# Patient Record
Sex: Female | Born: 1935 | Race: White | Hispanic: No | Marital: Married | Smoking: Never smoker
Health system: Southern US, Community
[De-identification: ages and names within clinical notes are randomized; demographics above are authoritative.]

## PROBLEM LIST (undated history)

## (undated) DIAGNOSIS — K219 Gastro-esophageal reflux disease without esophagitis: Secondary | ICD-10-CM

## (undated) DIAGNOSIS — B019 Varicella without complication: Secondary | ICD-10-CM

## (undated) DIAGNOSIS — I10 Essential (primary) hypertension: Secondary | ICD-10-CM

## (undated) DIAGNOSIS — M1990 Unspecified osteoarthritis, unspecified site: Secondary | ICD-10-CM

## (undated) HISTORY — DX: Varicella without complication: B01.9

## (undated) HISTORY — PX: COLONOSCOPY: SHX174

## (undated) HISTORY — DX: Gastro-esophageal reflux disease without esophagitis: K21.9

## (undated) HISTORY — DX: Essential (primary) hypertension: I10

## (undated) HISTORY — PX: ESOPHAGOGASTRODUODENOSCOPY: SHX1529

## (undated) HISTORY — DX: Unspecified osteoarthritis, unspecified site: M19.90

---

## 1999-05-17 ENCOUNTER — Encounter: Admission: RE | Admit: 1999-05-17 | Discharge: 1999-05-17 | Payer: Self-pay | Admitting: Sports Medicine

## 1999-06-17 ENCOUNTER — Encounter: Admission: RE | Admit: 1999-06-17 | Discharge: 1999-06-17 | Payer: Self-pay | Admitting: Sports Medicine

## 1999-07-29 ENCOUNTER — Other Ambulatory Visit: Admission: RE | Admit: 1999-07-29 | Discharge: 1999-07-29 | Payer: Self-pay | Admitting: Family Medicine

## 2000-03-03 ENCOUNTER — Encounter: Admission: RE | Admit: 2000-03-03 | Discharge: 2000-03-03 | Payer: Self-pay | Admitting: Family Medicine

## 2000-07-17 ENCOUNTER — Encounter: Admission: RE | Admit: 2000-07-17 | Discharge: 2000-07-17 | Payer: Self-pay | Admitting: Family Medicine

## 2000-07-17 ENCOUNTER — Encounter: Payer: Self-pay | Admitting: Family Medicine

## 2000-07-31 ENCOUNTER — Other Ambulatory Visit: Admission: RE | Admit: 2000-07-31 | Discharge: 2000-07-31 | Payer: Self-pay | Admitting: Family Medicine

## 2001-08-16 ENCOUNTER — Encounter: Payer: Self-pay | Admitting: Family Medicine

## 2001-08-16 ENCOUNTER — Encounter: Admission: RE | Admit: 2001-08-16 | Discharge: 2001-08-16 | Payer: Self-pay | Admitting: Family Medicine

## 2002-01-30 ENCOUNTER — Other Ambulatory Visit: Admission: RE | Admit: 2002-01-30 | Discharge: 2002-01-30 | Payer: Self-pay | Admitting: Family Medicine

## 2002-09-25 ENCOUNTER — Encounter: Admission: RE | Admit: 2002-09-25 | Discharge: 2002-09-25 | Payer: Self-pay | Admitting: Family Medicine

## 2002-09-25 ENCOUNTER — Encounter: Payer: Self-pay | Admitting: Family Medicine

## 2003-07-10 ENCOUNTER — Other Ambulatory Visit: Admission: RE | Admit: 2003-07-10 | Discharge: 2003-07-10 | Payer: Self-pay | Admitting: Family Medicine

## 2003-12-11 ENCOUNTER — Encounter: Admission: RE | Admit: 2003-12-11 | Discharge: 2003-12-11 | Payer: Self-pay | Admitting: Family Medicine

## 2004-08-31 ENCOUNTER — Other Ambulatory Visit: Admission: RE | Admit: 2004-08-31 | Discharge: 2004-08-31 | Payer: Self-pay | Admitting: Family Medicine

## 2005-05-18 ENCOUNTER — Encounter: Admission: RE | Admit: 2005-05-18 | Discharge: 2005-05-18 | Payer: Self-pay | Admitting: Family Medicine

## 2006-07-04 ENCOUNTER — Encounter: Admission: RE | Admit: 2006-07-04 | Discharge: 2006-07-04 | Payer: Self-pay | Admitting: Family Medicine

## 2006-07-25 ENCOUNTER — Other Ambulatory Visit: Admission: RE | Admit: 2006-07-25 | Discharge: 2006-07-25 | Payer: Self-pay | Admitting: Family Medicine

## 2007-01-02 ENCOUNTER — Encounter: Admission: RE | Admit: 2007-01-02 | Discharge: 2007-01-02 | Payer: Self-pay | Admitting: Family Medicine

## 2007-10-03 ENCOUNTER — Encounter: Admission: RE | Admit: 2007-10-03 | Discharge: 2007-10-03 | Payer: Self-pay | Admitting: Family Medicine

## 2008-10-15 ENCOUNTER — Encounter: Admission: RE | Admit: 2008-10-15 | Discharge: 2008-10-15 | Payer: Self-pay | Admitting: Family Medicine

## 2009-12-01 ENCOUNTER — Encounter: Admission: RE | Admit: 2009-12-01 | Discharge: 2009-12-01 | Payer: Self-pay | Admitting: Family Medicine

## 2010-03-30 ENCOUNTER — Ambulatory Visit: Payer: Self-pay | Admitting: Family Medicine

## 2010-03-30 DIAGNOSIS — M546 Pain in thoracic spine: Secondary | ICD-10-CM | POA: Insufficient documentation

## 2010-07-14 ENCOUNTER — Ambulatory Visit: Payer: Self-pay | Admitting: Sports Medicine

## 2010-07-14 DIAGNOSIS — M79609 Pain in unspecified limb: Secondary | ICD-10-CM

## 2010-07-14 DIAGNOSIS — M7750 Other enthesopathy of unspecified foot: Secondary | ICD-10-CM | POA: Insufficient documentation

## 2010-07-14 DIAGNOSIS — M21619 Bunion of unspecified foot: Secondary | ICD-10-CM

## 2010-08-12 ENCOUNTER — Ambulatory Visit: Payer: Self-pay | Admitting: Sports Medicine

## 2010-08-12 ENCOUNTER — Encounter: Payer: Self-pay | Admitting: Sports Medicine

## 2010-09-09 ENCOUNTER — Ambulatory Visit: Payer: Self-pay | Admitting: Sports Medicine

## 2010-12-07 ENCOUNTER — Other Ambulatory Visit: Payer: Self-pay | Admitting: Family Medicine

## 2010-12-07 DIAGNOSIS — Z1239 Encounter for other screening for malignant neoplasm of breast: Secondary | ICD-10-CM

## 2010-12-07 NOTE — Assessment & Plan Note (Signed)
Summary: ORTHOTICS PER Jan Olano,MC   Vital Signs:  Patient profile:   75 year old female BP sitting:   157 / 84  Vitals Entered By: Lillia Pauls CMA (September 09, 2010 10:26 AM)  History of Present Illness: Katie Johnson returns for pain in both feet her old [employer]k orthotics are wearing down addition of MT support on RT helped a lot still these are not enough support for tennis  both bunions hurt p tennis even in older orthoitcs  wants to try custom orthotic  Allergies: No Known Drug Allergies  Physical Exam  General:  Well-developed,well-nourished,in no acute distress; alert,appropriate and cooperative throughout examination Msk:  marked bunions bilat abnormal clluses see as noted below:   Left foot: Marked breakdown of long and transverse arches. Large bunion, also hammering of 2nd toe. Mild amount of swelling over 2nd TMT joint, area also TTP.  Minimal TTP along 2nd today. - Rt foot: breakdown of long & transverse arches. Large bunion.  No significant TTP along tarsals or MTs.   Impression & Recommendations:  Problem # 1:  FOOT PAIN, BILATERAL (ICD-729.5)  will make custom orthotics she has gotten some relief w all interventions we have tried this may let her play tennis and walk w less probs  Patient was fitted for a standard, cushioned, semi-rigid orthotic.  The orthotic was heated and the patient stood on the orthotic blank positioned on the orthotic stand. The patient was positioned in subtalar neutral position and 10 degrees of ankle dorsiflexion in a weight bearing stance. After completion of molding a stable based was applied to the orthotic blank.   The blank was ground to a stable position for weight bearing. size 7 blue swirl base  blue EVA posting  none additional orthotic padding  MT pads  40 mins time  Orders: [employer], each unit (L3002)  Problem # 2:  BUNIONS, BILATERAL (ICD-727.1)  posting in supination to lessen pressure over bunions  bilat  Orders: [employer], each unit (L3002)  Problem # 3:  METATARSALGIA (ICD-726.70)  small MT pads added to each orthotic seem to help lessen forefoot pain  Orders: [employer], each unit (L3002)  reck prin  Complete Medication List: 1)  Neurontin 300 Mg Caps (Gabapentin) .Marland Kitchen.. 1 cap by mouth at bedtime 2)  Tramadol Hcl 50 Mg Tabs (Tramadol hcl) .Marland Kitchen.. 1 tab by mouth three times a day as needed pain   Orders Added: 1)  Est. Patient Level IV [24401] 2)  Orthotic Materials, each unit [L3002]

## 2010-12-07 NOTE — Assessment & Plan Note (Signed)
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 - playing several days in a row  Allergies: No Known Drug Allergies  Physical Exam  General:  Well-developed,well-nourished,in no acute distress; alert,appropriate and cooperative throughout examination Msk:  Marked breakdwon of long and transverse arches LT shows a large bunion w displacement left 2nd hammer toe RT shows a fairly large bunion left hammer  left forefoot shows swelling and TTP directly over 2nd MT shaft distal 1/3 some general swelling at MTPs also TMT DJD is noted Additional Exam:  MSK [place] left foot shows soft tissue swelling there is DJD in TMT and MT joints w effusions noted over shaft there is edema/ no cortical thickening on long view but cap sign on trans view no inc in doppler noted this is area of Max TTP  images saved   Impression & Recommendations:  Problem # 1:  FOOT PAIN, BILATERAL (ICD-729.5)  cont in orthotics as these look good  most pain is now on left and over bunions when shoes are not good  Orders: [place] LIMITED (16109) Foot Orthosis ( Arch Strap/Heel Cup) [phone])  Problem # 2:  METATARSALGIA (ICD-726.70)  left 2nd probably has an early stress rxn on 2nd MT without clear evidence of stress fx at this point  trial with adding arch strap added neuroma pad to orthotics  both these felt like they helped relieve pain  reck 4 wks  Orders: [place] LIMITED (09811) Foot Orthosis ( Arch  Strap/Heel Cup) (B1478)  Problem # 3:  BUNIONS, BILATERAL (ICD-727.1) severe but with limited pain not sure surgery will be great benefit  Complete Medication List: 1)  Neurontin 300 Mg Caps (Gabapentin) .Marland Kitchen.. 1 cap by mouth at bedtime 2)  Tramadol Hcl 50 Mg Tabs (Tramadol hcl) .Marland Kitchen.. 1 tab by mouth three times a day as needed pain  Patient Instructions: 1)  Make sure you get 800 International Units Vit D once daily 2)  make sure you get 1500 mgm calcium daily 3)  get 500 mgm of vit C daily 4)  use arch strap over Left foot for 6 weeks as tolerated 5)  use orthotics when possible 6)  ice foot at end of day 7)  wait 2 weeks before trying any tennis 8)  use bike for x training 9)  reck in 1 month

## 2010-12-07 NOTE — Assessment & Plan Note (Signed)
Summary: R thoracic back pain   Vital Signs:  Patient profile:   75 year old female Height:      65 inches Weight:      155 pounds BMI:     25.89 BP sitting:   159 / 76  Vitals Entered By: Lillia Pauls CMA (Mar 30, 2010 4:06 PM)  History of Present Illness: 75 yo F presents with right sided mid-upper back pain  Patient denies any known injury Pain started about a month ago when golfing but no particular moment of injury Pain is only on right side just medial to scapula. Some association with numbness and tingling over this area and laterally Feels spasm in this area also that is better with activity Worse first thing in morning and after resting after activity Notices when she serves in tennis Tried icing and ibuprofen but this hurts her stomach Husband has some voltaren gel - has tried this which helps some Massage also helping. Not tried any specific stretches/exercises.  Allergies (verified): No Known Drug Allergies  Physical Exam  General:  Well-developed,well-nourished,in no acute distress; alert,appropriate and cooperative throughout examination Msk:  Back: No gross deformity, swelling, bruising.  Some spasm right paraspinal thoracic region just medial to scapula. TTP in this area only. FROM upper extremities. Strength 5/5 BUEs. Reflexes 2+ and equal bilaterally No loss of sensation bilaterally.   Impression & Recommendations:  Problem # 1:  BACK PAIN, THORACIC REGION (ICD-724.1) Assessment New Focus on scapular strengthening and stretches - shown some today and handout provided.  Heat instead of ice.  Massage and can continue to use voltaren gel.  Neurontin at bedtime for nerve irritation/numbness/spasm.  Will allow her to sleep better as well.  Tramadol if she needs something for pain.  Consider PT at f/u in 4-6 weeks if not improving.  No restrictions on activities.  Her updated medication list for this problem includes:    Tramadol Hcl 50 Mg Tabs (Tramadol  hcl) .Marland Kitchen... 1 tab by mouth three times a day as needed pain  Complete Medication List: 1)  Neurontin 300 Mg Caps (Gabapentin) .Marland Kitchen.. 1 cap by mouth at bedtime 2)  Tramadol Hcl 50 Mg Tabs (Tramadol hcl) .Marland Kitchen.. 1 tab by mouth three times a day as needed pain  Patient Instructions: 1)  Use heat for 15 minutes at a time 3-4 times a day over the area. 2)  Continue using the voltaren gel up to 4 times a day. 3)  Massage will help break up the spasm. 4)  Try neurontin 300mg  at bedtime only to help block the nerve irritation you are getting with this. 5)  Stretching is important - do stretches where you feel it in this area and hold for 20-30 seconds - do 3 of these at least once a day. 6)  Bring shoulder blades together to strengthen these muscles - do 3 sets of 10 once a day. 7)  Physical therapy is an option if you are not making headway with home exercises and stretches. 8)  Follow up with [place] in 4-6 weeks to see how you are doing with this. Prescriptions: TRAMADOL HCL 50 MG TABS (TRAMADOL HCL) 1 tab by mouth three times a day as needed pain  #90 x 1   Entered and Authorized by:   Norton Blizzard MD   Signed by:   Norton Blizzard MD on 03/30/2010   Method used:   Print then Give to Patient   RxID:   [id number] NEURONTIN 300 MG CAPS (GABAPENTIN)  1 cap by mouth at bedtime  #30 x 2   Entered and Authorized by:   Norton Blizzard MD   Signed by:   Norton Blizzard MD on 03/30/2010   Method used:   Print then Give to Patient   RxID:   [id number]

## 2010-12-07 NOTE — Assessment & Plan Note (Signed)
Summary: f/u feet pain   Vital Signs:  Patient profile:   75 year old female Pulse rate:   66 / minute BP sitting:   155 / 81  (right arm)  Vitals Entered By: Rochele Pages RN (August 12, 2010 9:43 AM) CC: f/u bilat foot pain- improved 80%   CC:  f/u bilat foot pain- improved 80%.  History of Present Illness: 75yo female to office for f/u foot pain.  Diagnosed with possible early stress rxn/early stress fx of L 2nd MT.  Unable to wear arch strap b/c caused too much pain.  Unable to tolerate tramadol b/c makes her unsteady on her feet.  Has been taking ibuprofen as needed which seems to help.  Feels she is 73-80% better.  Still with some pain in L foot at night.  Pain now more along 2nd TMT joint, some associated swelling in this area.  Has played tennis twice in the last 2-weeks with only a little pain.  Icing after activity.  Neuroma pads still feel comfortable in her shoes.  Allergies: No Known Drug Allergies  Review of Systems      See HPI  Physical Exam  General:  Well-developed,well-nourished,in no acute distress; alert,appropriate and cooperative throughout examination Msk:  FEET:   - Left foot: Marked breakdown of long and transverse arches. Large bunion, also hammering of 2nd toe. Mild amount of swelling over 2nd TMT joint, area also TTP.  Minimal TTP along 2nd today. - Rt foot: breakdown of long & transverse arches. Large bunion.  No significant TTP along tarsals or MTs.  GAIT:  no limp.  pronation bilaterally. Pulses:  +2/4 DP & PT Neurologic:  sensation intact to light touch.   Additional Exam:  MSK U/S:  Lt foot - small amount of soft tissue swelling noted over 2nd TMT joint.  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  No edema or cortical thickening along 2nd MT.  Spur noted at 2nd MTP joint.  No signs of stress fx or stress rxn today.  No increased doppler flow.  Images saved.   Impression & Recommendations:  Problem # 1:  FOOT PAIN, BILATERAL  (ICD-729.5)  - Continue to use insoles with neuroma pads. - Reviewed proper footwear, as large bunions also contributing to foot pain - Will schedule for custom orthotics for better support while she plays tennis  Orders: Foot Orthosis ( Arch Strap/Heel Cup) [phone])  Problem # 2:  METATARSALGIA (ICD-726.70)  - No signs of stress fx or stress rxn noted on MSK u/s today. - 3 pair of patient's insoles fitted with neuroma pads on left for added support - Will schedule for custom orthotics with leather blank to be placed in her tennis shoes.   - Continue to ice after workouts.  Cont. ibuprofen prn  Orders: Foot Orthosis ( Arch Strap/Heel Cup) [phone])  Problem # 3:  BUNIONS, BILATERAL (ICD-727.1)  - Again reviewed proper footwear.  Orders: Foot Orthosis ( Arch Strap/Heel Cup) [phone])  Complete Medication List: 1)  Neurontin 300 Mg Caps (Gabapentin) .Marland Kitchen.. 1 cap by mouth at bedtime 2)  Tramadol Hcl 50 Mg Tabs (Tramadol hcl) .Marland Kitchen.. 1 tab by mouth three times a day as needed pain

## 2010-12-28 ENCOUNTER — Ambulatory Visit
Admission: RE | Admit: 2010-12-28 | Discharge: 2010-12-28 | Disposition: A | Discharge status: Home / Self Care | Payer: Medicare Other | Source: Ambulatory Visit | Attending: Family Medicine | Admitting: Family Medicine

## 2010-12-28 DIAGNOSIS — Z1239 Encounter for other screening for malignant neoplasm of breast: Secondary | ICD-10-CM

## 2011-07-26 ENCOUNTER — Ambulatory Visit: Payer: BC Managed Care – PPO | Admitting: Sports Medicine

## 2011-08-02 ENCOUNTER — Ambulatory Visit: Payer: BC Managed Care – PPO | Admitting: Sports Medicine

## 2011-09-15 ENCOUNTER — Ambulatory Visit (INDEPENDENT_AMBULATORY_CARE_PROVIDER_SITE_OTHER): Payer: Medicare Other | Admitting: Sports Medicine

## 2011-09-15 VITALS — BP 130/80

## 2011-09-15 DIAGNOSIS — M65849 Other synovitis and tenosynovitis, unspecified hand: Secondary | ICD-10-CM

## 2011-09-15 DIAGNOSIS — M654 Radial styloid tenosynovitis [de Quervain]: Secondary | ICD-10-CM

## 2011-09-15 DIAGNOSIS — M659 Synovitis and tenosynovitis, unspecified: Secondary | ICD-10-CM

## 2011-09-15 DIAGNOSIS — M7750 Other enthesopathy of unspecified foot: Secondary | ICD-10-CM

## 2011-09-15 DIAGNOSIS — S6990XA Unspecified injury of unspecified wrist, hand and finger(s), initial encounter: Secondary | ICD-10-CM

## 2011-09-15 DIAGNOSIS — S66809A Unspecified injury of other specified muscles, fascia and tendons at wrist and hand level, unspecified hand, initial encounter: Secondary | ICD-10-CM

## 2011-09-15 MED ORDER — KETOPROFEN POWD: 1.0000 "application " | Freq: Four times a day (QID) | Status: DC | PRN

## 2011-09-15 NOTE — Assessment & Plan Note (Addendum)
A:  De Quervain's tenosynovitis on the R side due to overuse.  P: R wrist loop  to be worn during activity (tennis and bell ringing).  ketoprofen gel TID-QID prn. F/u in 2-3 weeks.

## 2011-09-15 NOTE — Patient Instructions (Signed)
You have DeQuervain's Tenosynovitis  Please use wrist loop for bell ringing or when playing tennis.  It is also ok to wear the brace anytime your wrist is bothering you.  Rub Ketoprofen gel 3-4 times per day on your wrist.  Please follow up with [place] at your convenience to make your new orthotics.

## 2011-09-15 NOTE — Assessment & Plan Note (Signed)
A: improved with consistent use of orthotics.  P: f/u in 2-3 weeks for custom orthotics to wear with dress shoes.

## 2011-09-15 NOTE — Progress Notes (Signed)
  Subjective:    Patient ID: Katie Johnson, female    DOB: 1936/04/17, 75 y.o.   MRN: [id number]  HPI Subjective:    Katie Johnson is an 75 y.o. female who presents for evaluation of right wrist pain. She is R handed. Onset of pain was gradual, starting about 1 year ago. The pain is moderate, worsens with tennis and bell ringing, and is relieved by rest and ibuprofen. There is no associated numbness, tingling, weakness in the hand or wrist. There is no history of injury. There is history of overuse. The patient plays tennis 2x/week. She notices pain during her backhand. She performs her backhand with her thumb extended. Evaluation to date: none. Treatment to date: tramadol which patient no longer takes due to dizziness while taking the medication.   Patient also request another set of orthotics for her dress shoes. She is wearing her orthotics for her tennis shoes which she states have worked out very well. She denies foot pain.   The following portions of the patient's history were reviewed and updated as appropriate: allergies, current medications, past medical history, past social history and problem list.  Review of Systems Constitutional: negative for anorexia, chills, fevers and weight loss   Objective:    BP 130/80 Right wrist:  No bruising. Soft tissue tenderness and swelling at compartment one. Full ROM. Pain illicited by thumb and wrist flexion. Positive Finkelstein's test. Radial pulse normal.    Left wrist:  normal exam, no swelling, tenderness, instability; ligaments intact, full ROM both hands, wrists, and finger joints   Assessment:    De Quervain's tenosynovitis.  on the right side  due to overuse.   Plan:    R wrist loop to be worn during activity (tennis and bell ringing). . NSAIDs per medication orders.    Review of Systems     Objective:   Physical Exam        Assessment & Plan:

## 2011-10-05 ENCOUNTER — Encounter: Payer: Self-pay | Admitting: Sports Medicine

## 2011-10-05 ENCOUNTER — Ambulatory Visit (INDEPENDENT_AMBULATORY_CARE_PROVIDER_SITE_OTHER): Payer: Medicare Other | Admitting: Sports Medicine

## 2011-10-05 VITALS — BP 150/86 | HR 71

## 2011-10-05 DIAGNOSIS — M79609 Pain in unspecified limb: Secondary | ICD-10-CM

## 2011-10-05 DIAGNOSIS — M21619 Bunion of unspecified foot: Secondary | ICD-10-CM

## 2011-10-05 DIAGNOSIS — M654 Radial styloid tenosynovitis [de Quervain]: Secondary | ICD-10-CM

## 2011-10-05 DIAGNOSIS — M7750 Other enthesopathy of unspecified foot: Secondary | ICD-10-CM

## 2011-10-05 NOTE — Assessment & Plan Note (Signed)
New orthotics were formed for her additional sports shoes and walking gait was corrected to neutral and comfortable after these were completed.  Patient was fitted for a standard, cushioned, semi-rigid orthotic.  The orthotic was heated and the patient stood on the orthotic blank positioned on the orthotic stand. The patient was positioned in subtalar neutral position and 10 degrees of ankle dorsiflexion in a weight bearing stance. After molding, a stable [employer] EVA base was applied to the orthotic blank.   The blank was ground to a stable position for weight bearing. Size: 8 blue swirl base: Blue EVA posting: MT pads additional orthotic padding: none  Time 45 mins

## 2011-10-05 NOTE — Assessment & Plan Note (Signed)
If this continues to control her pain well she can return when necessary or when she needs replacement of her orthotics

## 2011-10-05 NOTE — Assessment & Plan Note (Signed)
We fitted metatarsal pads and multiple pairs of shoes and she was able to get good comfort with walking in these.

## 2011-10-05 NOTE — Patient Instructions (Signed)
Continue using MT pads  Use orthotics in all sports shoes and shoes that will allow fit We made ketoprofen gel refillable for 1 year Prn return

## 2011-10-05 NOTE — Assessment & Plan Note (Signed)
Much improved today but I would recommend that she continue using the topical ketoprofen and the wrist loop for at least the next 4 weeks

## 2011-10-05 NOTE — Progress Notes (Signed)
  Subjective:    Patient ID: Katie Johnson, female    DOB: [birth year], 75 y.o.   MRN: [id number]  HPI Patient returns for 2 issues but primarily for new orthotics. Her bilateral bunions are painful in all of her shoes she wears orthotics. In addition she finds that metatarsal pads are necessary for her to be able to wear any type of dress shoe wear she will get persistent forefoot pain.  We saw her recently with dequervain's tenosynovitis. She has been using a wrist loop and topical ketoprofen gel. This has relieved all of her pain and she is back to playing tennis regularly.   Review of Systems     Objective:   Physical Exam  No acute distress  Right wrist is now nonpainful to palpation and there is no swelling over the first compartment. She can do full wrist motion and a Finkelstein's test without pain.  Foot reveals significant bilateral bunions. She has collapse of the transverse arch and abnormal calcium, metatarsal areas. Walking gait is significantly pronated without orthotics in place. The longitudinal arch shows moderate collapse.      Assessment & Plan:

## 2012-09-18 ENCOUNTER — Ambulatory Visit (INDEPENDENT_AMBULATORY_CARE_PROVIDER_SITE_OTHER): Payer: Medicare Other | Admitting: Sports Medicine

## 2012-09-18 ENCOUNTER — Encounter: Payer: Self-pay | Admitting: Sports Medicine

## 2012-09-18 VITALS — BP 126/77 | HR 59

## 2012-09-18 DIAGNOSIS — M21619 Bunion of unspecified foot: Secondary | ICD-10-CM

## 2012-09-18 DIAGNOSIS — M7750 Other enthesopathy of unspecified foot: Secondary | ICD-10-CM

## 2012-09-18 NOTE — Progress Notes (Signed)
Patient ID: Katie Johnson, female   DOB: [birth year], 76 y.o.   MRN: [id number]  Patient with more RT forefoot pain This follows tennis tournament and standing to play hand bells Wears orthotics in all shoes Tries MT pads in dress shoes  Also some RT lateral post thigh pain p tennis Feeling better with some stretches  PE Marked bunions bilat On Rt tender at medial arch tendr over bunion MT area ttp bilat  Both long and transverse arch are collapsed

## 2012-09-18 NOTE — Assessment & Plan Note (Signed)
Added some heel wedging to RT to lessen stress along med arch and bunion  Felt less pain w this

## 2012-09-18 NOTE — Assessment & Plan Note (Signed)
On RT foot I expanded to use MT cookie Cont med MT pad on left  dress shoes also padded  Much less pain with this

## 2012-11-30 ENCOUNTER — Other Ambulatory Visit: Payer: Self-pay | Admitting: Family Medicine

## 2012-11-30 DIAGNOSIS — Z1231 Encounter for screening mammogram for malignant neoplasm of breast: Secondary | ICD-10-CM

## 2013-01-02 ENCOUNTER — Ambulatory Visit
Admission: RE | Admit: 2013-01-02 | Discharge: 2013-01-02 | Disposition: A | Discharge status: Home / Self Care | Payer: Medicare Other | Source: Ambulatory Visit | Attending: Family Medicine | Admitting: Family Medicine

## 2013-04-08 ENCOUNTER — Emergency Department (HOSPITAL_BASED_OUTPATIENT_CLINIC_OR_DEPARTMENT_OTHER)
Admission: EM | Admit: 2013-04-08 | Discharge: 2013-04-08 | Disposition: A | Discharge status: Home / Self Care | Payer: Medicare Other | Attending: Emergency Medicine | Admitting: Emergency Medicine

## 2013-04-08 ENCOUNTER — Encounter (HOSPITAL_BASED_OUTPATIENT_CLINIC_OR_DEPARTMENT_OTHER): Payer: Self-pay | Admitting: *Deleted

## 2013-04-08 ENCOUNTER — Emergency Department (HOSPITAL_BASED_OUTPATIENT_CLINIC_OR_DEPARTMENT_OTHER): Payer: Medicare Other

## 2013-04-08 DIAGNOSIS — R002 Palpitations: Secondary | ICD-10-CM

## 2013-04-08 DIAGNOSIS — R079 Chest pain, unspecified: Secondary | ICD-10-CM

## 2013-04-08 DIAGNOSIS — I10 Essential (primary) hypertension: Secondary | ICD-10-CM | POA: Insufficient documentation

## 2013-04-08 DIAGNOSIS — Z79899 Other long term (current) drug therapy: Secondary | ICD-10-CM | POA: Insufficient documentation

## 2013-04-08 DIAGNOSIS — R0789 Other chest pain: Secondary | ICD-10-CM | POA: Insufficient documentation

## 2013-04-08 HISTORY — DX: Essential (primary) hypertension: I10

## 2013-04-08 LAB — CBC WITH DIFFERENTIAL/PLATELET
Eosinophils Relative: 3 % (ref 0–5)
Lymphocytes Relative: 38 % (ref 12–46)
Lymphs Abs: 3.1 10*3/uL (ref 0.7–4.0)
MCH: 33.3 pg (ref 26.0–34.0)
MCHC: 35.4 g/dL (ref 30.0–36.0)
MCV: 94 fL (ref 78.0–100.0)
Monocytes Relative: 9 % (ref 3–12)
Neutro Abs: 4.1 10*3/uL (ref 1.7–7.7)
Platelets: 173 10*3/uL (ref 150–400)
RBC: 4.36 MIL/uL (ref 3.87–5.11)
RDW: 12.3 % (ref 11.5–15.5)
WBC: 8.2 10*3/uL (ref 4.0–10.5)

## 2013-04-08 LAB — BASIC METABOLIC PANEL
CO2: 20 mEq/L (ref 19–32)
Chloride: 100 mEq/L (ref 96–112)
Creatinine, Ser: 0.8 mg/dL (ref 0.50–1.10)
Glucose, Bld: 125 mg/dL — ABNORMAL HIGH (ref 70–99)
Potassium: 3.7 mEq/L (ref 3.5–5.1)

## 2013-04-08 LAB — TROPONIN I: Troponin I: <0.3 ng/mL (ref <0.30)

## 2013-04-08 NOTE — ED Provider Notes (Signed)
History    This chart was scribed for Dione Booze, MD by Donne Anon, ED Scribe. This patient was seen in room MH07/MH07 and the patient's care was started at 1840.   CSN: [id number]  Arrival date & time 04/08/13  4782   First MD Initiated Contact with Patient 04/08/13 1840      Chief Complaint  Patient presents with  . Chest Pain    The history is provided by the patient. No language interpreter was used.   HPI Comments: Katie Johnson is a 77 y.o. female with hx HTN, who presents to the Emergency Department complaining of sudden onset  CP (rated 5/10) that radiates to her left ribs and shoulder and is described as pressure and has since resolved. She states the episodes began at 5:30 PM and lasted approximately 1 hour. She denies any modifying factors. She took an Aspirin at the time of the episode. She states her pulse rate was 157 (measured on a at home blood pressure device), but her BP was normal. Her chest pain/pressure resolved when her heart stopped racing. She reports associated flatulence. She states she felt like she had to take deep breaths in order to "get air in." She denies nausea, vomiting, diaphoresis or any other pain. She denies smoking but reports she occasionally drinks wine. She states she is otherwise healthy.  Her PCP is Dr. Everlene Other.   No past medical history on file.  No past surgical history on file.  No family history on file.  History  Substance Use Topics  . Smoking status: Never Smoker   . Smokeless tobacco: Not on file  . Alcohol Use: Not on file     Review of Systems  Constitutional: Negative for chills and diaphoresis.  Cardiovascular: Positive for chest pain.  Gastrointestinal: Negative for nausea and vomiting.  All other systems reviewed and are negative.    Allergies  Ketoprofen  Home Medications   Current Outpatient Rx  Name  Route  Sig  Dispense  Refill  . amLODipine-benazepril (LOTREL) 5-10 MG per capsule   Oral   Take 1 tablet  by mouth daily.         . fish oil-omega-3 fatty acids 1000 MG capsule   Oral   Take 1 g by mouth daily.           . hydrochlorothiazide (HYDRODIURIL) 25 MG tablet   Oral   Take 12.5 mg by mouth daily.         . Ketoprofen POWD   Does not apply   1 application by Does not apply route 4 (four) times daily as needed. 20% gel to use TID-QID. Apply to affected area of R wrist.   60 g   prn   . vitamin E 400 UNIT capsule   Oral   Take 400 Units by mouth daily.             BP 162/85  Pulse 87  Temp(Src) 98 F (36.7 C) (Oral)  Resp 18  Ht 5\' 5"  (1.651 m)  Wt 158 lb (71.668 kg)  BMI 26.29 kg/m2  SpO2 97%  Physical Exam  Nursing note and vitals reviewed. Constitutional: She is oriented to person, place, and time. She appears well-developed and well-nourished. No distress.  HENT:  Head: Normocephalic and atraumatic.  Eyes: EOM are normal.  Neck: Neck supple. No tracheal deviation present.  Cardiovascular: Normal rate, regular rhythm and normal heart sounds.   Pulmonary/Chest: Effort normal and breath sounds normal. No respiratory  distress.  Abdominal: Soft.  Musculoskeletal: Normal range of motion.  Neurological: She is alert and oriented to person, place, and time.  Skin: Skin is warm and dry.  Psychiatric: She has a normal mood and affect. Her behavior is normal.    ED Course  Procedures (including critical care time) DIAGNOSTIC STUDIES: Oxygen Saturation is 97% on RA, adequate by my interpretation.    COORDINATION OF CARE: 7:01 PM Discussed treatment plan which includes labs and CXR with pt at bedside and pt agreed to plan.   Results for orders placed during the hospital encounter of 04/08/13  CBC WITH DIFFERENTIAL      Result Value Range   WBC 8.2  4.0 - 10.5 K/uL   RBC 4.36  3.87 - 5.11 MIL/uL   Hemoglobin 14.5  12.0 - 15.0 g/dL   HCT 16.1  09.6 - 04.5 %   MCV 94.0  78.0 - 100.0 fL   MCH 33.3  26.0 - 34.0 pg   MCHC 35.4  30.0 - 36.0 g/dL   RDW 40.9   81.1 - 91.4 %   Platelets 173  150 - 400 K/uL   Neutrophils Relative % 50  43 - 77 %   Neutro Abs 4.1  1.7 - 7.7 K/uL   Lymphocytes Relative 38  12 - 46 %   Lymphs Abs 3.1  0.7 - 4.0 K/uL   Monocytes Relative 9  3 - 12 %   Monocytes Absolute 0.7  0.1 - 1.0 K/uL   Eosinophils Relative 3  0 - 5 %   Eosinophils Absolute 0.3  0.0 - 0.7 K/uL   Basophils Relative 0  0 - 1 %   Basophils Absolute 0.0  0.0 - 0.1 K/uL  BASIC METABOLIC PANEL      Result Value Range   Sodium 137  135 - 145 mEq/L   Potassium 3.7  3.5 - 5.1 mEq/L   Chloride 100  96 - 112 mEq/L   CO2 20  19 - 32 mEq/L   Glucose, Bld 125 (*) 70 - 99 mg/dL   BUN 27 (*) 6 - 23 mg/dL   Creatinine, Ser 7.82  0.50 - 1.10 mg/dL   Calcium 9.7  8.4 - 95.6 mg/dL   GFR calc non Af Amer 71 (*) >90 mL/min   GFR calc Af Amer 82 (*) >90 mL/min  TROPONIN I      Result Value Range   Troponin I <0.30  <0.30 ng/mL  TROPONIN I      Result Value Range   Troponin I <0.30  <0.30 ng/mL   Dg Chest 2 View  04/08/2013   *RADIOLOGY REPORT*  Clinical Data: Chest pain, history of hypertension  CHEST - 2 VIEW  Comparison: None.  Findings: Mild pulmonary hyperexpansion with central bronchitic changes of bilateral upper lobe emphysema.  Cardiac and mediastinal contours within normal limits.  Multilevel degenerative disc changes with exaggerated kyphosis in the mid thoracic spine.  No pulmonary vascular congestion.  Right middle lobe scarring and possible bronchiectasis.  IMPRESSION:  1.  Right middle lobe scarring and probable mild bronchiectasis. While nonspecific, similar findings can be seen in indolent atypical infection such as MAI. 2.  COPD and emphysema   Original Report Authenticated By: Malachy Moan, M.D.      Date: 04/08/2013  Rate: 87  Rhythm: normal sinus rhythm  QRS Axis: normal  Intervals: normal  ST/T Wave abnormalities: normal  Conduction Disutrbances:none  Narrative Interpretation: Left atrial hypertrophy. No prior ECG available for  comparison.  Old EKG Reviewed: none available    1. Chest pain   2. Palpitations       MDM  Chest pain which seems to have been related to an episode of tachycardia-most likely paroxysmal supraventricular tachycardia. Unfortunately, definite diagnosis cannot be made without ECG tracing during episode of tachycardia. She'll be kept in the ED to get a 0 three-hour troponin. She is at very low risk of ACS. I anticipate that she will be referred back to her PCP to consider Holter monitor or event monitor as an outpatient.  Repeat troponin is normal. She will be discharged.  I personally performed the services described in this documentation, which was scribed in my presence. The recorded information has been reviewed and is accurate.         Dione Booze, MD 04/08/13 [phone]

## 2013-04-08 NOTE — ED Notes (Signed)
Pt reports she never experienced pain but did fell chest pressure that radiated to left rib and shoulder areas. Currently denies any discomfort.

## 2013-04-08 NOTE — ED Notes (Signed)
Pt to room 7 in w/c, reports chest "heaviness and pressure" x 5:30 this evening. Denies any nausea, sob or diaphoresis. ekg has already been performed. Rt obtaining iv access while pt being triaged.  Pt state that "heaviness and pressure" have resolved. None present at this time.

## 2013-04-08 NOTE — ED Notes (Signed)
Pt states she took 325mg  of aspirin just before coming to the ed.

## 2014-07-17 ENCOUNTER — Ambulatory Visit: Payer: Medicare Other | Admitting: Sports Medicine

## 2014-08-07 ENCOUNTER — Ambulatory Visit: Payer: Medicare Other | Admitting: Sports Medicine

## 2014-12-09 ENCOUNTER — Other Ambulatory Visit: Payer: Self-pay

## 2014-12-09 DIAGNOSIS — Z1231 Encounter for screening mammogram for malignant neoplasm of breast: Secondary | ICD-10-CM

## 2014-12-12 ENCOUNTER — Encounter (INDEPENDENT_AMBULATORY_CARE_PROVIDER_SITE_OTHER): Payer: Self-pay

## 2014-12-12 ENCOUNTER — Ambulatory Visit
Admission: RE | Admit: 2014-12-12 | Discharge: 2014-12-12 | Disposition: A | Discharge status: Home / Self Care | Payer: 59 | Source: Ambulatory Visit

## 2014-12-12 DIAGNOSIS — Z1231 Encounter for screening mammogram for malignant neoplasm of breast: Secondary | ICD-10-CM

## 2015-07-15 ENCOUNTER — Encounter: Payer: Self-pay | Admitting: Sports Medicine

## 2015-07-15 ENCOUNTER — Ambulatory Visit (INDEPENDENT_AMBULATORY_CARE_PROVIDER_SITE_OTHER): Payer: Medicare Other | Admitting: Sports Medicine

## 2015-07-15 VITALS — BP 171/85 | Ht 65.0 in | Wt 160.0 lb

## 2015-07-15 DIAGNOSIS — M170 Bilateral primary osteoarthritis of knee: Secondary | ICD-10-CM | POA: Diagnosis not present

## 2015-07-15 MED ORDER — DICLOFENAC SODIUM 1 % TD GEL: 2.0000 g | Freq: Four times a day (QID) | TRANSDERMAL | Status: DC

## 2015-07-15 NOTE — Assessment & Plan Note (Signed)
A: pt. With mild - moderate arthritis of both knees. ROM is preserved. No instability at this time. Pain is controlled with  ibuprofen daily in addition to voltaren gel. Pt. Plays tennis, does yoga, and some stationary cycling. Functionality preserved at this time.  - No injections today.  - Continue ibuprofen / tylenol / voltaren as needed for pain.  - sports knee brace with patellar release while playing tennis - Continue to play tennis but increase to twice weekly to improve quad strength and knee stability.  - Home exercises for knee pain / strengthening given.  - Follow up as needed for pain.

## 2015-07-15 NOTE — Progress Notes (Signed)
[facility] Katie Jolly, MD Phone: [phone]  Subjective:   # Bilateral Knee Pain - pt. Here for evaluation of bilateral knee pain.  - she has previously been diagnosed with arthritis of both of her knees, and received CS injection in 02/2015.  - the injections helped for about 2 months, but her pain has returned.  - She comes in today because she would like to know what to do to preserve her knee functionality in the long run in order to avoid requiring surgical intervention.  - She says that she plays tennis once per week, and her knees do not necessarily bother her or swell after tennis.  - She says that her pain is primarily with lifting her knee, climbing stairs, or getting out of the chair.  - She does not have locking, catching, or giving way of her knees.  - She notices crepitus bilaterally.  - She has pain on the lateral side of both knees, and also patellar pain.  - She has never had an injury to either knee, but her right knee is worse in terms of pain than the left.  - She takes 200mg  of ibuprofen daily which helps to control her pain, and she says that she also uses voltaren gel which also helps her pain.  - She does some swimming, and a small amount of stationary biking, she also takes yoga classes.  - her primary goals are preservation of function of her knees while avoiding surgery if at all possible.   All relevant systems were reviewed and were negative unless otherwise noted in the HPI  Past Medical History Reviewed problem list.  Medications- reviewed and updated Current Outpatient Prescriptions  Medication Sig Dispense Refill  . amLODipine-benazepril (LOTREL) 10-20 MG per capsule Take 1 capsule by mouth daily.    Marland Kitchen amLODipine-benazepril (LOTREL) 5-10 MG per capsule Take 1 tablet by mouth daily.    . diclofenac sodium (VOLTAREN) 1 % GEL Apply 2 g topically 4 (four) times daily. 1 Tube 3  . fish oil-omega-3 fatty acids 1000 MG capsule  Take 1 g by mouth daily.      . hydrochlorothiazide (HYDRODIURIL) 25 MG tablet Take 12.5 mg by mouth daily.    . Ketoprofen POWD 1 application by Does not apply route 4 (four) times daily as needed. 20% gel to use TID-QID. Apply to affected area of R wrist. 60 g prn  . vitamin E 400 UNIT capsule Take 400 Units by mouth daily.       No current facility-administered medications for this visit.   Chief complaint-noted No additions to family history Social history- patient is a non smoker  Objective: BP 171/85 mmHg  Ht 5\' 5"  (1.651 m)  Wt 160 lb (72.576 kg)  BMI 26.63 kg/m2 INSPECTION: No gross deformity, appear to have a small effusion proximally, no erythema.  PALPATION: Tender laterally on both knees, patellar tenderness bilaterally, crepitus noted bilaterally, no gross deformity.  RANGE OF MOTION: 0 - 160 degrees.   STRENGTH: 5/5 flexion, extension, as well as hip flexion / extension.  SPECIAL TESTS: Lachman negative, A/P drawer negative.  NEUROVASCULAR STATUS: In tact.   Ultrasound: Performed on bilateral knees. No evidence of effusion. Small meniscus tear medially / bilaterally. Bone spurring on lateral aspects of bilateral knees. Small amount of fluid / swelling noted near the IT insertion on the right (this is where the majority of her pain is), also noted on the right was bone spurring near  the IT band insertion as well. Patellar grooves are shallow with loss of cartilage noted on bilateral knees.   Assessment/Plan:  Degenerative arthritis of knee, bilateral A: pt. With mild - moderate arthritis of both knees. ROM is preserved. No instability at this time. Pain is controlled with  ibuprofen daily in addition to voltaren gel. Pt. Plays tennis, does yoga, and some stationary cycling. Functionality preserved at this time.  - No injections today.  - Continue ibuprofen / tylenol / voltaren as needed for pain.  - sports knee brace with patellar release while playing tennis -  Continue to play tennis but increase to twice weekly to improve quad strength and knee stability.  - Home exercises for knee pain / strengthening given.  - Follow up as needed for pain.     Reviewed and evaluated with resident/ agree with documentation.  Sterling Big, MD

## 2016-04-27 ENCOUNTER — Other Ambulatory Visit: Payer: Self-pay | Admitting: Family Medicine

## 2016-04-27 DIAGNOSIS — Z1231 Encounter for screening mammogram for malignant neoplasm of breast: Secondary | ICD-10-CM

## 2016-05-03 ENCOUNTER — Ambulatory Visit (HOSPITAL_BASED_OUTPATIENT_CLINIC_OR_DEPARTMENT_OTHER): Payer: 59

## 2016-05-05 ENCOUNTER — Ambulatory Visit (HOSPITAL_BASED_OUTPATIENT_CLINIC_OR_DEPARTMENT_OTHER)
Admission: RE | Admit: 2016-05-05 | Discharge: 2016-05-05 | Disposition: A | Discharge status: Home / Self Care | Payer: Medicare Other | Source: Ambulatory Visit | Attending: Family Medicine | Admitting: Family Medicine

## 2016-05-05 ENCOUNTER — Ambulatory Visit: Payer: 59

## 2016-05-05 DIAGNOSIS — R928 Other abnormal and inconclusive findings on diagnostic imaging of breast: Secondary | ICD-10-CM | POA: Insufficient documentation

## 2016-05-05 DIAGNOSIS — Z1231 Encounter for screening mammogram for malignant neoplasm of breast: Secondary | ICD-10-CM | POA: Diagnosis not present

## 2016-05-09 ENCOUNTER — Other Ambulatory Visit: Payer: Self-pay | Admitting: Family Medicine

## 2016-05-09 DIAGNOSIS — R928 Other abnormal and inconclusive findings on diagnostic imaging of breast: Secondary | ICD-10-CM

## 2016-05-18 ENCOUNTER — Ambulatory Visit
Admission: RE | Admit: 2016-05-18 | Discharge: 2016-05-18 | Disposition: A | Discharge status: Home / Self Care | Payer: Medicare Other | Source: Ambulatory Visit | Attending: Family Medicine | Admitting: Family Medicine

## 2016-05-18 DIAGNOSIS — R928 Other abnormal and inconclusive findings on diagnostic imaging of breast: Secondary | ICD-10-CM

## 2017-09-19 ENCOUNTER — Ambulatory Visit (INDEPENDENT_AMBULATORY_CARE_PROVIDER_SITE_OTHER): Payer: Medicare Other | Admitting: Sports Medicine

## 2017-09-19 VITALS — BP 126/74 | Ht 63.0 in | Wt 163.0 lb

## 2017-09-19 DIAGNOSIS — M170 Bilateral primary osteoarthritis of knee: Secondary | ICD-10-CM

## 2017-09-20 ENCOUNTER — Encounter: Payer: Self-pay | Admitting: Sports Medicine

## 2017-09-20 NOTE — Progress Notes (Signed)
   Subjective:    Patient ID: Katie Johnson, female    DOB: 10/28/1936, 81 y.o.   MRN: [id number]  HPI chief complaint: Bilateral knee pain  Patient comes in today complaining of chronic bilateral knee pain. She has a history of bilateral knee DJD. She's previously [facility] [facility]. He has performed 2 cortisone injections. First injection provided about a year's worth of symptom relief. Last injection was performed this past spring and provided only a few weeks of pain relief. She describes stiffness in both knees which is worse when first getting up. She does note some weakness in her knees as well. She tries to exercise to help keep her legs strong but finds this challenging. She's not noticed any swelling. She denies any locking in the knee. No numbness or tingling. No recent trauma. Previous x-rays have shown left greater than right lateral degenerative changes and moderately severe bilateral patellofemoral degenerative changes.  Past medical history is reviewed Medications reviewed Allergies reviewed     Review of Systems As above     Objective:   Physical Exam  Well-developed, well-nourished. No acute distress. Sitting comfortable in exam room. Vital signs reviewed  Examination of each knee shows significant patellofemoral crepitus bilaterally. 1+ boggy synovitis bilaterally. Minimal tenderness to palpation along the lateral joint lines bilaterally. Range of motion 0-120. Good ligamentous stability. Neurovascularly intact distally.  X-rays are as above. I was able to personally review these x-rays which were done at an outside facility. X-rays are dated 03/15/2017.      Assessment & Plan:  Bilateral knee pain secondary to DJD  I had a long talk with the patient regarding her chronic knee pain. Definitive treatment is a total knee arthroplasty but she is not interested in this. Her husband had a total knee arthroplasty done a few years ago and had a poor  postoperative outcome. I think she may benefit from Visco supplementation. She would like to meet with Dr. Terrilee FilesZach Smith to discuss this further. I will refer her to his office and I will give her some isometric quad exercises to start doing daily. Return to my office PRN.

## 2017-10-11 NOTE — Progress Notes (Signed)
Tawana ScaleZach Smith D.O. [facility] [address] [place], [place] [address] Phone: [phone]) [phone] Subjective:    I'm seeing this patient by the request  of:  Draper DO  CC: Bilateral knee pain  BJY:NWGNFAOZHYHPI:Subjective  Katie Johnson is a 81 y.o. female coming in with complaint of bilateral knee pain.  Has been seen by multiple different providers over the course of the last several months or years even.  Patient has had injections secondary to osteoarthritic changes in the knees.  Patient states left is worse than right. She's looking to talk about injection options.  Patient states that it is affecting daily activities.  Sometimes associated with swelling.  Patient states it can wake her up at night.  Increasing instability and affecting daily activities     Patient did have previous x-rays that were independently visualized by me showing severe bilateral patellofemoral degenerative changes and moderate medial space narrowing.  Past Medical History:  Diagnosis Date  . Hypertension    No past surgical history on file. Social History   Socioeconomic History  . Marital status: Married    Spouse name: None  . Number of children: None  . Years of education: None  . Highest education level: None  Social Needs  . Financial resource strain: None  . Food insecurity - worry: None  . Food insecurity - inability: None  . Transportation needs - medical: None  . Transportation needs - non-medical: None  Occupational History  . None  Tobacco Use  . Smoking status: Never Smoker  . Smokeless tobacco: Never Used  Substance and Sexual Activity  . Alcohol use: None  . Drug use: None  . Sexual activity: None  Other Topics Concern  . None  Social History Narrative  . None   Allergies  Allergen Reactions  . Shellfish-Derived Products   . Ketoprofen Rash   No family history on file.   Past medical history, social, surgical and family history all reviewed in electronic medical record.  No  pertanent information unless stated regarding to the chief complaint.   Review of Systems:Review of systems updated and as accurate as of 10/12/17  No headache, visual changes, nausea, vomiting, diarrhea, constipation, dizziness, abdominal pain, skin rash, fevers, chills, night sweats, weight loss, swollen lymph nodes, body aches,  chest pain, shortness of breath, mood changes.  Positive muscle aches, joint swelling  Objective  Blood pressure 136/72, pulse 66, height 5\' 4"  (1.626 m), weight 163 lb (73.9 kg), SpO2 98 %. Systems examined below as of 10/12/17   General: No apparent distress alert and oriented x3 mood and affect normal, dressed appropriately.  HEENT: Pupils equal, extraocular movements intact  Respiratory: Patient's speak in full sentences and does not appear short of breath  Cardiovascular: No lower extremity edema, non tender, no erythema  Skin: Warm dry intact with no signs of infection or rash on extremities or on axial skeleton.  Abdomen: Soft nontender  Neuro: Cranial nerves II through XII are intact, neurovascularly intact in all extremities with 2+ DTRs and 2+ pulses.  Lymph: No lymphadenopathy of posterior or anterior cervical chain or axillae bilaterally.  Gait antalgic gait MSK:  Non tender with full range of motion and good stability and symmetric strength and tone of shoulders, elbows, wrist, hip, and ankles bilaterally.  Knee: Bilateral valgus deformity noted. Large thigh to calf ratio.  Tender to palpation over medial and PF joint line.  ROM full in flexion and extension and lower leg rotation. instability with  valgus force.  painful patellar compression. Patellar glide with moderate crepitus. Patellar and quadriceps tendons unremarkable. Hamstring and quadriceps strength is normal.  After informed written and verbal consent, patient was seated on exam table. Right knee was prepped with alcohol swab and utilizing anterolateral approach, patient's right knee  space was injected with 4:1  marcaine 0.5%: Kenalog 40mg /dL. Patient tolerated the procedure well without immediate complications.  After informed written and verbal consent, patient was seated on exam table. Left knee was prepped with alcohol swab and utilizing anterolateral approach, patient's left knee space was injected with 4:1  marcaine 0.5%: Kenalog 40mg /dL. Patient tolerated the procedure well without immediate complications.     Impression and Recommendations:     This case required medical decision making of moderate complexity.      Note: This dictation was prepared with Dragon dictation along with smaller phrase technology. Any transcriptional errors that result from this process are unintentional.

## 2017-10-12 ENCOUNTER — Encounter: Payer: Self-pay | Admitting: Family Medicine

## 2017-10-12 ENCOUNTER — Ambulatory Visit: Payer: Medicare Other | Admitting: Family Medicine

## 2017-10-12 DIAGNOSIS — M170 Bilateral primary osteoarthritis of knee: Secondary | ICD-10-CM | POA: Diagnosis not present

## 2017-10-12 NOTE — Patient Instructions (Addendum)
Good to see you.  Ice 20 minutes 2 times daily. Usually after activity and before bed. Exercises 3 times a week.  pennsaid pinkie amount topically 2 times daily as needed.  Injected both knees today  Stay active.  Over the counter get  Vitamin D 2000 IU daily  Turmeric 500mg  daily  Tart cherry extract any dose at night See me again in 4-6 weeks and if not better we can do the monovisc or orthovisc  Happy holidays!

## 2017-10-12 NOTE — Assessment & Plan Note (Signed)
Severe arthritic changes in multiple joints.  Patient does have severe arthritis with valgus deformity and some instability on exam today.  Patient was given steroid injections.  In the past has done formal physical therapy, given home exercises at this time, we discussed icing regimen, which activities of doing which wants to avoid.  Patient is to increase activity slowly over the course of the next several days.  We discussed the possibility of Visco supplementation.  Follow-up again in 4 weeks for further evaluation and treatment.

## 2017-11-08 NOTE — Progress Notes (Signed)
Tawana ScaleZach Smith D.O. [facility] [address] [place], [place] [address] Phone: [phone] Subjective:     CC: Bilateral knee follow-up  BJY:NWGNFAOZHYHPI:Subjective  Katie Johnson is a 82 y.o. female coming in with complaint of bilateral knee pain.  Patient has severe osteoarthritic changes of the knees bilaterally.  Patient given injection October 12, 2017 and was to continue with conservative therapy including icing regimen, home exercise, topical anti-inflammatories and proper shoes.  Patient states that her knees are better but her left knee is still bothering her. She said that she is unable to use the left leg to climb stairs.  Patient states that overall she does think she is improved a little bit but not completely.       Past Medical History:  Diagnosis Date  . Hypertension    No past surgical history on file. Social History   Socioeconomic History  . Marital status: Married    Spouse name: Not on file  . Number of children: Not on file  . Years of education: Not on file  . Highest education level: Not on file  Social Needs  . Financial resource strain: Not on file  . Food insecurity - worry: Not on file  . Food insecurity - inability: Not on file  . Transportation needs - medical: Not on file  . Transportation needs - non-medical: Not on file  Occupational History  . Not on file  Tobacco Use  . Smoking status: Never Smoker  . Smokeless tobacco: Never Used  Substance and Sexual Activity  . Alcohol use: Not on file  . Drug use: Not on file  . Sexual activity: Not on file  Other Topics Concern  . Not on file  Social History Narrative  . Not on file   Allergies  Allergen Reactions  . Shellfish-Derived Products   . Ketoprofen Rash   No family history on file.   Past medical history, social, surgical and family history all reviewed in electronic medical record.  No pertanent information unless stated regarding to the chief complaint.   Review of  Systems:Review of systems updated and as accurate as of 11/08/17  No headache, visual changes, nausea, vomiting, diarrhea, constipation, dizziness, abdominal pain, skin rash, fevers, chills, night sweats, weight loss, swollen lymph nodes, body aches, joint swelling,  chest pain, shortness of breath, mood changes.  Positive muscle aches  Objective  There were no vitals taken for this visit. Systems examined below as of 11/08/17   General: No apparent distress alert and oriented x3 mood and affect normal, dressed appropriately.  HEENT: Pupils equal, extraocular movements intact  Respiratory: Patient's speak in full sentences and does not appear short of breath  Cardiovascular: No lower extremity edema, non tender, no erythema  Skin: Warm dry intact with no signs of infection or rash on extremities or on axial skeleton.  Abdomen: Soft nontender  Neuro: Cranial nerves II through XII are intact, neurovascularly intact in all extremities with 2+ DTRs and 2+ pulses.  Lymph: No lymphadenopathy of posterior or anterior cervical chain or axillae bilaterally.  Gait normal with good balance and coordination.  MSK:  Non tender with full range of motion and good stability and symmetric strength and tone of shoulders, elbows, wrist, hip, and ankles bilaterally.  Knee: Bilateral valgus deformity noted. Large thigh to calf ratio.  Worse on the left Tender to palpation over medial and PF joint line.  ROM full in flexion and extension and lower leg rotation. instability with  valgus force.  Worse on the left worse painful patellar compression. Patellar glide with moderate crepitus. Patellar and quadriceps tendons unremarkable. Hamstring and quadriceps strength is normal.     Impression and Recommendations:     This case required medical decision making of moderate complexity.      Note: This dictation was prepared with Dragon dictation along with smaller phrase technology. Any transcriptional  errors that result from this process are unintentional.

## 2017-11-09 ENCOUNTER — Ambulatory Visit: Payer: Medicare Other | Admitting: Family Medicine

## 2017-11-09 ENCOUNTER — Encounter: Payer: Self-pay | Admitting: Family Medicine

## 2017-11-09 DIAGNOSIS — M170 Bilateral primary osteoarthritis of knee: Secondary | ICD-10-CM

## 2017-11-09 NOTE — Patient Instructions (Addendum)
Good to see you  You are doing great  We always have PT, injections and bracing if you need it At this time lets give the vitamins and the exercises a little more time Ice is your friend Exercises 3 times a week.  See me again in 2 months

## 2017-11-09 NOTE — Assessment & Plan Note (Signed)
Spent  25 minutes with patient face-to-face and had greater than 50% of counseling including as described in assessment and plan.  Discussed with patient in great length about different treatment options including formal physical therapy, custom bracing secondary to large left thigh to calf ratio, as well as the possibility of Visco supplementation.  Patient wants to continue with conservative therapy at this point including home exercises, icing regimen.  Patient will come back and see me again in 2 months

## 2017-11-28 DIAGNOSIS — I10 Essential (primary) hypertension: Secondary | ICD-10-CM | POA: Insufficient documentation

## 2017-11-28 NOTE — Progress Notes (Signed)
[facility] [address], [address] [place], [place] [address] [phone] [phone]  Date:  11/29/2017   Name:  Katie Buttonnn B Poli   DOB:  02/16/1936   MRN:  [id number]  PCP:  Pearline Cablesopland, Jessica C, MD    Chief Complaint: Establish Care (Pt here to est care. )   History of Present Illness:  Katie Johnson is a 82 y.o. very pleasant female patient who presents with the following:  Here today as a new patient to establish care History of HTN Per notes, she had been a pt of [facility] but is now volunteering [facility] at the [facility], so decided to change her care to this facility  [facility] and reviewed other notes:  Immunization History  Administered Date(s) Administered  . Flu Vaccine 60mo and up (FLULAVAL, FLUARIX syr) Inactivated Quadrivalent 09/01/2017  . Tdap (ADACEL, BOOSTRIX) 11/20/2013  . influenza (Historical) 08/16/2013, 08/06/2014, 08/14/2015, 08/02/2016  . pneumococcal conjugate 13-valent (PREVNAR) 02/02/2016  . 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  She is also seeing DR. Smith for her MSK issues  She is using tart cherry and tumeric which are helping her with her OA pain  She did have a CMP in October per Dr. 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 She also did diabetes education, and worked with cardiac patients She did her master's thesis on [employer]  cooking She and her husband have been married for 52 years, [place]   They do not have children   She did have a bone density scan a couple of years ago- it showed osteopenia.  She does not want to repeat this as of yet but will plan to have done in the next year or so  She has no CP or SOB with exercise- she enjoys walking, chair volleyball and yoga, and riding an exercise bike  Patient Active Problem List   Diagnosis Date Noted  . Essential hypertension 11/28/2017  . Degenerative arthritis of knee, bilateral 07/15/2015  . De Quervain's disease (tenosynovitis) 09/15/2011  . METATARSALGIA 07/14/2010  . BUNIONS, BILATERAL 07/14/2010  . FOOT PAIN, BILATERAL 07/14/2010  . BACK PAIN, THORACIC REGION 03/30/2010    Past Medical History:  Diagnosis Date  . Hypertension     History reviewed. No pertinent surgical history.  Social History   Tobacco Use  . Smoking status: Never Smoker  . Smokeless tobacco: Never Used  Substance Use Topics  . Alcohol use: Not on file  . Drug use: Not on file    History reviewed. No pertinent family history.  Allergies  Allergen Reactions  . Shellfish-Derived Products   . Ketoprofen Rash    Medication list has been reviewed and updated.  Current Outpatient Medications on File Prior to Visit  Medication Sig Dispense Refill  . amLODipine-benazepril (LOTREL) 10-20 MG per capsule Take  1 capsule by mouth daily.    . diclofenac sodium (VOLTAREN) 1 % GEL Apply 2 g topically 4 (four) times daily. 1 Tube 3  . fish oil-omega-3 fatty acids 1000 MG capsule Take 1 g by mouth daily.      Marland Kitchen amLODipine-benazepril (LOTREL) 5-10 MG per capsule Take 1 tablet by mouth daily.     No current facility-administered medications on file prior to visit.     Review of Systems:  As per HPI- otherwise negative. No fever or chills No CP or SOB No nausea or vomiting No rash noted   Physical Examination: Vitals:   11/29/17 0936  BP: 131/79   Pulse: 72  Temp: 98.2 F (36.8 C)  SpO2: 99%   Vitals:   11/29/17 0936  Weight: 167 lb 3.2 oz (75.8 kg)  Height: 5\' 4"  (1.626 m)   Body mass index is 28.7 kg/m. Ideal Body Weight: Weight in (lb) to have BMI = 25: 145.3  GEN: WDWN, NAD, Non-toxic, A & O x 3, overweight, looks well HEENT: Atraumatic, Normocephalic. Neck supple. No masses, No LAD.  Bilateral TM wnl, oropharynx normal.  PEERL,EOMI.   Ears and Nose: No external deformity. CV: RRR, No M/G/R. No JVD. No thrill. No extra heart sounds. PULM: CTA B, no wheezes, crackles, rhonchi. No retractions. No resp. distress. No accessory muscle use. ABD: S, NT, ND, +BS. No rebound. No HSM. EXTR: No c/c/e NEURO Normal gait.  PSYCH: Normally interactive. Conversant. Not depressed or anxious appearing.  Calm demeanor.  She has reproducible tenderness in her right back at the bra line/ scapula. She also has a small lipoma in the area which I suspect is the "puffing out" she has noted    Assessment and Plan: Thoracic myofascial strain, initial encounter  Essential hypertension - Plan: amLODipine-benazepril (LOTREL) 10-20 MG capsule  Here today to establish care Right trunk strain which is getting better-  She will let me know if not continuing to improve Refilled BP meds today Recheck here in 6 months   Signed Abbe Amsterdam, MD

## 2017-11-29 ENCOUNTER — Ambulatory Visit (INDEPENDENT_AMBULATORY_CARE_PROVIDER_SITE_OTHER): Payer: Medicare Other | Admitting: Family Medicine

## 2017-11-29 ENCOUNTER — Encounter: Payer: Self-pay | Admitting: Family Medicine

## 2017-11-29 VITALS — BP 131/79 | HR 72 | Temp 98.2°F | Ht 64.0 in | Wt 167.2 lb

## 2017-11-29 DIAGNOSIS — I10 Essential (primary) hypertension: Secondary | ICD-10-CM

## 2017-11-29 DIAGNOSIS — S29019A Strain of muscle and tendon of unspecified wall of thorax, initial encounter: Secondary | ICD-10-CM | POA: Diagnosis not present

## 2017-11-29 MED ORDER — AMLODIPINE BESY-BENAZEPRIL HCL 10-20 MG PO CAPS: 1.0000 | ORAL_CAPSULE | Freq: Every day | ORAL | 3 refills | Status: DC

## 2017-11-29 NOTE — Patient Instructions (Addendum)
It was very nice to meet you today-  I refilled your blood pressure medication for a year today Your BP looks good  Please come and see me in about 6 months for a check up and labs I think the pain in your back is muscular- however please do let me know if it does not continue to improve, in that case I can order some x-rays for you

## 2017-12-08 ENCOUNTER — Encounter: Payer: Self-pay | Admitting: Emergency Medicine

## 2017-12-15 ENCOUNTER — Telehealth: Payer: Self-pay

## 2017-12-15 NOTE — Telephone Encounter (Signed)
Returned patient's call in regards to appt next week with Dr. Katrinka BlazingSmith. Left message for her to call back.

## 2017-12-20 NOTE — Progress Notes (Signed)
Tawana ScaleZach Angle Dirusso D.O. [facility] [address] [place], [place] [address] Phone: [phone] Subjective:      CC: Right shoulder pain  BJY:NWGNFAOZHYHPI:Subjective  Katie Johnson is a 82 y.o. female coming in with complaint of right shoulder pain. Golfing, gardening and chair volleyball makes her shoulder hurt worse. She continues to have pain at rest. She said that her scapula area seems to hurt and becomes swollen. She uses some cream on that area when this occurs. She does have radiating pain into the right pec muscle.  Patient was to be more active but is having difficulty.    Past Medical History:  Diagnosis Date  . Arthritis   . Chicken pox   . GERD (gastroesophageal reflux disease)   . High blood pressure   . Hypertension    No past surgical history on file. Social History   Socioeconomic History  . Marital status: Married    Spouse name: None  . Number of children: None  . Years of education: None  . Highest education level: None  Social Needs  . Financial resource strain: None  . Food insecurity - worry: None  . Food insecurity - inability: None  . Transportation needs - medical: None  . Transportation needs - non-medical: None  Occupational History  . None  Tobacco Use  . Smoking status: Never Smoker  . Smokeless tobacco: Never Used  Substance and Sexual Activity  . Alcohol use: None  . Drug use: None  . Sexual activity: None  Other Topics Concern  . None  Social History Narrative  . None   Allergies  Allergen Reactions  . Shellfish-Derived Products   . Ketoprofen Rash   Family History  Problem Relation Age of Onset  . COPD Mother   . Heart disease Mother   . Diabetes Father   . Heart disease Father      Past medical history, social, surgical and family history all reviewed in electronic medical record.  No pertanent information unless stated regarding to the chief complaint.   Review of Systems:Review of systems updated and as accurate as of  12/21/17  No headache, visual changes, nausea, vomiting, diarrhea, constipation, dizziness, abdominal pain, skin rash, fevers, chills, night sweats, weight loss, swollen lymph nodes, body aches, joint swelling,  chest pain, shortness of breath, mood changes.  Positive muscle aches  Objective  Blood pressure 138/74, pulse 70, height 5\' 4"  (1.626 m), weight 168 lb (76.2 kg), SpO2 94 %. Systems examined below as of 12/21/17   General: No apparent distress alert and oriented x3 mood and affect normal, dressed appropriately.  HEENT: Pupils equal, extraocular movements intact  Respiratory: Patient's speak in full sentences and does not appear short of breath  Cardiovascular: No lower extremity edema, non tender, no erythema  Skin: Warm dry intact with no signs of infection or rash on extremities or on axial skeleton.  Abdomen: Soft nontender  Neuro: Cranial nerves II through XII are intact, neurovascularly intact in all extremities with 2+ DTRs and 2+ pulses.  Lymph: No lymphadenopathy of posterior or anterior cervical chain or axillae bilaterally.  Gait normal with good balance and coordination.  MSK:  Non tender with full range of motion and good stability and symmetric strength and tone of  elbows, wrist, hip, and ankles bilaterally.  Severe arthritic changes of the knee and other joints Shoulder: Right Inspection reveals no abnormalities, atrophy or asymmetry. Palpation reveals pain over the acromioclavicular joint ROM is full in all planes.  Rotator cuff strength normal throughout. Mild impingement on the right Speeds and Yergason's tests normal. No labral pathology noted with negative Obrien's, negative clunk and good stability.  Positive crossover Normal scapular function observed. No painful arc and no drop arm sign. No apprehension sign Contralateral shoulder unremarkable  MSK US performed of: Right shoulder  this study was ordered, performed, and interpreted by Terrilee Files  D.O.  Shoulder:   Supraspinatus:  Appears normal on long and transverse views mild degenerative changes noted AC joint: Severe arthritic changes with positive capsular distention Glenohumeral Joint:  Appears normal without effusion. Glenoid Labrum:  Intact without visualized tears. Biceps Tendon:  Appears normal on long and transverse views, no fraying of tendon, tendon located in intertubercular groove, no subluxation with shoulder internal or external rotation. No increased power doppler signal. Impression: Acromioclavicular arthritis  97110; 15 additional minutes spent for Therapeutic exercises as stated in above notes.  This included exercises focusing on stretching, strengthening, with significant focus on eccentric aspects.   Long term goals include an improvement in range of motion, strength, endurance as well as avoiding reinjury. Patient's frequency would include in 1-2 times a day, 3-5 times a week for a duration of 6-12 weeks. Shoulder Exercises that included:  Basic scapular stabilization to include adduction and depression of scapula Scaption, focusing on proper movement and good control Internal and External rotation utilizing a theraband, with elbow tucked at side entire time Rows with theraband which was givne    Proper technique shown and discussed handout in great detail with ATC.  All questions were discussed and answered.     Impression and Recommendations:     This case required medical decision making of moderate complexity.      Note: This dictation was prepared with Dragon dictation along with smaller phrase technology. Any transcriptional errors that result from this process are unintentional.

## 2017-12-21 ENCOUNTER — Encounter: Payer: Self-pay | Admitting: Family Medicine

## 2017-12-21 ENCOUNTER — Ambulatory Visit: Payer: Self-pay

## 2017-12-21 ENCOUNTER — Ambulatory Visit: Payer: Medicare Other | Admitting: Family Medicine

## 2017-12-21 VITALS — BP 138/74 | HR 70 | Ht 64.0 in | Wt 168.0 lb

## 2017-12-21 DIAGNOSIS — M25511 Pain in right shoulder: Secondary | ICD-10-CM | POA: Diagnosis not present

## 2017-12-21 DIAGNOSIS — M19011 Primary osteoarthritis, right shoulder: Secondary | ICD-10-CM

## 2017-12-21 DIAGNOSIS — M19019 Primary osteoarthritis, unspecified shoulder: Secondary | ICD-10-CM | POA: Insufficient documentation

## 2017-12-21 NOTE — Patient Instructions (Signed)
Good to see you.  Ice 20 minutes 2 times daily. Usually after activity and before bed. Exercises 3 times a week.  pennsaid pinkie amount topically 2 times daily as needed.  Keep hands within peripheral vision with all motions.  See me again in 4-6 weeks and we will talk knees and shoulder.

## 2017-12-21 NOTE — Assessment & Plan Note (Signed)
Acromioclavicular arthritis.  Discussed with patient in great length about icing regimen and home exercises.  We discussed which activities to doing which wants to avoid.  Patient was given topical anti-inflammatories.  Worsening symptoms we will consider potential injection and physical therapy.  Patient is in agreement with the plan.

## 2018-01-09 ENCOUNTER — Ambulatory Visit: Payer: Medicare Other | Admitting: Family Medicine

## 2018-01-15 NOTE — Progress Notes (Signed)
[name] [address] [place], [place] [address] Phone: [phone] Subjective:        CC: Bilateral knee pain, right shoulder pain  BJY:NWGNFAOZHY  Katie Johnson is a 82 y.o. female coming in with complaint of bilateral knee pain.  Known to have severe arthritis of the knees bilaterally.  Discussed with patient in great length.  Patient has been doing fairly well with conservative therapy.  Having some mild increase in discomfort recently.  Rates the severity of pain is 7 out of 10.  Patient states that it is not stopping her from any activities.  Has been doing home exercises fairly regularly.  When he to know what else she can possibly do to try to make another 10-15% improvement.  Patient is also having right shoulder pain.  Found to have acromial clavicular arthritis.  Patient is having difficulty doing the home exercises on her own.  Try to stay active but states that she finds the direction somewhat confusing.  Patient still states that when she sleeps on the shoulder or reaches across her body has some difficulty.     Past Medical History:  Diagnosis Date  . Arthritis   . Chicken pox   . GERD (gastroesophageal reflux disease)   . High blood pressure   . Hypertension    No past surgical history on file. Social History   Socioeconomic History  . Marital status: Married    Spouse name: None  . Number of children: None  . Years of education: None  . Highest education level: None  Social Needs  . Financial resource strain: None  . Food insecurity - worry: None  . Food insecurity - inability: None  . Transportation needs - medical: None  . Transportation needs - non-medical: None  Occupational History  . None  Tobacco Use  . Smoking status: Never Smoker  . Smokeless tobacco: Never Used  Substance and Sexual Activity  . Alcohol use: None  . Drug use: None  . Sexual activity: None  Other Topics Concern  . None  Social History Narrative   . None   Allergies  Allergen Reactions  . Shellfish-Derived Products   . Ketoprofen Rash   Family History  Problem Relation Age of Onset  . COPD Mother   . Heart disease Mother   . Diabetes Father   . Heart disease Father      Past medical history, social, surgical and family history all reviewed in electronic medical record.  No pertanent information unless stated regarding to the chief complaint.   Review of Systems:Review of systems updated and as accurate as of 01/16/18  No headache, visual changes, nausea, vomiting, diarrhea, constipation, dizziness, abdominal pain, skin rash, fevers, chills, night sweats, weight loss, swollen lymph nodes, body aches, joint swelling,  chest pain, shortness of breath, mood changes.  Positive muscle aches  Objective  Blood pressure (!) 144/78, pulse 70, height 5\' 4"  (1.626 m), weight 168 lb (76.2 kg), SpO2 98 %. Systems examined below as of 01/16/18   General: No apparent distress alert and oriented x3 mood and affect normal, dressed appropriately.  HEENT: Pupils equal, extraocular movements intact  Respiratory: Patient's speak in full sentences and does not appear short of breath  Cardiovascular: No lower extremity edema, non tender, no erythema  Skin: Warm dry intact with no signs of infection or rash on extremities or on axial skeleton.  Abdomen: Soft nontender  Neuro: Cranial nerves II through XII are  intact, neurovascularly intact in all extremities with 2+ DTRs and 2+ pulses.  Lymph: No lymphadenopathy of posterior or anterior cervical chain or axillae bilaterally.  Gait antalgic MSK:  Non tender with full range of motion and good stability and symmetric strength and tone of , elbows, wrist, hip, and ankles bilaterally.  Knee: Bilateral valgus deformity noted. Large thigh to calf ratio.  Tender to palpation over medial and PF joint line.  ROM full in flexion and extension and lower leg rotation. instability with valgus force.    painful patellar compression. Patellar glide with moderate crepitus. Patellar and quadriceps tendons unremarkable. Hamstring and quadriceps strength is normal.  Right shoulder exam shows the patient does have crepitus with range of motion.  Lacks last 5 degrees in all planes.  Patient though does have 4+ out of 5 strength compared to the contralateral side.  Positive crossover with tenderness over the acromioclavicular joint.  Negative O'Brien's mild impingement noted   Impression and Recommendations:     This case required medical decision making of moderate complexity.      Note: This dictation was prepared with Dragon dictation along with smaller phrase technology. Any transcriptional errors that result from this process are unintentional.

## 2018-01-16 ENCOUNTER — Other Ambulatory Visit: Payer: Self-pay

## 2018-01-16 ENCOUNTER — Ambulatory Visit: Payer: Medicare Other | Admitting: Family Medicine

## 2018-01-16 ENCOUNTER — Encounter: Payer: Self-pay | Admitting: Family Medicine

## 2018-01-16 DIAGNOSIS — M19011 Primary osteoarthritis, right shoulder: Secondary | ICD-10-CM

## 2018-01-16 DIAGNOSIS — M170 Bilateral primary osteoarthritis of knee: Secondary | ICD-10-CM

## 2018-01-16 DIAGNOSIS — G8929 Other chronic pain: Secondary | ICD-10-CM

## 2018-01-16 DIAGNOSIS — M25519 Pain in unspecified shoulder: Secondary | ICD-10-CM

## 2018-01-16 DIAGNOSIS — M25562 Pain in left knee: Principal | ICD-10-CM

## 2018-01-16 DIAGNOSIS — M25561 Pain in right knee: Principal | ICD-10-CM

## 2018-01-16 MED ORDER — DICLOFENAC SODIUM 1 % TD GEL: 2.0000 g | Freq: Four times a day (QID) | TRANSDERMAL | 3 refills | Status: DC

## 2018-01-16 MED ORDER — DICLOFENAC SODIUM 2 % TD SOLN: 2.0000 g | Freq: Two times a day (BID) | TRANSDERMAL | 3 refills | Status: DC

## 2018-01-16 NOTE — Assessment & Plan Note (Signed)
Spent  25 minutes with patient face-to-face and had greater than 50% of counseling including as described above in assessment and plan. Discussed with patient at great length about icing regimen, home exercise, which activities of doing which wants to avoid.  Patient is to increase activity slowly over the course the next several days.  We will send a formal physical therapy.  Worsening symptoms consider injection.

## 2018-01-16 NOTE — Assessment & Plan Note (Signed)
Stable.  Topical anti-inflammatories given.  Discussed icing regimen and home exercises.  Discussed which activities to do which wants to avoid.  Follow-up again 4-6 weeks

## 2018-01-16 NOTE — Patient Instructions (Signed)
Good to see you  You are doing great  We will get you into physical therapy and they will call you  Stay active Add 2000 IU daily of vitamin D  Continue with the voltren gel  See me again in 6 weeks!

## 2018-01-30 ENCOUNTER — Ambulatory Visit: Payer: Medicare Other | Attending: Family Medicine | Admitting: Physical Therapy

## 2018-01-30 ENCOUNTER — Encounter: Payer: Self-pay | Admitting: Physical Therapy

## 2018-01-30 DIAGNOSIS — M25561 Pain in right knee: Secondary | ICD-10-CM | POA: Diagnosis not present

## 2018-01-30 DIAGNOSIS — R29898 Other symptoms and signs involving the musculoskeletal system: Secondary | ICD-10-CM | POA: Diagnosis present

## 2018-01-30 DIAGNOSIS — M25562 Pain in left knee: Secondary | ICD-10-CM | POA: Insufficient documentation

## 2018-01-30 DIAGNOSIS — G8929 Other chronic pain: Secondary | ICD-10-CM | POA: Diagnosis present

## 2018-01-30 DIAGNOSIS — M25511 Pain in right shoulder: Secondary | ICD-10-CM | POA: Insufficient documentation

## 2018-01-30 NOTE — Therapy (Signed)
[facility] [place] [address]  [address] [place], [place], [address] Phone: [phone]   Fax:  [phone]  Physical Therapy Evaluation  Patient Details  Name: Katie Johnson MRN: [id number] Date of Birth: 08/03/1936 Referring Provider: Dr. Antoine PrimasZachary Smith   Encounter Date: 01/30/2018  PT End of Session - 01/30/18 1514    Visit Number  1    Number of Visits  12    Date for PT Re-Evaluation  03/13/18    Authorization Type  UHC Medicare    PT Start Time  1516    PT Stop Time  1603    PT Time Calculation (min)  47 min    Activity Tolerance  Patient tolerated treatment well    Behavior During Therapy  [facility] for tasks assessed/performed       Past Medical History:  Diagnosis Date  . Arthritis   . Chicken pox   . GERD (gastroesophageal reflux disease)   . High blood pressure   . Hypertension     History reviewed. No pertinent surgical history.  There were no vitals filed for this visit.   Subjective Assessment - 01/30/18 1519    Subjective  has referall for B knee pain as well as shoulder pain - would rahter focus on knees curently. Does yoga 1x/week. Had to use ice on knees last night for knee pain. Climbing stairs are "next to impossible" due to pain, as well as squatting down. Transfers are difficult. Denies N&T into legs and feet. Has a history of injections of B knees x 3 - with limited relief. Has a recumbent that is intermittently used.     Pertinent History  HTN    Diagnostic tests  nothing recently    Patient Stated Goals  walk up and down stairs at living community without pain    Currently in Pain?  No/denies         [facility] - 01/30/18 1524      Assessment   Medical Diagnosis  Chronic pain of both knees; Chronic shoulder pain    Referring Provider  Dr. Antoine PrimasZachary Smith    Next MD Visit  03/06/18    Prior Therapy  no      Precautions   Precautions  None      Restrictions   Weight Bearing Restrictions   No      Balance Screen   Has the patient fallen in the past 6 months  No    Has the patient had a decrease in activity level because of a fear of falling?   No    Is the patient reluctant to leave their home because of a fear of falling?   No      Home Environment   Living Environment  Private residence    Living Arrangements  Spouse/significant other    Type of Home  House    Home Access  Level entry    Home Layout  One level    Home Equipment  None      Prior Function   Level of Independence  Independent    Vocation  Retired      IT consultantCognition   Overall Cognitive Status  Within Functional Limits for tasks assessed      Sensation   Light Touch  Appears Intact      Coordination   Gross Motor Movements are Fluid and Coordinated  Yes      Posture/Postural Control   Posture/Postural Control  Postural limitations    Postural Limitations  Rounded Shoulders;Forward head      ROM / Strength   AROM / PROM / Strength  AROM;Strength      AROM   Overall AROM   Within functional limits for tasks performed    Overall AROM Comments  B LE      Strength   Overall Strength Comments  pain at B knee with MMT    Strength Assessment Site  Hip;Knee    Right/Left Hip  Right;Left    Right Hip Flexion  4-/5    Left Hip Flexion  4-/5    Right/Left Knee  Right;Left    Right Knee Flexion  4/5    Right Knee Extension  4/5    Left Knee Flexion  4/5    Left Knee Extension  4/5      Palpation   Patella mobility  B: good mobility laterally - difficult to assess sup/inf mob due to body habitus    Palpation comment  TTP at R adductor group and quad group              No data recorded  Objective measurements completed on examination: See above findings.      [facility] Adult PT Treatment/Exercise - 01/30/18 1524      Exercises   Exercises  Knee/Hip      Knee/Hip Exercises: Seated   Long Arc Quad  Strengthening;Both;10 reps 5 sec hold      Knee/Hip Exercises: Supine   Bridges   Both;10 reps    Straight Leg Raises  Strengthening;Both;10 reps    Straight Leg Raise with External Rotation  Strengthening;Both;10 reps             PT Education - 01/30/18 1514    Education provided  Yes    Education Details  exam findings, POC, HEP    Person(s) Educated  Patient    Methods  Explanation    Comprehension  Verbalized understanding          PT Long Term Goals - 01/30/18 1515      PT LONG TERM GOAL #1   Title  patient to be independent with advanced HEP    Status  New    Target Date  03/13/18      PT LONG TERM GOAL #2   Title  patient to improve B LE strength to >/= 4+/5 for improved function    Status  New    Target Date  03/13/18      PT LONG TERM GOAL #3   Title  patient to demonstrate reciprocal stair navigation up/down 1 flight wihtout pain limiting    Status  New    Target Date  03/13/18      PT LONG TERM GOAL #4   Title  patient to report improved mobility with reduced pain    Status  New    Target Date  03/13/18      PT LONG TERM GOAL #5   Title  shoulder goals TBD             Plan - 01/30/18 1515    Clinical Impression Statement  Katie Johnson is a 55 y/ female presenting to OPPT today regarding complaints of B knee pain and R shoulder pain - of which patient requests to focus on knee pain currently. Patient today with decreased strength of B LE, TTP in thigh and calf musculature as well as reduced functional mobility due to pain. Patient today given  initial HEP with good understanding and carryover. Patient to benefit from skilled PT intervention to address pain and functional mobility limitations.     Clinical Presentation  Stable    Clinical Decision Making  Low    Rehab Potential  Good    PT Treatment/Interventions  ADLs/Self Care Home Management;Cryotherapy;Electrical Stimulation;Iontophoresis 4mg /ml Dexamethasone;Moist Heat;Therapeutic exercise;Therapeutic activities;Functional mobility training;Stair training;Balance  training;Neuromuscular re-education;Ultrasound;Patient/family education;Manual techniques;Vasopneumatic Device;Taping;Dry needling;Passive range of motion    Consulted and Agree with Plan of Care  Patient       Patient will benefit from skilled therapeutic intervention in order to improve the following deficits and impairments:  Pain, Decreased strength, Decreased mobility, Postural dysfunction, Decreased activity tolerance  Visit Diagnosis: Chronic pain of right knee  Chronic pain of left knee  Chronic right shoulder pain  Other symptoms and signs involving the musculoskeletal system     Problem List Patient Active Problem List   Diagnosis Date Noted  . AC (acromioclavicular) arthritis 12/21/2017  . Essential hypertension 11/28/2017  . Degenerative arthritis of knee, bilateral 07/15/2015  . De Quervain's disease (tenosynovitis) 09/15/2011  . METATARSALGIA 07/14/2010  . BUNIONS, BILATERAL 07/14/2010  . FOOT PAIN, BILATERAL 07/14/2010  . BACK PAIN, THORACIC REGION 03/30/2010     Kipp Laurence, PT, DPT 01/30/18 5:07 PM   [facility] [address]  [address] [place], [place], [address] Phone: [phone]   Fax:  [phone]  Name: Katie Johnson MRN: [id number] Date of Birth: 1935-11-28

## 2018-01-30 NOTE — Patient Instructions (Signed)
Strengthening: Straight Leg Raise (Phase 1)   Tighten muscles on front of right thigh, then lift leg __~8__ inches from surface, keeping knee locked.  Repeat _10-15___ times per set. Do __2__ sets per session.   Straight Leg Raise: With External Leg Rotation    Lie on back with right leg straight, opposite leg bent. Rotate straight leg out and lift __~8__ inches. Repeat __10-15__ times per set. Do __2__ sets per session.   Bridge   Lie back, legs bent. Inhale, pressing hips up. Keeping ribs in, lengthen lower back. Exhale, rolling down along spine from top. Repeat __10-15__ times. Do __2__ sessions per day.  Long [employer] Quad   Straighten operated leg and try to hold it _5___ seconds. Use __0__ lbs on ankle. Repeat _10-15__ times. Do __2__ sessions a day.

## 2018-02-06 ENCOUNTER — Ambulatory Visit: Payer: Medicare Other | Attending: Family Medicine | Admitting: Physical Therapy

## 2018-02-06 ENCOUNTER — Encounter: Payer: Self-pay | Admitting: Physical Therapy

## 2018-02-06 DIAGNOSIS — M25511 Pain in right shoulder: Secondary | ICD-10-CM | POA: Diagnosis present

## 2018-02-06 DIAGNOSIS — R29898 Other symptoms and signs involving the musculoskeletal system: Secondary | ICD-10-CM | POA: Insufficient documentation

## 2018-02-06 DIAGNOSIS — G8929 Other chronic pain: Secondary | ICD-10-CM | POA: Insufficient documentation

## 2018-02-06 DIAGNOSIS — M25561 Pain in right knee: Secondary | ICD-10-CM | POA: Insufficient documentation

## 2018-02-06 DIAGNOSIS — M25562 Pain in left knee: Secondary | ICD-10-CM | POA: Diagnosis present

## 2018-02-06 NOTE — Therapy (Signed)
[facility] [address]  [address] [place], [place], [address] Phone: [phone]   Fax:  [phone]  Physical Therapy Treatment  Patient Details  Name: Katie Johnson MRN: [id number] Date of Birth: 1936-04-07 Referring Provider: Dr. Antoine Primas   Encounter Date: 02/06/2018  PT End of Session - 02/06/18 0857    Visit Number  2    Number of Visits  12    Date for PT Re-Evaluation  03/13/18    Authorization Type  UHC Medicare    PT Start Time  0853    PT Stop Time  0932    PT Time Calculation (min)  39 min    Activity Tolerance  Patient tolerated treatment well    Behavior During Therapy  [facility] for tasks assessed/performed       Past Medical History:  Diagnosis Date  . Arthritis   . Chicken pox   . GERD (gastroesophageal reflux disease)   . High blood pressure   . Hypertension     History reviewed. No pertinent surgical history.  There were no vitals filed for this visit.  Subjective Assessment - 02/06/18 0855    Subjective  knees are "a little on the tender side" - had a rough yoga session yesterday    Pertinent History  HTN    Diagnostic tests  nothing recently    Patient Stated Goals  walk up and down stairs at living community without pain    Currently in Pain?  Yes    Pain Score  2     Pain Location  Knee    Pain Orientation  Right;Left    Pain Descriptors / Indicators  Tender;Sore                       [facility] Adult PT Treatment/Exercise - 02/06/18 0858      Knee/Hip Exercises: Aerobic   Recumbent Bike  L2 x 6 min      Knee/Hip Exercises: Machines for Strengthening   Cybex Leg Press  B LE - 25# x 15      Knee/Hip Exercises: Standing   Functional Squat  10 reps TRX      Knee/Hip Exercises: Seated   Long Arc Quad  Strengthening;Both;15 reps;Weights + ball squeeze - alternating    Long Arc Quad Weight  2 lbs.    Other Seated Knee/Hip Exercises  B LE - fitter - 1 black/1 blue x 15 reps  each side    Hamstring Curl  Strengthening;Both;15 reps    Hamstring Limitations  red tband      Knee/Hip Exercises: Supine   Straight Leg Raises  Strengthening;Both;15 reps 2#    Straight Leg Raise with External Rotation  Strengthening;Both;15 reps 2#                  PT Long Term Goals - 02/06/18 0857      PT LONG TERM GOAL #1   Title  patient to be independent with advanced HEP    Status  On-going      PT LONG TERM GOAL #2   Title  patient to improve B LE strength to >/= 4+/5 for improved function    Status  On-going      PT LONG TERM GOAL #3   Title  patient to demonstrate reciprocal stair navigation up/down 1 flight wihtout pain limiting    Status  On-going      PT LONG TERM GOAL #  4   Title  patient to report improved mobility with reduced pain    Status  On-going      PT LONG TERM GOAL #5   Title  shoulder goals TBD    Status  On-going            Plan - 02/06/18 0858    Clinical Impression Statement  [name] doing well today - some knee soreness due to yoga session yesterday. Tolerable to all progressions of strength training today with no issue. Does have a notable difference in strength at B hips, with L LE seemingly weaker during todays session. Will continue to progress towards goals.     PT Treatment/Interventions  ADLs/Self Care Home Management;Cryotherapy;Electrical Stimulation;Iontophoresis 4mg /ml Dexamethasone;Moist Heat;Therapeutic exercise;Therapeutic activities;Functional mobility training;Stair training;Balance training;Neuromuscular re-education;Ultrasound;Patient/family education;Manual techniques;Vasopneumatic Device;Taping;Dry needling;Passive range of motion    Consulted and Agree with Plan of Care  Patient       Patient will benefit from skilled therapeutic intervention in order to improve the following deficits and impairments:  Pain, Decreased strength, Decreased mobility, Postural dysfunction, Decreased activity tolerance  Visit  Diagnosis: Chronic pain of right knee  Chronic pain of left knee  Chronic right shoulder pain  Other symptoms and signs involving the musculoskeletal system     Problem List Patient Active Problem List   Diagnosis Date Noted  . AC (acromioclavicular) arthritis 12/21/2017  . Essential hypertension 11/28/2017  . Degenerative arthritis of knee, bilateral 07/15/2015  . De Quervain's disease (tenosynovitis) 09/15/2011  . METATARSALGIA 07/14/2010  . BUNIONS, BILATERAL 07/14/2010  . FOOT PAIN, BILATERAL 07/14/2010  . BACK PAIN, THORACIC REGION 03/30/2010     Kipp LaurenceStephanie R Jasdeep Dejarnett, PT, DPT 02/06/18 11:18 AM   [facility] [address]  [address] [place], [place], [address] Phone: [phone]   Fax:  [phone]  Name: Katie Johnson MRN: [id number] Date of Birth: 07/04/1936

## 2018-02-08 ENCOUNTER — Encounter: Payer: Self-pay | Admitting: Physical Therapy

## 2018-02-08 ENCOUNTER — Ambulatory Visit: Payer: Medicare Other | Admitting: Physical Therapy

## 2018-02-08 DIAGNOSIS — M25562 Pain in left knee: Secondary | ICD-10-CM

## 2018-02-08 DIAGNOSIS — M25561 Pain in right knee: Secondary | ICD-10-CM | POA: Diagnosis not present

## 2018-02-08 DIAGNOSIS — R29898 Other symptoms and signs involving the musculoskeletal system: Secondary | ICD-10-CM

## 2018-02-08 DIAGNOSIS — G8929 Other chronic pain: Secondary | ICD-10-CM

## 2018-02-08 DIAGNOSIS — M25511 Pain in right shoulder: Secondary | ICD-10-CM

## 2018-02-08 NOTE — Therapy (Signed)
[facility] [address]  [address] [place], [place], [address] Phone: [phone]   Fax:  [phone]  Physical Therapy Treatment  Patient Details  Name: Katie Johnson MRN: [id number] Date of Birth: Apr 01, 1936 Referring Provider: Dr. Antoine Primas   Encounter Date: 02/08/2018  PT End of Session - 02/08/18 1537    Visit Number  3    Number of Visits  12    Date for PT Re-Evaluation  03/13/18    Authorization Type  UHC Medicare    PT Start Time  1534    PT Stop Time  1614    PT Time Calculation (min)  40 min    Activity Tolerance  Patient tolerated treatment well    Behavior During Therapy  [facility] for tasks assessed/performed       Past Medical History:  Diagnosis Date  . Arthritis   . Chicken pox   . GERD (gastroesophageal reflux disease)   . High blood pressure   . Hypertension     History reviewed. No pertinent surgical history.  There were no vitals filed for this visit.  Subjective Assessment - 02/08/18 1535    Subjective  had some increased soreness after last session, but overall feeling better    Pertinent History  HTN    Diagnostic tests  nothing recently    Patient Stated Goals  walk up and down stairs at living community without pain    Currently in Pain?  Yes    Pain Score  1     Pain Location  Knee    Pain Orientation  Right;Left    Pain Descriptors / Indicators  Aching;Sore                       [facility] Adult PT Treatment/Exercise - 02/08/18 1538      Knee/Hip Exercises: Aerobic   Recumbent Bike  L1 x 6 min      Knee/Hip Exercises: Machines for Strengthening   Cybex Knee Extension  attempted with lightest weight - unable    Cybex Knee Flexion  B LE - 20# - 2 x 15      Knee/Hip Exercises: Standing   Hip Flexion  Stengthening;Both;15 reps;Knee bent 2# - high knee marches    Hip Abduction  Stengthening;Both;15 reps;Knee straight 2#    Hip Extension  Stengthening;Both;15 reps;Knee  straight 2#      Knee/Hip Exercises: Seated   Long Arc Quad  Strengthening;Both;15 reps;Weights    Long Arc Quad Weight  2 lbs.      Knee/Hip Exercises: Supine   Bridges  Strengthening;15 reps    Straight Leg Raises  Strengthening;Both;15 reps 2#                  PT Long Term Goals - 02/06/18 0857      PT LONG TERM GOAL #1   Title  patient to be independent with advanced HEP    Status  On-going      PT LONG TERM GOAL #2   Title  patient to improve B LE strength to >/= 4+/5 for improved function    Status  On-going      PT LONG TERM GOAL #3   Title  patient to demonstrate reciprocal stair navigation up/down 1 flight wihtout pain limiting    Status  On-going      PT LONG TERM GOAL #4   Title  patient to report improved mobility with reduced  pain    Status  On-going      PT LONG TERM GOAL #5   Title  shoulder goals TBD    Status  On-going            Plan - 02/08/18 1537    Clinical Impression Statement  Patient doing well today - reports some soreness after last visit - but reports [place] very little support from handrail. Patient tolerable to all strengthening progressions, however, much difficulty/pain with BATCA leg extensions, thus terminated. Will continue to progress towards goals.     PT Treatment/Interventions  ADLs/Self Care Home Management;Cryotherapy;Electrical Stimulation;Iontophoresis 4mg /ml Dexamethasone;Moist Heat;Therapeutic exercise;Therapeutic activities;Functional mobility training;Stair training;Balance training;Neuromuscular re-education;Ultrasound;Patient/family education;Manual techniques;Vasopneumatic Device;Taping;Dry needling;Passive range of motion    Consulted and Agree with Plan of Care  Patient       Patient will benefit from skilled therapeutic intervention in order to improve the following deficits and impairments:  Pain, Decreased strength, Decreased mobility, Postural dysfunction, Decreased  activity tolerance  Visit Diagnosis: Chronic pain of right knee  Chronic pain of left knee  Chronic right shoulder pain  Other symptoms and signs involving the musculoskeletal system     Problem List Patient Active Problem List   Diagnosis Date Noted  . AC (acromioclavicular) arthritis 12/21/2017  . Essential hypertension 11/28/2017  . Degenerative arthritis of knee, bilateral 07/15/2015  . De Quervain's disease (tenosynovitis) 09/15/2011  . METATARSALGIA 07/14/2010  . BUNIONS, BILATERAL 07/14/2010  . FOOT PAIN, BILATERAL 07/14/2010  . BACK PAIN, THORACIC REGION 03/30/2010     Kipp LaurenceStephanie R Othar Curto, PT, DPT 02/08/18 5:51 PM   [facility] [address]  [address] [place], [place], [address] Phone: [phone]   Fax:  [phone]  Name: Katie Johnson MRN: [id number] Date of Birth: 02/23/1936

## 2018-02-12 ENCOUNTER — Encounter: Payer: Self-pay | Admitting: Physical Therapy

## 2018-02-12 ENCOUNTER — Ambulatory Visit: Payer: Medicare Other | Admitting: Physical Therapy

## 2018-02-12 DIAGNOSIS — M25562 Pain in left knee: Secondary | ICD-10-CM

## 2018-02-12 DIAGNOSIS — M25561 Pain in right knee: Secondary | ICD-10-CM | POA: Diagnosis not present

## 2018-02-12 DIAGNOSIS — M25511 Pain in right shoulder: Secondary | ICD-10-CM

## 2018-02-12 DIAGNOSIS — R29898 Other symptoms and signs involving the musculoskeletal system: Secondary | ICD-10-CM

## 2018-02-12 DIAGNOSIS — G8929 Other chronic pain: Secondary | ICD-10-CM

## 2018-02-12 NOTE — Therapy (Signed)
[facility] [address]  [address] [place], [place], [address] Phone: [phone]   Fax:  [phone]  Physical Therapy Treatment  Patient Details  Name: Katie Johnson MRN: [id number] Date of Birth: 1936-01-03 Referring Provider: Dr. Antoine Johnson   Encounter Date: 02/12/2018  PT End of Session - 02/12/18 0853    Visit Number  4    Number of Visits  12    Date for PT Re-Evaluation  03/13/18    Authorization Type  UHC Medicare    PT Start Time  0848    PT Stop Time  0929    PT Time Calculation (min)  41 min    Activity Tolerance  Patient tolerated treatment well    Behavior During Therapy  Katie Johnson for tasks assessed/performed       Past Medical History:  Diagnosis Date  . Arthritis   . Chicken pox   . GERD (gastroesophageal reflux disease)   . High blood pressure   . Hypertension     History reviewed. No pertinent surgical history.  There were no vitals filed for this visit.  Subjective Assessment - 02/12/18 0850    Subjective  doing well today - spent a good but of time gardening over the weekend    Pertinent History  HTN    Diagnostic tests  nothing recently    Patient Stated Goals  walk up and down stairs at living community without pain    Currently in Pain?  Yes    Pain Score  1     Pain Location  Knee    Pain Orientation  Left;Right    Pain Descriptors / Indicators  Sore    Pain Type  Chronic pain                       [facility] Adult PT Treatment/Exercise - 02/12/18 0855      Knee/Hip Exercises: Aerobic   Nustep  L5 x 6 min      Knee/Hip Exercises: Standing   Hip Flexion  Stengthening;Both;10 reps;Knee straight red tband - 2 pole A    Hip ADduction  Strengthening;Both;10 reps red tband - 2 pole A    Hip Abduction  Stengthening;Both;10 reps;Knee straight red tband - 2 pole A    Hip Extension  Stengthening;Both;10 reps;Knee straight red tband - 2 pole A    Functional Squat  10 reps mini - at  chair    Other Standing Knee Exercises  B side stepping - yellow tband x 20 feet      Knee/Hip Exercises: Seated   Long Arc Quad  Strengthening;Both;10 reps yellow tband - L LE only tolerable to 5 reps    Other Seated Knee/Hip Exercises  B LE - fitter - 1 black/1 blue (R LE) 2 blue (L LE) x 15 reps each side                  PT Long Term Goals - 02/06/18 0857      PT LONG TERM GOAL #1   Title  patient to be independent with advanced HEP    Status  On-going      PT LONG TERM GOAL #2   Title  patient to improve B LE strength to >/= 4+/5 for improved function    Status  On-going      PT LONG TERM GOAL #3   Title  patient to demonstrate reciprocal stair navigation up/down 1 flight  wihtout pain limiting    Status  On-going      PT LONG TERM GOAL #4   Title  patient to report improved mobility with reduced pain    Status  On-going      PT LONG TERM GOAL #5   Title  shoulder goals TBD    Status  On-going            Plan - 02/12/18 0853    Clinical Impression Statement  Katie Hatchnn doing well - reports gardening for 6 hours after last session - some knee sorenes today, but with subjective reports of improved fatigue following activity. Most limitations continue with L LAQ, however tolerable to activites to progress this today.     PT Treatment/Interventions  ADLs/Self Care Home Management;Cryotherapy;Electrical Stimulation;Iontophoresis 4mg /ml Dexamethasone;Moist Heat;Therapeutic exercise;Therapeutic activities;Functional mobility training;Stair training;Balance training;Neuromuscular re-education;Ultrasound;Patient/family education;Manual techniques;Vasopneumatic Device;Taping;Dry needling;Passive range of motion    Consulted and Agree with Plan of Care  Patient       Patient will benefit from skilled therapeutic intervention in order to improve the following deficits and impairments:  Pain, Decreased strength, Decreased mobility, Postural dysfunction, Decreased activity  tolerance  Visit Diagnosis: Chronic pain of right knee  Chronic pain of left knee  Chronic right shoulder pain  Other symptoms and signs involving the musculoskeletal system     Problem List Patient Active Problem List   Diagnosis Date Noted  . AC (acromioclavicular) arthritis 12/21/2017  . Essential hypertension 11/28/2017  . Degenerative arthritis of knee, bilateral 07/15/2015  . De Quervain's disease (tenosynovitis) 09/15/2011  . METATARSALGIA 07/14/2010  . BUNIONS, BILATERAL 07/14/2010  . FOOT PAIN, BILATERAL 07/14/2010  . BACK PAIN, THORACIC REGION 03/30/2010     Katie Johnson, PT, DPT 02/12/18 11:29 AM   [facility] [address]  [address] [place], [place], [address] Phone: [phone]   Fax:  [phone]  Name: Katie Johnson MRN: [id number] Date of Birth: 03/22/1936

## 2018-02-14 ENCOUNTER — Ambulatory Visit: Payer: Medicare Other | Admitting: Physical Therapy

## 2018-02-14 ENCOUNTER — Encounter: Payer: Self-pay | Admitting: Physical Therapy

## 2018-02-14 DIAGNOSIS — R29898 Other symptoms and signs involving the musculoskeletal system: Secondary | ICD-10-CM

## 2018-02-14 DIAGNOSIS — G8929 Other chronic pain: Secondary | ICD-10-CM

## 2018-02-14 DIAGNOSIS — M25561 Pain in right knee: Principal | ICD-10-CM

## 2018-02-14 DIAGNOSIS — M25511 Pain in right shoulder: Secondary | ICD-10-CM

## 2018-02-14 DIAGNOSIS — M25562 Pain in left knee: Secondary | ICD-10-CM

## 2018-02-14 NOTE — Therapy (Signed)
[facility] [address]  [address] [place], [place], [address] Phone: [phone]   Fax:  [phone]  Physical Therapy Treatment  Patient Details  Name: Katie Johnson MRN: [id number] Date of Birth: 09/22/1936 Referring Provider: Dr. Antoine PrimasZachary Smith   Encounter Date: 02/14/2018  PT End of Session - 02/14/18 1621    Visit Number  5    Number of Visits  12    Date for PT Re-Evaluation  03/13/18    Authorization Type  UHC Medicare    PT Start Time  1617    PT Stop Time  1701    PT Time Calculation (min)  44 min    Activity Tolerance  Patient tolerated treatment well    Behavior During Therapy  [facility] for tasks assessed/performed       Past Medical History:  Diagnosis Date  . Arthritis   . Chicken pox   . GERD (gastroesophageal reflux disease)   . High blood pressure   . Hypertension     History reviewed. No pertinent surgical history.  There were no vitals filed for this visit.  Subjective Assessment - 02/14/18 1620    Subjective  doing well - feels like she is starting to get better - felt well after doing HEP    Pertinent History  HTN    Diagnostic tests  nothing recently    Patient Stated Goals  walk up and down stairs at living community without pain    Currently in Pain?  Yes    Pain Score  3  during NuStep    Pain Location  Knee    Pain Orientation  Left    Pain Descriptors / Indicators  Aching;Sore;Discomfort    Pain Type  Chronic pain                       [facility] Adult PT Treatment/Exercise - 02/14/18 1622      Knee/Hip Exercises: Aerobic   Nustep  L5 x 6 min      Knee/Hip Exercises: Standing   Other Standing Knee Exercises  fwd monster walks - 2 x 20 feet - yellow tband      Knee/Hip Exercises: Seated   Long Arc Quad  Left;15 reps greater ease    Other Seated Knee/Hip Exercises  B LE - fitter - 1 black/1 blue - 15 reps R LE, 6 reps L LE    Sit to Sand  -- attmepted from multiple levels  - unable       Modalities   Modalities  Iontophoresis      Iontophoresis   Type of Iontophoresis  Dexamethasone    Location  L anterior knee    Dose  1.0 mL    Time  4-6 hours; 80 mA                  PT Long Term Goals - 02/06/18 0857      PT LONG TERM GOAL #1   Title  patient to be independent with advanced HEP    Status  On-going      PT LONG TERM GOAL #2   Title  patient to improve B LE strength to >/= 4+/5 for improved function    Status  On-going      PT LONG TERM GOAL #3   Title  patient to demonstrate reciprocal stair navigation up/down 1 flight wihtout pain limiting    Status  On-going  PT LONG TERM GOAL #4   Title  patient to report improved mobility with reduced pain    Status  On-going      PT LONG TERM GOAL #5   Title  shoulder goals TBD    Status  On-going            Plan - 02/14/18 1622    Clinical Impression Statement  Patient reporting improvements in pain and functional mobility since starting PT - however, still with primary complaints of general rubbing/arthritic pain in B knees limiting her. Patient questioning continuing PT vs injections at this point due to limited tolerance and progression ability. Heavy education on arthitis and disease process. Did apply ionto patch for hopeful pain relief.     PT Treatment/Interventions  ADLs/Self Care Home Management;Cryotherapy;Electrical Stimulation;Iontophoresis 4mg /ml Dexamethasone;Moist Heat;Therapeutic exercise;Therapeutic activities;Functional mobility training;Stair training;Balance training;Neuromuscular re-education;Ultrasound;Patient/family education;Manual techniques;Vasopneumatic Device;Taping;Dry needling;Passive range of motion    Consulted and Agree with Plan of Care  Patient       Patient will benefit from skilled therapeutic intervention in order to improve the following deficits and impairments:  Pain, Decreased strength, Decreased mobility, Postural dysfunction, Decreased  activity tolerance  Visit Diagnosis: Chronic pain of right knee  Chronic pain of left knee  Chronic right shoulder pain  Other symptoms and signs involving the musculoskeletal system     Problem List Patient Active Problem List   Diagnosis Date Noted  . AC (acromioclavicular) arthritis 12/21/2017  . Essential hypertension 11/28/2017  . Degenerative arthritis of knee, bilateral 07/15/2015  . De Quervain's disease (tenosynovitis) 09/15/2011  . METATARSALGIA 07/14/2010  . BUNIONS, BILATERAL 07/14/2010  . FOOT PAIN, BILATERAL 07/14/2010  . BACK PAIN, THORACIC REGION 03/30/2010     Kipp Laurence, PT, DPT 02/14/18 5:21 PM   [facility] [address]  [address] [place], [place], [address] Phone: [phone]   Fax:  [phone]  Name: Katie Johnson MRN: [id number] Date of Birth: May 17, 1936

## 2018-02-14 NOTE — Patient Instructions (Signed)

## 2018-02-19 ENCOUNTER — Ambulatory Visit: Payer: Medicare Other | Admitting: Physical Therapy

## 2018-02-19 ENCOUNTER — Encounter: Payer: Self-pay | Admitting: Physical Therapy

## 2018-02-19 DIAGNOSIS — M25511 Pain in right shoulder: Secondary | ICD-10-CM

## 2018-02-19 DIAGNOSIS — M25561 Pain in right knee: Secondary | ICD-10-CM | POA: Diagnosis not present

## 2018-02-19 DIAGNOSIS — M25562 Pain in left knee: Secondary | ICD-10-CM

## 2018-02-19 DIAGNOSIS — R29898 Other symptoms and signs involving the musculoskeletal system: Secondary | ICD-10-CM

## 2018-02-19 DIAGNOSIS — G8929 Other chronic pain: Secondary | ICD-10-CM

## 2018-02-19 NOTE — Therapy (Signed)
[facility] [address]  [address] [place], [place], [address] Phone: [phone]   Fax:  [phone]  Physical Therapy Treatment  Patient Details  Name: Katie Johnson MRN: [id number] Date of Birth: [birth year] Referring Provider: Dr. Antoine Primas   Encounter Date: 02/19/2018  PT End of Session - 02/19/18 1615    Visit Number  6    Number of Visits  12    Date for PT Re-Evaluation  03/13/18    Authorization Type  UHC Medicare    PT Start Time  1605    PT Stop Time  1645    PT Time Calculation (min)  40 min    Activity Tolerance  Patient tolerated treatment well    Behavior During Therapy  [facility] for tasks assessed/performed       Past Medical History:  Diagnosis Date  . Arthritis   . Chicken pox   . GERD (gastroesophageal reflux disease)   . High blood pressure   . Hypertension     History reviewed. No pertinent surgical history.  There were no vitals filed for this visit.  Subjective Assessment - 02/19/18 1608    Subjective  feels like ionto patch helped some, may elect for another patch later this week as [facility]    Pertinent History  HTN    Diagnostic tests  nothing recently    Patient Stated Goals  walk up and down stairs at living community without pain    Currently in Pain?  Yes    Pain Score  4     Pain Location  Knee    Pain Orientation  Left    Pain Descriptors / Indicators  Aching;Sore    Pain Type  Chronic pain                       [facility] Adult PT Treatment/Exercise - 02/19/18 1616      Knee/Hip Exercises: Aerobic   Nustep  L5 x 6 min      Knee/Hip Exercises: Standing   Lateral Step Up  Right;Left;10 reps;Hand Hold: 2;Step Height: 6"    Forward Step Up  Right;Left;15 reps;Hand Hold: 2;Step Height: 4" + opposite knee drive    Other Standing Knee Exercises  B side stepping - red tband x 25 feet    Other Standing Knee Exercises  fwd/bwd monster walks - red tband x  25 feet each direction      Knee/Hip Exercises: Seated   Hamstring Curl  Strengthening;Both;15 reps    Hamstring Limitations  green tband      Modalities   Modalities  Iontophoresis      Iontophoresis   Type of Iontophoresis  Dexamethasone    Location  L anterior knee    Dose  1.0 mL    Time  4-6 hours; 80 mA                  PT Long Term Goals - 02/06/18 0857      PT LONG TERM GOAL #1   Title  patient to be independent with advanced HEP    Status  On-going      PT LONG TERM GOAL #2   Title  patient to improve B LE strength to >/= 4+/5 for improved function    Status  On-going      PT LONG TERM GOAL #3   Title  patient to demonstrate reciprocal stair navigation  up/down 1 flight wihtout pain limiting    Status  On-going      PT LONG TERM GOAL #4   Title  patient to report improved mobility with reduced pain    Status  On-going      PT LONG TERM GOAL #5   Title  shoulder goals TBD    Status  On-going            Plan - 02/19/18 1616    Clinical Impression Statement  Patient noting good relief from ionto patch last visit - did yoga this morning and had to get on her knees with increased pain. patient tolerable to all strengthening work + reapplication of ionto for pain relief. Making good progress towards goals.     PT Treatment/Interventions  ADLs/Self Care Home Management;Cryotherapy;Electrical Stimulation;Iontophoresis 4mg /ml Dexamethasone;Moist Heat;Therapeutic exercise;Therapeutic activities;Functional mobility training;Stair training;Balance training;Neuromuscular re-education;Ultrasound;Patient/family education;Manual techniques;Vasopneumatic Device;Taping;Dry needling;Passive range of motion    Consulted and Agree with Plan of Care  Patient       Patient will benefit from skilled therapeutic intervention in order to improve the following deficits and impairments:  Pain, Decreased strength, Decreased mobility, Postural dysfunction, Decreased activity  tolerance  Visit Diagnosis: Chronic pain of right knee  Chronic pain of left knee  Chronic right shoulder pain  Other symptoms and signs involving the musculoskeletal system     Problem List Patient Active Problem List   Diagnosis Date Noted  . AC (acromioclavicular) arthritis 12/21/2017  . Essential hypertension 11/28/2017  . Degenerative arthritis of knee, bilateral 07/15/2015  . De Quervain's disease (tenosynovitis) 09/15/2011  . METATARSALGIA 07/14/2010  . BUNIONS, BILATERAL 07/14/2010  . FOOT PAIN, BILATERAL 07/14/2010  . BACK PAIN, THORACIC REGION 03/30/2010    Kipp LaurenceStephanie R Vonceil Upshur, PT, DPT 02/19/18 4:47 PM   [facility] [place] [address]  [address] [place], [place], [address] Phone: [phone]   Fax:  [phone]  Name: Katie Johnson MRN: [id number] Date of Birth: 06/22/1936

## 2018-02-21 ENCOUNTER — Encounter: Payer: Self-pay | Admitting: Physical Therapy

## 2018-02-21 ENCOUNTER — Ambulatory Visit: Payer: Medicare Other | Admitting: Physical Therapy

## 2018-02-21 DIAGNOSIS — R29898 Other symptoms and signs involving the musculoskeletal system: Secondary | ICD-10-CM

## 2018-02-21 DIAGNOSIS — G8929 Other chronic pain: Secondary | ICD-10-CM

## 2018-02-21 DIAGNOSIS — M25561 Pain in right knee: Principal | ICD-10-CM

## 2018-02-21 DIAGNOSIS — M25562 Pain in left knee: Secondary | ICD-10-CM

## 2018-02-21 DIAGNOSIS — M25511 Pain in right shoulder: Secondary | ICD-10-CM

## 2018-02-21 NOTE — Therapy (Signed)
[facility] [place] [address]  [address] [place], [place], [address] Phone: [phone]   Fax:  [phone]  Physical Therapy Treatment  Patient Details  Name: Katie Johnson MRN: [id number] Date of Birth: 02/01/1936 Referring Provider: Dr. Antoine PrimasZachary Smith   Encounter Date: 02/21/2018  PT End of Session - 02/21/18 1537    Visit Number  7    Number of Visits  12    Date for PT Re-Evaluation  03/13/18    Authorization Type  UHC Medicare    PT Start Time  1532    PT Stop Time  1613    PT Time Calculation (min)  41 min    Activity Tolerance  Patient tolerated treatment well    Behavior During Therapy  [facility] for tasks assessed/performed       Past Medical History:  Diagnosis Date  . Arthritis   . Chicken pox   . GERD (gastroesophageal reflux disease)   . High blood pressure   . Hypertension     History reviewed. No pertinent surgical history.  There were no vitals filed for this visit.  Subjective Assessment - 02/21/18 1536    Subjective  felt really well after resting and from ionto patch    Pertinent History  HTN    Diagnostic tests  nothing recently    Patient Stated Goals  walk up and down stairs at living community without pain    Currently in Pain?  Yes    Pain Score  3     Pain Location  Knee    Pain Orientation  Left    Pain Descriptors / Indicators  Aching;Sore;Discomfort    Pain Type  Chronic pain                       [facility] Adult PT Treatment/Exercise - 02/21/18 1538      Knee/Hip Exercises: Aerobic   Nustep  L5 x 7 min      Knee/Hip Exercises: Machines for Strengthening   Cybex Leg Press  B LE - 35# x 15      Knee/Hip Exercises: Standing   Forward Lunges  Right;Left;10 reps fwd/bwd step - 1 UE support    Step Down  Right;Left;10 reps;Hand Hold: 2;Step Height: 4" L LE - mini depth    Other Standing Knee Exercises  resisted fwd/bwd walking with black tband at hips - 2 x 40 feet each direction       Modalities   Modalities  Iontophoresis      Iontophoresis   Type of Iontophoresis  Dexamethasone    Location  L anterior knee    Dose  1.0 mL    Time  4-6 hours; 80 mA                  PT Long Term Goals - 02/06/18 0857      PT LONG TERM GOAL #1   Title  patient to be independent with advanced HEP    Status  On-going      PT LONG TERM GOAL #2   Title  patient to improve B LE strength to >/= 4+/5 for improved function    Status  On-going      PT LONG TERM GOAL #3   Title  patient to demonstrate reciprocal stair navigation up/down 1 flight wihtout pain limiting    Status  On-going      PT LONG TERM GOAL #4   Title  patient to report improved mobility with reduced pain    Status  On-going      PT LONG TERM GOAL #5   Title  shoulder goals TBD    Status  On-going            Plan - 02/21/18 1537    Clinical Impression Statement  Patient conitnues to note benefit from ionto patch - reapplied today. Feels like she is gaining strength and true benefit from PT sessions. PT sesson today focusing on continued quad strengthening, as well as general LE strengthening. Good progress with resisted walking, however, noted L LE weaker during this task. Will continue to progress towards goals.     PT Treatment/Interventions  ADLs/Self Care Home Management;Cryotherapy;Electrical Stimulation;Iontophoresis 4mg /ml Dexamethasone;Moist Heat;Therapeutic exercise;Therapeutic activities;Functional mobility training;Stair training;Balance training;Neuromuscular re-education;Ultrasound;Patient/family education;Manual techniques;Vasopneumatic Device;Taping;Dry needling;Passive range of motion    Consulted and Agree with Plan of Care  Patient       Patient will benefit from skilled therapeutic intervention in order to improve the following deficits and impairments:  Pain, Decreased strength, Decreased mobility, Postural dysfunction, Decreased activity tolerance  Visit  Diagnosis: Chronic pain of right knee  Chronic pain of left knee  Chronic right shoulder pain  Other symptoms and signs involving the musculoskeletal system     Problem List Patient Active Problem List   Diagnosis Date Noted  . AC (acromioclavicular) arthritis 12/21/2017  . Essential hypertension 11/28/2017  . Degenerative arthritis of knee, bilateral 07/15/2015  . De Quervain's disease (tenosynovitis) 09/15/2011  . METATARSALGIA 07/14/2010  . BUNIONS, BILATERAL 07/14/2010  . FOOT PAIN, BILATERAL 07/14/2010  . BACK PAIN, THORACIC REGION 03/30/2010     Kipp Laurence, PT, DPT 02/21/18 4:22 PM   [facility] [address]  [address] [place], [place], [address] Phone: [phone]   Fax:  [phone]  Name: Katie Johnson MRN: [id number] Date of Birth: [birth year]

## 2018-03-05 ENCOUNTER — Ambulatory Visit: Payer: Medicare Other | Admitting: Physical Therapy

## 2018-03-05 ENCOUNTER — Encounter: Payer: Self-pay | Admitting: Physical Therapy

## 2018-03-05 DIAGNOSIS — M25561 Pain in right knee: Secondary | ICD-10-CM | POA: Diagnosis not present

## 2018-03-05 DIAGNOSIS — M25562 Pain in left knee: Secondary | ICD-10-CM

## 2018-03-05 DIAGNOSIS — M25511 Pain in right shoulder: Secondary | ICD-10-CM

## 2018-03-05 DIAGNOSIS — G8929 Other chronic pain: Secondary | ICD-10-CM

## 2018-03-05 DIAGNOSIS — R29898 Other symptoms and signs involving the musculoskeletal system: Secondary | ICD-10-CM

## 2018-03-05 NOTE — Progress Notes (Signed)
[name] [address] [place], [place] [address] Phone: [phone] Subjective:     CC: Bilateral knee pain follow-up  BJY:[id number]  Katie Johnson is a 82 y.o. female coming in with complaint of bilateral knee pain. She said that her pain has improved since doing physical therapy.  Patient is approximately 75% better.  Not as much pain.  Noticing that she has more endurance.  Patient states is much easier getting in and out of the chair as well.  Not having as much discomfort.     Past Medical History:  Diagnosis Date  . Arthritis   . Chicken pox   . GERD (gastroesophageal reflux disease)   . High blood pressure   . Hypertension    No past surgical history on file. Social History   Socioeconomic History  . Marital status: Married    Spouse name: Not on file  . Number of children: Not on file  . Years of education: Not on file  . Highest education level: Not on file  Occupational History  . Not on file  Social Needs  . Financial resource strain: Not on file  . Food insecurity:    Worry: Not on file    Inability: Not on file  . Transportation needs:    Medical: Not on file    Non-medical: Not on file  Tobacco Use  . Smoking status: Never Smoker  . Smokeless tobacco: Never Used  Substance and Sexual Activity  . Alcohol use: Not on file  . Drug use: Not on file  . Sexual activity: Not on file  Lifestyle  . Physical activity:    Days per week: Not on file    Minutes per session: Not on file  . Stress: Not on file  Relationships  . Social connections:    Talks on phone: Not on file    Gets together: Not on file    Attends religious service: Not on file    Active member of club or organization: Not on file    Attends meetings of clubs or organizations: Not on file    Relationship status: Not on file  Other Topics Concern  . Not on file  Social History Narrative  . Not on file   Allergies  Allergen Reactions  .  Shellfish-Derived Products   . Ketoprofen Rash   Family History  Problem Relation Age of Onset  . COPD Mother   . Heart disease Mother   . Diabetes Father   . Heart disease Father      Past medical history, social, surgical and family history all reviewed in electronic medical record.  No pertanent information unless stated regarding to the chief complaint.   Review of Systems:Review of systems updated and as accurate as of 03/06/18  No headache, visual changes, nausea, vomiting, diarrhea, constipation, dizziness, abdominal pain, skin rash, fevers, chills, night sweats, weight loss, swollen lymph nodes, body aches, joint swelling, muscle aches, chest pain, shortness of breath, mood changes.   Objective  Blood pressure 140/78, pulse 73, height  (1.626 m), weight 168 lb (76.2 kg), SpO2 98 %. Systems examined below as of 03/06/18   General: No apparent distress alert and oriented x3 mood and affect normal, dressed appropriately.  HEENT: Pupils equal, extraocular movements intact  Respiratory: Patient's speak in full sentences and does not appear short of breath  Cardiovascular: No lower extremity edema, non tender, no erythema  Skin: Warm dry intact with no signs  of infection or rash on extremities or on axial skeleton.  Abdomen: Soft nontender  Neuro: Cranial nerves II through XII are intact, neurovascularly intact in all extremities with 2+ DTRs and 2+ pulses.  Lymph: No lymphadenopathy of posterior or anterior cervical chain or axillae bilaterally.  Gait very mild antalgic gait MSK:  Non tender with full range of motion and good stability and symmetric strength and tone of shoulders, elbows, wrist, hip, and ankles bilaterally.  Knee: Bilateral valgus deformity noted. Large thigh to calf ratio.  Nontender on exam ROM full in flexion and extension and lower leg rotation. instability with valgus force.  Mild painful patellar compression. Patellar glide with mild  crepitus. Patellar and quadriceps tendons unremarkable. Hamstring and quadriceps strength is normal.    Impression and Recommendations:     This case required medical decision making of moderate complexity.      Note: This dictation was prepared with Dragon dictation along with smaller phrase technology. Any transcriptional errors that result from this process are unintentional.

## 2018-03-05 NOTE — Therapy (Signed)
[facility] [address]  [address] [place], [place], [address] Phone: [phone]   Fax:  [phone]  Physical Therapy Treatment  Patient Details  Name: Katie Johnson MRN: [id number] Date of Birth: Apr 25, 1936 Referring Provider: Dr. Antoine Primas   Encounter Date: 03/05/2018  PT End of Session - 03/05/18 1539    Visit Number  8    Number of Visits  12    Date for PT Re-Evaluation  03/13/18    Authorization Type  UHC Medicare    PT Start Time  1536    PT Stop Time  1615    PT Time Calculation (min)  39 min    Activity Tolerance  Patient tolerated treatment well    Behavior During Therapy  [facility] for tasks assessed/performed       Past Medical History:  Diagnosis Date  . Arthritis   . Chicken pox   . GERD (gastroesophageal reflux disease)   . High blood pressure   . Hypertension     History reviewed. No pertinent surgical history.  There were no vitals filed for this visit.  Subjective Assessment - 03/05/18 1536    Subjective  Went to [facility] - able to play golf x 3 - pain has been much better than in the past    Pertinent History  HTN    Diagnostic tests  nothing recently    Patient Stated Goals  walk up and down stairs at living community without pain    Currently in Pain?  Yes    Pain Score  2     Pain Location  Knee    Pain Orientation  Left    Pain Descriptors / Indicators  Aching;Sore    Pain Type  Chronic pain                       [facility] Adult PT Treatment/Exercise - 03/05/18 1540      Knee/Hip Exercises: Aerobic   Nustep  L5 x 6 min      Knee/Hip Exercises: Machines for Strengthening   Cybex Knee Flexion  B LE - 25# x 20    Cybex Leg Press  B LE - 35# x 15      Knee/Hip Exercises: Standing   Lateral Step Up  Right;Left;10 reps;Hand Hold: 1;Step Height: 6"    Forward Step Up  Right;Left;10 reps;Hand Hold: 1;Step Height: 6"    Functional Squat  10 reps TRX      Knee/Hip  Exercises: Seated   Other Seated Knee/Hip Exercises  B LE fitter - 1 black/1blue x 15 reps each LE    Sit to Sand  10 reps from low mat table - light support from PT                  PT Long Term Goals - 02/06/18 0857      PT LONG TERM GOAL #1   Title  patient to be independent with advanced HEP    Status  On-going      PT LONG TERM GOAL #2   Title  patient to improve B LE strength to >/= 4+/5 for improved function    Status  On-going      PT LONG TERM GOAL #3   Title  patient to demonstrate reciprocal stair navigation up/down 1 flight wihtout pain limiting    Status  On-going      PT LONG TERM GOAL #4  Title  patient to report improved mobility with reduced pain    Status  On-going      PT LONG TERM GOAL #5   Title  shoulder goals TBD    Status  On-going            Plan - 03/05/18 1539    Clinical Impression Statement  Katie Johnson returning from vacation - able to play golf x 3 days wihtout issue. Patient with subjective reports of feeling better. Clinically, making good progress with ability to tolerate increased resistance and weight, step height, as well as improving sit to stand with much less support provided from PT. Patient to follow-up with referring provider tomorrow.     PT Treatment/Interventions  ADLs/Self Care Home Management;Cryotherapy;Electrical Stimulation;Iontophoresis /ml Dexamethasone;Moist Heat;Therapeutic exercise;Therapeutic activities;Functional mobility training;Stair training;Balance training;Neuromuscular re-education;Ultrasound;Patient/family education;Manual techniques;Vasopneumatic Device;Taping;Dry needling;Passive range of motion    Consulted and Agree with Plan of Care  Patient       Patient will benefit from skilled therapeutic intervention in order to improve the following deficits and impairments:  Pain, Decreased strength, Decreased mobility, Postural dysfunction, Decreased activity tolerance  Visit Diagnosis: Chronic pain of  right knee  Chronic pain of left knee  Chronic right shoulder pain  Other symptoms and signs involving the musculoskeletal system     Problem List Patient Active Problem List   Diagnosis Date Noted  . AC (acromioclavicular) arthritis 12/21/2017  . Essential hypertension 11/28/2017  . Degenerative arthritis of knee, bilateral 07/15/2015  . De Quervain's disease (tenosynovitis) 09/15/2011  . METATARSALGIA 07/14/2010  . BUNIONS, BILATERAL 07/14/2010  . FOOT PAIN, BILATERAL 07/14/2010  . BACK PAIN, THORACIC REGION 03/30/2010     Kipp Laurence, PT, DPT 03/05/18 5:20 PM   [facility] [address]  [address] [place], [place], [address] Phone: [phone]   Fax:  [phone]  Name: Katie Johnson MRN: [id number] Date of Birth: 11/26/1935

## 2018-03-06 ENCOUNTER — Ambulatory Visit: Payer: Medicare Other | Admitting: Family Medicine

## 2018-03-06 VITALS — BP 140/78 | HR 73 | Ht 64.0 in | Wt 168.0 lb

## 2018-03-06 DIAGNOSIS — M1710 Unilateral primary osteoarthritis, unspecified knee: Secondary | ICD-10-CM | POA: Diagnosis not present

## 2018-03-06 DIAGNOSIS — M170 Bilateral primary osteoarthritis of knee: Secondary | ICD-10-CM

## 2018-03-06 NOTE — Patient Instructions (Signed)
Good to see you  We will elongate the PT another couple weeks at least  Keep it up  I am proud of you  Call us at [phone] if you need [place] sooner Otherwise see me again in 3 months

## 2018-03-06 NOTE — Assessment & Plan Note (Signed)
Doing significantly better at this time.  Discussed icing regimen and home exercises.  Can do injections if needed in the future.  Patient has done very well with physical therapy would like to continue.  Follow-up with me again in 3 months

## 2018-03-07 ENCOUNTER — Ambulatory Visit: Payer: Medicare Other | Attending: Family Medicine | Admitting: Physical Therapy

## 2018-03-07 ENCOUNTER — Encounter: Payer: Self-pay | Admitting: Physical Therapy

## 2018-03-07 DIAGNOSIS — M25562 Pain in left knee: Secondary | ICD-10-CM | POA: Insufficient documentation

## 2018-03-07 DIAGNOSIS — R29898 Other symptoms and signs involving the musculoskeletal system: Secondary | ICD-10-CM | POA: Diagnosis present

## 2018-03-07 DIAGNOSIS — M25511 Pain in right shoulder: Secondary | ICD-10-CM | POA: Diagnosis present

## 2018-03-07 DIAGNOSIS — M25561 Pain in right knee: Secondary | ICD-10-CM | POA: Diagnosis not present

## 2018-03-07 DIAGNOSIS — G8929 Other chronic pain: Secondary | ICD-10-CM | POA: Insufficient documentation

## 2018-03-07 NOTE — Therapy (Signed)
[facility] Outpatient Rehabilitation [facility] [address]  [address] [place], [place], [address] Phone: [phone]   Fax:  [phone]  Physical Therapy Treatment  Patient Details  Name: Katie Johnson MRN: [id number] Date of Birth: [birth year] Referring Provider: Dr. Antoine Primas   Encounter Date: 03/07/2018  PT End of Session - 03/07/18 1539    Visit Number  9    Number of Visits  12    Date for PT Re-Evaluation  03/13/18    Authorization Type  UHC Medicare    PT Start Time  1536    PT Stop Time  1614    PT Time Calculation (min)  38 min    Activity Tolerance  Patient tolerated treatment well    Behavior During Therapy  [facility] for tasks assessed/performed       Past Medical History:  Diagnosis Date  . Arthritis   . Chicken pox   . GERD (gastroesophageal reflux disease)   . High blood pressure   . Hypertension     History reviewed. No pertinent surgical history.  There were no vitals filed for this visit.  Subjective Assessment - 03/07/18 1538    Subjective  saw MD - pleased with current progress - will plan ot finish up next week    Pertinent History  HTN    Diagnostic tests  nothing recently    Patient Stated Goals  walk up and down stairs at living community without pain    Currently in Pain?  Yes    Pain Score  2     Pain Location  Knee    Pain Orientation  Left    Pain Descriptors / Indicators  Aching;Sore    Pain Type  Chronic pain                       [facility] Adult PT Treatment/Exercise - 03/07/18 1545      Knee/Hip Exercises: Aerobic   Nustep  L5 x 6 min      Knee/Hip Exercises: Machines for Strengthening   Cybex Knee Flexion  B LE - 25# x 20      Knee/Hip Exercises: Standing   Heel Raises  Both;15 reps B UE support - negative    Terminal Knee Extension  Strengthening;Left;15 reps;Theraband    Theraband Level (Terminal Knee Extension)  Level 4 (Blue)      Knee/Hip Exercises: Seated   Long Arc Quad   Strengthening;Left;15 reps;Weights    Long Arc Quad Weight  2 lbs.    Other Seated Knee/Hip Exercises  L heel to R knee - 2# x 15    Sit to Sand  5 reps from mat table - PT assist      Modalities   Modalities  Iontophoresis      Iontophoresis   Type of Iontophoresis  Dexamethasone    Location  L anterior knee    Dose  1.0 mL    Time  4-6 hours; 80 mA                  PT Long Term Goals - 02/06/18 0857      PT LONG TERM GOAL #1   Title  patient to be independent with advanced HEP    Status  On-going      PT LONG TERM GOAL #2   Title  patient to improve B LE strength to >/= 4+/5 for improved function    Status  On-going  PT LONG TERM GOAL #3   Title  patient to demonstrate reciprocal stair navigation up/down 1 flight wihtout pain limiting    Status  On-going      PT LONG TERM GOAL #4   Title  patient to report improved mobility with reduced pain    Status  On-going      PT LONG TERM GOAL #5   Title  shoulder goals TBD    Status  On-going            Plan - 03/07/18 1627    Clinical Impression Statement  Patient reporting 85% improvement in symptoms since beginning PT. Patient much improved sit to stand with less external support from PT as well as ability to progress LAQ with today, as she was previously unable to extend LE against gravity. Does require some cueing throughout session for appropriate form and control. Will plan to wrap up POC within the coming visit.     PT Treatment/Interventions  ADLs/Self Care Home Management;Cryotherapy;Electrical Stimulation;Iontophoresis /ml Dexamethasone;Moist Heat;Therapeutic exercise;Therapeutic activities;Functional mobility training;Stair training;Balance training;Neuromuscular re-education;Ultrasound;Patient/family education;Manual techniques;Vasopneumatic Device;Taping;Dry needling;Passive range of motion    Consulted and Agree with Plan of Care  Patient       Patient will benefit from skilled therapeutic  intervention in order to improve the following deficits and impairments:  Pain, Decreased strength, Decreased mobility, Postural dysfunction, Decreased activity tolerance  Visit Diagnosis: Chronic pain of right knee  Chronic pain of left knee  Chronic right shoulder pain  Other symptoms and signs involving the musculoskeletal system     Problem List Patient Active Problem List   Diagnosis Date Noted  . AC (acromioclavicular) arthritis 12/21/2017  . Essential hypertension 11/28/2017  . Degenerative arthritis of knee, bilateral 07/15/2015  . De Quervain's disease (tenosynovitis) 09/15/2011  . METATARSALGIA 07/14/2010  . BUNIONS, BILATERAL 07/14/2010  . FOOT PAIN, BILATERAL 07/14/2010  . BACK PAIN, THORACIC REGION 03/30/2010     Kipp Laurence, PT, DPT 03/07/18 4:29 PM   [facility] [address]  [address] [place], [place], [address] Phone: [phone]   Fax:  [phone]  Name: MAIREN WALLENSTEIN MRN: [id number] Date of Birth: [birth year]

## 2018-03-12 ENCOUNTER — Encounter: Payer: Self-pay | Admitting: Physical Therapy

## 2018-03-12 ENCOUNTER — Ambulatory Visit: Payer: Medicare Other | Admitting: Physical Therapy

## 2018-03-12 DIAGNOSIS — M25562 Pain in left knee: Secondary | ICD-10-CM

## 2018-03-12 DIAGNOSIS — R29898 Other symptoms and signs involving the musculoskeletal system: Secondary | ICD-10-CM

## 2018-03-12 DIAGNOSIS — M25511 Pain in right shoulder: Secondary | ICD-10-CM

## 2018-03-12 DIAGNOSIS — M25561 Pain in right knee: Principal | ICD-10-CM

## 2018-03-12 DIAGNOSIS — G8929 Other chronic pain: Secondary | ICD-10-CM

## 2018-03-12 NOTE — Therapy (Signed)
[facility] Outpatient Rehabilitation [facility] [address]  [address] [place], [place], [address] Phone: [phone]   Fax:  [phone]  Physical Therapy Treatment  Patient Details  Name: Katie Johnson MRN: [id number] Date of Birth: 1936/08/24 Referring Provider: Dr. Antoine Primas   Encounter Date: 03/12/2018  PT End of Session - 03/12/18 1149    Visit Number  10    Number of Visits  12    Date for PT Re-Evaluation  03/13/18    Authorization Type  UHC Medicare    PT Start Time  1102    PT Stop Time  1144    PT Time Calculation (min)  42 min    Activity Tolerance  Patient tolerated treatment well    Behavior During Therapy  [facility] for tasks assessed/performed       Past Medical History:  Diagnosis Date  . Arthritis   . Chicken pox   . GERD (gastroesophageal reflux disease)   . High blood pressure   . Hypertension     History reviewed. No pertinent surgical history.  There were no vitals filed for this visit.  Subjective Assessment - 03/12/18 1103    Subjective  Patient reports that she is doing good today. Did some yard work over the weekend.    Pertinent History  HTN    Diagnostic tests  nothing recently    Patient Stated Goals  walk up and down stairs at living community without pain    Pain Score  1     Pain Orientation  Left    Pain Descriptors / Indicators  Dull    Pain Type  Chronic pain                       [facility] Adult PT Treatment/Exercise - 03/12/18 1109      Knee/Hip Exercises: Aerobic   Nustep  L5 x 5 min      Knee/Hip Exercises: Machines for Strengthening   Cybex Knee Flexion  B LE - 25# x 20      Knee/Hip Exercises: Standing   Terminal Knee Extension  Strengthening;Left;15 reps;Theraband    Theraband Level (Terminal Knee Extension)  Level 4 (Blue)      Knee/Hip Exercises: Seated   Long Arc Quad  Strengthening;Left;15 reps;Weights    Long Arc Quad Weight  2 lbs.    Other Seated Knee/Hip Exercises  IR/ER with  red TB and green bacll; 2x 15 each    Sit to Sand  2 sets;5 reps;with UE support sitting on airex 5x, without airex 5x      Knee/Hip Exercises: Supine   Bridges  2 sets;10 reps;Limitations             PT Education - 03/12/18 1148    Education Details  Progressed HEP this date; given red theraband    Person(s) Educated  Patient    Methods  Handout    Comprehension  Verbalized understanding          PT Long Term Goals - 02/06/18 0857      PT LONG TERM GOAL #1   Title  patient to be independent with advanced HEP    Status  On-going      PT LONG TERM GOAL #2   Title  patient to improve B LE strength to >/= 4+/5 for improved function    Status  On-going      PT LONG TERM GOAL #3   Title  patient  to demonstrate reciprocal stair navigation up/down 1 flight wihtout pain limiting    Status  On-going      PT LONG TERM GOAL #4   Title  patient to report improved mobility with reduced pain    Status  On-going      PT LONG TERM GOAL #5   Title  shoulder goals TBD    Status  On-going            Plan - 03/12/18 1150    Clinical Impression Statement  Patient reports she is doing well and believes her walking has improved. Performed STS with tactile cues/UE support in order to promote forward trunk lean and placing feet back. Tolerated progressive LE strengthening with verbal/tactile cues for technique. Patient educated on updated HEP and given red TB this date. Will plan to d/c next session.    PT Treatment/Interventions  ADLs/Self Care Home Management;Cryotherapy;Electrical Stimulation;Iontophoresis /ml Dexamethasone;Moist Heat;Therapeutic exercise;Therapeutic activities;Functional mobility training;Stair training;Balance training;Neuromuscular re-education;Ultrasound;Patient/family education;Manual techniques;Vasopneumatic Device;Taping;Dry needling;Passive range of motion    Consulted and Agree with Plan of Care  Patient       Patient will benefit from skilled  therapeutic intervention in order to improve the following deficits and impairments:  Pain, Decreased strength, Decreased mobility, Postural dysfunction, Decreased activity tolerance  Visit Diagnosis: Chronic pain of right knee  Chronic pain of left knee  Chronic right shoulder pain  Other symptoms and signs involving the musculoskeletal system     Problem List Patient Active Problem List   Diagnosis Date Noted  . AC (acromioclavicular) arthritis 12/21/2017  . Essential hypertension 11/28/2017  . Degenerative arthritis of knee, bilateral 07/15/2015  . De Quervain's disease (tenosynovitis) 09/15/2011  . METATARSALGIA 07/14/2010  . BUNIONS, BILATERAL 07/14/2010  . FOOT PAIN, BILATERAL 07/14/2010  . BACK PAIN, THORACIC REGION 03/30/2010    Anette Guarneri, PT, DPT 03/12/18 11:59 AM   [facility] [address]  [address] [place], [place], [address] Phone: [phone]   Fax:  [phone]  Name: ARLIENE ROSENOW MRN: [id number] Date of Birth: 1936-07-13

## 2018-03-14 ENCOUNTER — Encounter: Payer: Self-pay | Admitting: Physical Therapy

## 2018-03-14 ENCOUNTER — Ambulatory Visit: Payer: Medicare Other | Admitting: Physical Therapy

## 2018-03-14 DIAGNOSIS — M25511 Pain in right shoulder: Secondary | ICD-10-CM

## 2018-03-14 DIAGNOSIS — M25562 Pain in left knee: Secondary | ICD-10-CM

## 2018-03-14 DIAGNOSIS — M25561 Pain in right knee: Secondary | ICD-10-CM | POA: Diagnosis not present

## 2018-03-14 DIAGNOSIS — R29898 Other symptoms and signs involving the musculoskeletal system: Secondary | ICD-10-CM

## 2018-03-14 DIAGNOSIS — G8929 Other chronic pain: Secondary | ICD-10-CM

## 2018-03-14 NOTE — Therapy (Signed)
[facility] [address]  [place], [place], [address] Phone: [phone]   Fax:  [phone]  Physical Therapy Treatment  Patient Details  Name: Katie Johnson MRN: [id number] Date of Birth: Nov 18, 1935 Referring Provider: Dr. Hulan Saas   Progress Note Reporting Period 01/30/2018 to 03/14/2018  See note below for Objective Data and Assessment of Progress/Goals.      Encounter Date: 03/14/2018  PT End of Session - 03/14/18 1807    Visit Number  11    Number of Visits  12    Date for PT Re-Evaluation  03/13/18    Authorization Type  UHC Medicare    PT Start Time  6160    PT Stop Time  1615    PT Time Calculation (min)  36 min    Activity Tolerance  Patient tolerated treatment well    Behavior During Therapy  [facility] for tasks assessed/performed       Past Medical History:  Diagnosis Date  . Arthritis   . Chicken pox   . GERD (gastroesophageal reflux disease)   . High blood pressure   . Hypertension     History reviewed. No pertinent surgical history.  There were no vitals filed for this visit.  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 Was also able to go golfing today and her knee was not bothering her. Reports significant improvement in her ability to function overall. Has been compliant with HEP.    Pertinent History  HTN    Diagnostic tests  nothing recently    Patient Stated Goals  walk up and down stairs at living community without pain    Currently in Pain?  No/denies                       [facility] Adult PT Treatment/Exercise - 03/14/18 0001      Knee/Hip Exercises: Aerobic   Nustep  L5 x 6 min      Knee/Hip Exercises: Standing   Terminal Knee Extension  Strengthening;Left;15 reps;Theraband    Theraband Level (Terminal Knee Extension)  Level 4 (Blue)    Other  Standing Knee Exercises  Mini wall squats to tolerance 10x      Knee/Hip Exercises: Seated   Long Arc Quad  Strengthening;Left;Weights;10 reps;Right    Long Arc Quad Weight  2 lbs.    Sit to Sand  5 reps;without UE support;2 sets 1 set with red TB around knees      Knee/Hip Exercises: Supine   Bridges with Clamshell  15 reps;Strengthening;Limitations Bridge with red TB around knees             PT Education - 03/14/18 1806    Education provided  Yes    Education Details  Explained to patient how to progress HEP with new TB    Person(s) Educated  Patient    Methods  Demonstration    Comprehension  Verbalized understanding          PT Long Term Goals - 03/14/18 1548      PT LONG TERM GOAL #1   Title  patient to be independent with advanced HEP    Status  Achieved      PT LONG TERM GOAL #2   Title  patient to improve B LE strength to >/= 4+/5 for improved function  Status  Achieved      PT LONG TERM GOAL #3   Title  patient to demonstrate reciprocal stair navigation up/down 1 flight wihtout pain limiting    Status  Partially Met patient reports she is still climbing stairs step-to but able to od it faster than before      PT LONG TERM GOAL #4   Title  patient to report improved mobility with reduced pain    Status  Achieved Patient able to go golfing and volunteer all day without pain.      PT LONG TERM GOAL #5   Title  shoulder goals TBD    Status  Unable to assess Not addressed this session due to no shoulder complaints            Plan - 03/14/18 1809    Clinical Impression Statement  Patient reports significant improvement in her ability to function since starting PT. Able to perform volunteering duties as well as leisure activities like golfing without pain. Also reports tasks like climbing stairs and standing up from toilet are pain free.  Patient has met or nearly met all goals. Is compliant with HEP and was given new TB this date in order to be able to  progress exercises as tolerated at home. Patient agreeable for D/C at this time.     PT Treatment/Interventions  ADLs/Self Care Home Management;Cryotherapy;Electrical Stimulation;Iontophoresis '4mg'$ /ml Dexamethasone;Moist Heat;Therapeutic exercise;Therapeutic activities;Functional mobility training;Stair training;Balance training;Neuromuscular re-education;Ultrasound;Patient/family education;Manual techniques;Vasopneumatic Device;Taping;Dry needling;Passive range of motion    Consulted and Agree with Plan of Care  Patient       Patient will benefit from skilled therapeutic intervention in order to improve the following deficits and impairments:  Pain, Decreased strength, Decreased mobility, Postural dysfunction, Decreased activity tolerance  Visit Diagnosis: Chronic pain of right knee  Chronic pain of left knee  Other symptoms and signs involving the musculoskeletal system  Chronic right shoulder pain     Problem List Patient Active Problem List   Diagnosis Date Noted  . AC (acromioclavicular) arthritis 12/21/2017  . Essential hypertension 11/28/2017  . Degenerative arthritis of knee, bilateral 07/15/2015  . De Quervain's disease (tenosynovitis) 09/15/2011  . METATARSALGIA 07/14/2010  . BUNIONS, BILATERAL 07/14/2010  . FOOT PAIN, BILATERAL 07/14/2010  . BACK PAIN, THORACIC REGION 03/30/2010    PHYSICAL THERAPY DISCHARGE SUMMARY  Visits from Start of Care: 11  Current functional level related to goals / functional outcomes: See above assessment   Remaining deficits: Unable to reciprocally negotiate stairs   Education / Equipment: Educated on HEP and given theraband to progress as tolerated  Plan: Patient agrees to discharge.  Patient goals were partially met. Patient is being discharged due to being pleased with the current functional level.  ?????      Janene Harvey, PT, DPT 03/14/18 6:20 PM  [facility] [address]  [place], [place], [address] Phone: [phone]   Fax:  [phone]  Name: Katie Johnson MRN: [id number] Date of Birth: November 13, 1935

## 2018-05-28 NOTE — Progress Notes (Signed)
Tawana ScaleZach Smith D.O. [facility] [address] [place], [place] [address] Phone: [phone] Subjective:    I'm seeing this patient by the request  of:    CC: Shoulder and knee pain  BJY:[id number]:Subjective  Katie Johnson is a 82 y.o. female coming in with complaint of shoulder and knee pain. States the knees are better. She has started lifting light weights. Still can't go up and down stairs. Posterior shoulder pain radiating down her back a little past her waist. Pain wakes her up at night. Has no history of injections in the shoulder. Recalls hearing a pop in her shoulder while playing golf that resulted in an increase in pain and a loss of ROM. She has regained her ROM however the pain persist.        Past Medical History:  Diagnosis Date  . Arthritis   . Chicken pox   . GERD (gastroesophageal reflux disease)   . High blood pressure   . Hypertension    No past surgical history on file. Social History   Socioeconomic History  . Marital status: Married    Spouse name: Not on file  . Number of children: Not on file  . Years of education: Not on file  . Highest education level: Not on file  Occupational History  . Not on file  Social Needs  . Financial resource strain: Not on file  . Food insecurity:    Worry: Not on file    Inability: Not on file  . Transportation needs:    Medical: Not on file    Non-medical: Not on file  Tobacco Use  . Smoking status: Never Smoker  . Smokeless tobacco: Never Used  Substance and Sexual Activity  . Alcohol use: Not on file  . Drug use: Not on file  . Sexual activity: Not on file  Lifestyle  . Physical activity:    Days per week: Not on file    Minutes per session: Not on file  . Stress: Not on file  Relationships  . Social connections:    Talks on phone: Not on file    Gets together: Not on file    Attends religious service: Not on file    Active member of club or organization: Not on file    Attends meetings of clubs or  organizations: Not on file    Relationship status: Not on file  Other Topics Concern  . Not on file  Social History Narrative  . Not on file   Allergies  Allergen Reactions  . Shellfish-Derived Products   . Ketoprofen Rash   Family History  Problem Relation Age of Onset  . COPD Mother   . Heart disease Mother   . Diabetes Father   . Heart disease Father      Past medical history, social, surgical and family history all reviewed in electronic medical record.  No pertanent information unless stated regarding to the chief complaint.   Review of Systems:Review of systems updated and as accurate as of 05/29/18  No headache, visual changes, nausea, vomiting, diarrhea, constipation, dizziness, abdominal pain, skin rash, fevers, chills, night sweats, weight loss, swollen lymph nodes, body aches, joint swelling,  chest pain, shortness of breath, mood changes.  Positive muscle aches  Objective  Blood pressure 140/74, pulse 69, height 5\' 4"  (1.626 m), weight 167 lb (75.8 kg), SpO2 97 %. Systems examined below as of 05/29/18   General: No apparent distress alert and oriented x3 mood and affect  normal, dressed appropriately.  HEENT: Pupils equal, extraocular movements intact  Respiratory: Patient's speak in full sentences and does not appear short of breath  Cardiovascular: No lower extremity edema, non tender, no erythema  Skin: Warm dry intact with no signs of infection or rash on extremities or on axial skeleton.  Abdomen: Soft nontender  Neuro: Cranial nerves II through XII are intact, neurovascularly intact in all extremities with 2+ DTRs and 2+ pulses.  Lymph: No lymphadenopathy of posterior or anterior cervical chain or axillae bilaterally.  Gait normal with good balance and coordination.  MSK:  Non tender with full range of motion and good stability and symmetric strength and tone of shoulders, elbows, wrist, hip, and ankles bilaterally.  Knees have arthritic changes with some mild  discomfort over the patellofemoral joint on the left with some crepitus noted.  Positive grinding.  Mild instability with valgus force.  No swelling noted today.  Shoulder exam shows the patient does have tenderness over the right acromioclavicular joint.  Moderate arthritic changes with crepitus changes.  Positive crossover.  Mild impingement but full range of motion of the rest of the shoulder.  Ultrasound-guided procedure:  Injection of right acromioclavicular joint Consent obtained and verified. Time-out conducted. Noted no overlying erythema, induration, or other signs of local infection. Skin prepped in a sterile fashion. Topical analgesic spray: Ethyl chloride. Completed without difficulty. Meds: 0.5 cc of 0.5% Marcaine and 0.5 cc of Kenalog 40 mg/mL Pain immediately improved suggesting accurate placement of the medication.   Advised to call if fevers/chills, erythema, induration, drainage, or persistent bleeding.    Impression and Recommendations:     This case required medical decision making of moderate complexity.      Note: This dictation was prepared with Dragon dictation along with smaller phrase technology. Any transcriptional errors that result from this process are unintentional.

## 2018-05-29 ENCOUNTER — Ambulatory Visit: Payer: Medicare Other | Admitting: Family Medicine

## 2018-05-29 ENCOUNTER — Encounter: Payer: Self-pay | Admitting: Family Medicine

## 2018-05-29 VITALS — BP 140/74 | HR 69 | Ht 64.0 in | Wt 167.0 lb

## 2018-05-29 DIAGNOSIS — M19011 Primary osteoarthritis, right shoulder: Secondary | ICD-10-CM

## 2018-05-29 DIAGNOSIS — M170 Bilateral primary osteoarthritis of knee: Secondary | ICD-10-CM

## 2018-05-29 DIAGNOSIS — G8929 Other chronic pain: Secondary | ICD-10-CM | POA: Diagnosis not present

## 2018-05-29 DIAGNOSIS — M25511 Pain in right shoulder: Secondary | ICD-10-CM

## 2018-05-29 NOTE — Assessment & Plan Note (Signed)
Injected today and tolerated procedure well.  Encourage topical anti-inflammatory discussed icing regimen and home exercise.  Discussed which activities to do which wants to avoid.  Patient will follow-up again in 4 to 6 weeks

## 2018-05-29 NOTE — Assessment & Plan Note (Signed)
Stable.  No changes in management. 

## 2018-05-29 NOTE — Patient Instructions (Signed)
Good to see you  Katie Johnson is your friend.  PT will be calling you  Stay active Keep hands within peripheral vision  Lets continue to watch the knees.  [facility]  See me again in 4-6 weeks

## 2018-06-02 NOTE — Progress Notes (Addendum)
[facility] [address], [address] [place], [place] [address] [phone]  Date:  06/04/2018   Name:  Katie Johnson   DOB:  [birth year]   MRN:  [id number]  PCP:  Pearline Cables, MD    Chief Complaint: Hypertension (6 month follow up)   History of Present Illness:  Katie Johnson is a 82 y.o. very pleasant female patient who presents with the following:  Periodic follow-up visit today I saw her as a new patent in January She has various MSK concerns, and also HTN for which she uses Lotrel  Due for labs today if she would like - last done in April of 2018 Shingrix? Asked pt about this, she would like to start this.  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  She is also seeing DR. 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 She also did diabetes education, and worked with cardiac patients She did her master's thesis on [employer] She and her husband have been married for 52 years, they did meet in [place].   They do not have children   She is doing PT for her knee and hopes to be able to avoid a knee replacement. The PT is really helping  She is still taking her lotrel with success- no concerns here  Last mammo was in 2017.  We had a detailed discussion about if she should continue mammogram- she thought she was too old. Advised that there is no cut off age for stopping mammograms, and as she is in good health I would encourage her to continue to do routine screening. Ordered mammo for her  today Patient Active Problem List   Diagnosis Date Noted  . AC (acromioclavicular) arthritis 12/21/2017  . Essential hypertension 11/28/2017  . Degenerative arthritis of knee, bilateral 07/15/2015  . De Quervain's disease (tenosynovitis) 09/15/2011  . METATARSALGIA 07/14/2010  . BUNIONS, BILATERAL 07/14/2010  . FOOT PAIN, BILATERAL 07/14/2010  . BACK PAIN, THORACIC REGION 03/30/2010    Past Medical History:  Diagnosis Date  . Arthritis   . Chicken pox   . GERD (gastroesophageal reflux disease)   . High blood pressure   . Hypertension     No past surgical history on file.  Social History   Tobacco Use  . Smoking status: Never Smoker  . Smokeless tobacco: Never Used  Substance Use Topics  . Alcohol use: Not on file  . Drug use: Not on file    Family History  Problem Relation Age of Onset  . COPD Mother   . Heart disease Mother   . Diabetes Father   . Heart disease Father     Allergies  Allergen Reactions  . Pravastatin Diarrhea  . Shellfish-Derived Products   . Ketoprofen Rash    Medication list has been reviewed and updated.  Current Outpatient Medications on File Prior to Visit  Medication Sig Dispense Refill  . amLODipine-benazepril (LOTREL) 10-20 MG capsule Take 1 capsule by mouth  daily. 90 capsule 3  . cholecalciferol (VITAMIN D) 1000 units tablet Take 2,000 Units by mouth daily.    . diclofenac sodium (VOLTAREN) 1 % GEL Apply 2 g topically 4 (four) times daily. 1 Tube 3  . Multiple Vitamins-Minerals (CENTRUM ADULTS PO) Take by mouth.     No current facility-administered medications on file prior to visit.     Review of Systems:  As per HPI- otherwise negative. No fever or chills No CP or SOB Feeling quite well for her age    Physical Examination: Vitals:   06/04/18 1559  BP: 132/84  Pulse: 64  Resp: 16  SpO2: 98%   Vitals:   06/04/18 1559  Weight: 165 lb (74.8 kg)  Height: 5\' 4"  (1.626 m)   Body mass index is 28.32 kg/m. Ideal  Body Weight: Weight in (lb) to have BMI = 25: 145.3  GEN: WDWN, NAD, Non-toxic, A & O x 3, looks well, overweight  HEENT: Atraumatic, Normocephalic. Neck supple. No masses, No LAD. Ears and Nose: No external deformity. CV: RRR, No M/G/R. No JVD. No thrill. No extra heart sounds. PULM: CTA B, no wheezes, crackles, rhonchi. No retractions. No resp. distress. No accessory muscle use. EXTR: No c/c/e NEURO Normal gait.  PSYCH: Normally interactive. Conversant. Not depressed or anxious appearing.  Calm demeanor.    Assessment and Plan: Essential hypertension - Plan: Comprehensive metabolic panel  Screening for deficiency anemia - Plan: CBC  Screening for hyperlipidemia - Plan: Lipid panel  Screening for breast cancer - Plan: MM 3D SCREEN BREAST BILATERAL  Immunization due - Plan: CANCELED: Varicella-zoster vaccine IM (Shingrix)  Routine labs pending BP is under good control, continue current regimen  mammo ordered for her today shingrix to be done at pharmacy    Signed Abbe Amsterdam, MD  Received her labs 8/1- message to pt  Blood counts are normal Metabolic profile is ok except your BUN is a bit elevated and GFR- kidney filtration rate- is slightly low. This may indicate that you were a bit dehydrated.  Let's repeat a kidney function panel in about a month- I will order this for you as a lab visit only.  Your total to HDL (good cholesterol) ratio is favorable, but your absolute cholesterol is a bit high.  If you would like, a cholesterol med might decrease your risk of cardiovascular disease later on.  I can start this med for you if you are interested.    Otherwise, please come in for the kidney panel (lab appointment only) in one month and let's visit in 6 months  Results for orders placed or performed in visit on 06/04/18  CBC  Result Value Ref Range   WBC 7.7 4.0 - 10.5 K/uL   RBC 4.20 3.87 - 5.11 Mil/uL   Platelets 206.0 150.0 - 400.0 K/uL   Hemoglobin 13.8 12.0 -  15.0 g/dL   HCT 16.1 09.6 - 04.5 %   MCV 96.7 78.0 - 100.0 fl   MCHC 34.0 30.0 - 36.0 g/dL   RDW 40.9 81.1 - 91.4 %  Comprehensive metabolic panel  Result Value Ref Range   Sodium 138 135 - 145 mEq/L   Potassium 4.6 3.5 - 5.1 mEq/L   Chloride 104 96 - 112 mEq/L   CO2 25 19 - 32 mEq/L   Glucose, Bld 105 (H) 70 - 99 mg/dL   BUN 35 (H) 6 - 23 mg/dL   Creatinine, Ser 7.82 0.40 - 1.20 mg/dL   Total Bilirubin 0.3 0.2 -  1.2 mg/dL   Alkaline Phosphatase 79 39 - 117 U/L   AST 13 0 - 37 U/L   ALT 14 0 - 35 U/L   Total Protein 7.5 6.0 - 8.3 g/dL   Albumin 4.5 3.5 - 5.2 g/dL   Calcium 9.8 8.4 - 69.610.5 mg/dL   GFR 29.5249.24 (L) >84.13>60.00 mL/min  Lipid panel  Result Value Ref Range   Cholesterol 247 (H) 0 - 200 mg/dL   Triglycerides 244.0243.0 (H) 0.0 - 149.0 mg/dL   HDL 10.2771.60 >25.36>39.00 mg/dL   VLDL 64.448.6 (H) 0.0 - 03.440.0 mg/dL   Total CHOL/HDL Ratio 3    NonHDL 175.25   LDL cholesterol, direct  Result Value Ref Range   Direct LDL 130.0 mg/dL

## 2018-06-04 ENCOUNTER — Encounter: Payer: Self-pay | Admitting: Family Medicine

## 2018-06-04 ENCOUNTER — Ambulatory Visit: Payer: Medicare Other | Admitting: Family Medicine

## 2018-06-04 VITALS — BP 132/84 | HR 64 | Resp 16 | Ht 64.0 in | Wt 165.0 lb

## 2018-06-04 DIAGNOSIS — Z130 Encounter for screening for diseases of the blood and blood-forming organs and certain disorders involving the immune mechanism: Secondary | ICD-10-CM | POA: Diagnosis not present

## 2018-06-04 DIAGNOSIS — Z1231 Encounter for screening mammogram for malignant neoplasm of breast: Secondary | ICD-10-CM | POA: Diagnosis not present

## 2018-06-04 DIAGNOSIS — Z23 Encounter for immunization: Secondary | ICD-10-CM

## 2018-06-04 DIAGNOSIS — I10 Essential (primary) hypertension: Secondary | ICD-10-CM | POA: Diagnosis not present

## 2018-06-04 DIAGNOSIS — Z13220 Encounter for screening for lipoid disorders: Secondary | ICD-10-CM | POA: Diagnosis not present

## 2018-06-04 DIAGNOSIS — Z1239 Encounter for other screening for malignant neoplasm of breast: Secondary | ICD-10-CM

## 2018-06-04 DIAGNOSIS — R944 Abnormal results of kidney function studies: Secondary | ICD-10-CM

## 2018-06-04 NOTE — Patient Instructions (Addendum)
Your blood pressure looks fine today -continue your current medication  Please get your shingrix vaccine at a major pharmacy such as [facility] or [facility]- sorry I had forgotten that Medicare will not cover this shot if given at our office We will get routine labs for you and be in touch asap  Please do set up a mammogram downstairs at your convenience- I placed this order for you today

## 2018-06-05 LAB — COMPREHENSIVE METABOLIC PANEL
ALBUMIN: 4.5 g/dL (ref 3.5–5.2)
ALT: 14 U/L (ref 0–35)
AST: 13 U/L (ref 0–37)
Alkaline Phosphatase: 79 U/L (ref 39–117)
BUN: 35 mg/dL — AB (ref 6–23)
CO2: 25 meq/L (ref 19–32)
Calcium: 9.8 mg/dL (ref 8.4–10.5)
Chloride: 104 mEq/L (ref 96–112)
Creatinine, Ser: 1.13 mg/dL (ref 0.40–1.20)
GFR: 49.24 mL/min — ABNORMAL LOW (ref >60.00)
GLUCOSE: 105 mg/dL — AB (ref 70–99)
POTASSIUM: 4.6 meq/L (ref 3.5–5.1)
SODIUM: 138 meq/L (ref 135–145)
Total Bilirubin: 0.3 mg/dL (ref 0.2–1.2)
Total Protein: 7.5 g/dL (ref 6.0–8.3)

## 2018-06-05 LAB — LIPID PANEL
CHOL/HDL RATIO: 3
Cholesterol: 247 mg/dL — ABNORMAL HIGH (ref 0–200)
HDL: 71.6 mg/dL (ref >39.00)
NONHDL: 175.25
Triglycerides: 243 mg/dL — ABNORMAL HIGH (ref 0.0–149.0)
VLDL: 48.6 mg/dL — AB (ref 0.0–40.0)

## 2018-06-05 LAB — CBC
HCT: 40.6 % (ref 36.0–46.0)
Hemoglobin: 13.8 g/dL (ref 12.0–15.0)
MCHC: 34 g/dL (ref 30.0–36.0)
MCV: 96.7 fl (ref 78.0–100.0)
Platelets: 206 10*3/uL (ref 150.0–400.0)
RBC: 4.2 Mil/uL (ref 3.87–5.11)
RDW: 12.9 % (ref 11.5–15.5)
WBC: 7.7 10*3/uL (ref 4.0–10.5)

## 2018-06-05 LAB — LDL CHOLESTEROL, DIRECT: LDL DIRECT: 130 mg/dL

## 2018-06-07 ENCOUNTER — Encounter: Payer: Self-pay | Admitting: Family Medicine

## 2018-06-07 NOTE — Addendum Note (Signed)
Addended by: Abbe AmsterdamOPLAND, Jahid Weida C on: 06/07/2018 05:26 AM   Modules accepted: Orders

## 2018-06-27 ENCOUNTER — Ambulatory Visit (HOSPITAL_BASED_OUTPATIENT_CLINIC_OR_DEPARTMENT_OTHER)
Admission: RE | Admit: 2018-06-27 | Discharge: 2018-06-27 | Disposition: A | Discharge status: Home / Self Care | Payer: Medicare Other | Source: Ambulatory Visit | Attending: Family Medicine | Admitting: Family Medicine

## 2018-06-27 DIAGNOSIS — Z1231 Encounter for screening mammogram for malignant neoplasm of breast: Secondary | ICD-10-CM | POA: Insufficient documentation

## 2018-06-27 DIAGNOSIS — Z1239 Encounter for other screening for malignant neoplasm of breast: Secondary | ICD-10-CM

## 2018-07-02 NOTE — Progress Notes (Signed)
Tawana ScaleZach Kemberly Taves D.O. [facility] [address] [place], [place] [address] Phone: [phone]   Subjective:   Bruce Donath, Valerie Wolf, am serving as a scribe for Dr. Antoine PrimasZachary Dotsie Gillette.   CC: Bilateral knee pain  NGE:[id number]:Subjective  Katie Johnson is a 82 y.o. female coming in with complaint of shoulder and knee pain. Has injection last visit in her shoulder. Pain has improved and feels better with stretching. Was doing inverted push ups at yoga yesterday and her pain increased.  Patient will states that it is not stopping her from daily activities  Patient discusses not being able to climb stairs due to left knee pain. She is going to sell her mountain condo due to not being able to navigate the stairs. Patient states that pain is deep in the joint. Patient rides a bike at home and does strength exercises. She is concerned as she is going to [place] the end of October and would like to climb stairs.  States both knees are severe enough that it is affecting daily activities.  Increasing swelling.  Previous x-rays and ultrasounds were independently visualized by me before visit    Past Medical History:  Diagnosis Date  . Arthritis   . Chicken pox   . GERD (gastroesophageal reflux disease)   . High blood pressure   . Hypertension    No past surgical history on file. Social History   Socioeconomic History  . Marital status: Married    Spouse name: Not on file  . Number of children: Not on file  . Years of education: Not on file  . Highest education level: Not on file  Occupational History  . Not on file  Social Needs  . Financial resource strain: Not on file  . Food insecurity:    Worry: Not on file    Inability: Not on file  . Transportation needs:    Medical: Not on file    Non-medical: Not on file  Tobacco Use  . Smoking status: Never Smoker  . Smokeless tobacco: Never Used  Substance and Sexual Activity  . Alcohol use: Not on file  . Drug use: Not on file  . Sexual activity:  Not on file  Lifestyle  . Physical activity:    Days per week: Not on file    Minutes per session: Not on file  . Stress: Not on file  Relationships  . Social connections:    Talks on phone: Not on file    Gets together: Not on file    Attends religious service: Not on file    Active member of club or organization: Not on file    Attends meetings of clubs or organizations: Not on file    Relationship status: Not on file  Other Topics Concern  . Not on file  Social History Narrative  . Not on file   Allergies  Allergen Reactions  . Pravastatin Diarrhea  . Shellfish-Derived Products   . Ketoprofen Rash   Family History  Problem Relation Age of Onset  . COPD Mother   . Heart disease Mother   . Diabetes Father   . Heart disease Father      Past medical history, social, surgical and family history all reviewed in electronic medical record.  No pertanent information unless stated regarding to the chief complaint.   Review of Systems:  No headache, visual changes, nausea, vomiting, diarrhea, constipation, dizziness, abdominal pain, skin rash, fevers, chills, night sweats, weight loss, swollen lymph nodes, body  aches, joint swelling, , chest pain, shortness of breath, mood changes.  Positive muscle aches  Objective  Blood pressure 138/80, pulse 67, height 5\' 4"  (1.626 m), weight 165 lb (74.8 kg), SpO2 97 %. Systems examined below as of    General: No apparent distress alert and oriented x3 mood and affect normal, dressed appropriately.  HEENT: Pupils equal, extraocular movements intact  Respiratory: Patient's speak in full sentences and does not appear short of breath  Cardiovascular: Trace lower extremity edema, non tender, no erythema  Skin: Warm dry intact with no signs of infection or rash on extremities or on axial skeleton.  Abdomen: Soft nontender  Neuro: Cranial nerves II through XII are intact, neurovascularly intact in all extremities with 2+ DTRs and 2+ pulses.    Lymph: No lymphadenopathy of posterior or anterior cervical chain or axillae bilaterally.  Gait antalgic walking with the aid of a cane MSK:  Non tender with full range of motion and good stability and symmetric strength and tone of shoulders, elbows, wrist, hip, and ankles bilaterally.  Significant arthritic changes of multiple joints Knee: Bilateral valgus deformity noted. Large thigh to calf ratio.  Tender to palpation over medial and PF joint line.  ROM full in flexion and extension and lower leg rotation. instability with valgus force.  painful patellar compression. Patellar glide with moderate crepitus. Patellar and quadriceps tendons unremarkable. Hamstring and quadriceps strength is normal.  After informed written and verbal consent, patient was seated on exam table. Right knee was prepped with alcohol swab and utilizing anterolateral approach, patient's right knee space was injected with 4:1  marcaine 0.5%: Kenalog 40mg /dL. Patient tolerated the procedure well without immediate complications.  After informed written and verbal consent, patient was seated on exam table. Left knee was prepped with alcohol swab and utilizing anterolateral approach, patient's left knee space was injected with 4:1  marcaine 0.5%: Kenalog 40mg /dL. Patient tolerated the procedure well without immediate complications.   Impression and Recommendations:     This case required medical decision making of moderate complexity.  The above documentation has been reviewed and is accurate and complete Judi Saa      Note: This dictation was prepared with Dragon dictation along with smaller phrase technology. Any transcriptional errors that result from this process are unintentional.

## 2018-07-03 ENCOUNTER — Encounter: Payer: Self-pay | Admitting: Family Medicine

## 2018-07-03 ENCOUNTER — Ambulatory Visit: Payer: Medicare Other | Admitting: Family Medicine

## 2018-07-03 DIAGNOSIS — M170 Bilateral primary osteoarthritis of knee: Secondary | ICD-10-CM | POA: Diagnosis not present

## 2018-07-03 NOTE — Assessment & Plan Note (Addendum)
Worsening symptoms bilateral injections given.  Discussed icing regimen and home exercises.  Discussed which activities of doing which wants to avoid.  Increase activity slowly over the course the next several days.  Follow-up with me again in 4 weeks.  Could start Visco supplementation at that point.

## 2018-07-03 NOTE — Patient Instructions (Addendum)
Good to see you  Ice is your friend Ice 20 minutes 2 times daily. Usually after activity and before bed. pennsaid pinkie amount topically 2 times daily as needed.  Injected both knees today  Stay active Lets also  Get approval for the other injections See me again in 4 weeks.

## 2018-07-04 ENCOUNTER — Other Ambulatory Visit (INDEPENDENT_AMBULATORY_CARE_PROVIDER_SITE_OTHER): Payer: Medicare Other

## 2018-07-04 DIAGNOSIS — R944 Abnormal results of kidney function studies: Secondary | ICD-10-CM | POA: Diagnosis not present

## 2018-07-05 LAB — BASIC METABOLIC PANEL
BUN: 39 mg/dL — ABNORMAL HIGH (ref 6–23)
CHLORIDE: 101 meq/L (ref 96–112)
CO2: 23 mEq/L (ref 19–32)
Calcium: 9.7 mg/dL (ref 8.4–10.5)
Creatinine, Ser: 1.57 mg/dL — ABNORMAL HIGH (ref 0.40–1.20)
GFR: 33.69 mL/min — ABNORMAL LOW (ref >60.00)
Glucose, Bld: 119 mg/dL — ABNORMAL HIGH (ref 70–99)
Potassium: 4.8 mEq/L (ref 3.5–5.1)
Sodium: 135 mEq/L (ref 135–145)

## 2018-07-06 ENCOUNTER — Encounter: Payer: Self-pay | Admitting: Family Medicine

## 2018-07-06 ENCOUNTER — Telehealth: Payer: Self-pay | Admitting: Family Medicine

## 2018-07-06 NOTE — Telephone Encounter (Signed)
error 

## 2018-07-10 NOTE — Progress Notes (Signed)
[facility] [address], [address] [place], [place] [address] [phone]  Date:  07/12/2018   Name:  Katie Johnson   DOB:  1935/12/20   MRN:  [id number]  PCP:  Pearline Cables, MD    Chief Complaint: Lab Results (kidney function worsening)   History of Present Illness:  Katie Johnson is a 82 y.o. very pleasant female patient who presents with the following:  Here today to look at her rising renal function labs-    Chemistry      Component Value Date/Time   NA 135 07/04/2018 1555   K 4.8 07/04/2018 1555   CL 101 07/04/2018 1555   CO2 23 07/04/2018 1555   BUN 39 (H) 07/04/2018 1555   CREATININE 1.57 (H) 07/04/2018 1555      Component Value Date/Time   CALCIUM 9.7 07/04/2018 1555   ALKPHOS 79 06/04/2018 1626   AST 13 06/04/2018 1626   ALT 14 06/04/2018 1626   BILITOT 0.3 06/04/2018 1626     I have a BMP from 2014 at which time her creat was normal- then in July and august her BUN and then her creat started to rise, GFR was in the 30s at most recent check BP is well controlled  Pt notes that her GM did have kidney disease but otherwise she is not aware of any family history of same  Also looking back in care everywhere I am able to see some additional chem panels and her renal function was ok previously   She does take tylenol on occasion but does not generally take NSAIDs  She feels just fine   She has been on her BP med for 20+ years - no recent change   No LE swelling   BP Readings from Last 3 Encounters:  07/12/18 112/60  07/03/18 138/80  06/04/18 132/84     Patient Active Problem List   Diagnosis Date Noted  . AC (acromioclavicular) arthritis 12/21/2017  . Essential hypertension 11/28/2017  . Degenerative arthritis of knee, bilateral 07/15/2015  . De Quervain's disease (tenosynovitis) 09/15/2011  . METATARSALGIA 07/14/2010  . BUNIONS, BILATERAL 07/14/2010  . FOOT PAIN, BILATERAL 07/14/2010  . BACK  PAIN, THORACIC REGION 03/30/2010    Past Medical History:  Diagnosis Date  . Arthritis   . Chicken pox   . GERD (gastroesophageal reflux disease)   . High blood pressure   . Hypertension     No past surgical history on file.  Social History   Tobacco Use  . Smoking status: Never Smoker  . Smokeless tobacco: Never Used  Substance Use Topics  . Alcohol use: Not on file  . Drug use: Not on file    Family History  Problem Relation Age of Onset  . COPD Mother   . Heart disease Mother   . Diabetes Father   . Heart disease Father     Allergies  Allergen Reactions  . Statins Diarrhea and Nausea And Vomiting  . Pravastatin Diarrhea  . Shellfish-Derived Products   . Ketoprofen Rash    Medication list has been reviewed and updated.  Current Outpatient Medications on File Prior to Visit  Medication Sig Dispense Refill  . amLODipine-benazepril (LOTREL) 10-20 MG capsule Take 1 capsule by mouth daily. 90 capsule 3  . cholecalciferol (VITAMIN D) 1000 units tablet Take 2,000 Units by mouth daily.    . diclofenac sodium (VOLTAREN) 1 % GEL Apply 2 g topically  4 (four) times daily. 1 Tube 3  . Multiple Vitamins-Minerals (CENTRUM ADULTS PO) Take by mouth.     No current facility-administered medications on file prior to visit.     Review of Systems:  As per HPI- otherwise negative.   Physical Examination: Vitals:   07/12/18 1349  BP: 112/60  Pulse: 78  Resp: 16  Temp: 97.8 F (36.6 C)  SpO2: 98%   Vitals:   07/12/18 1349  Weight: 164 lb (74.4 kg)  Height: 5\' 4"  (1.626 m)   Body mass index is 28.15 kg/m. Ideal Body Weight: Weight in (lb) to have BMI = 25: 145.3  GEN: WDWN, NAD, Non-toxic, A & O x 3, looks well and younger than age  HEENT: Atraumatic, Normocephalic. Neck supple. No masses, No LAD. Ears and Nose: No external deformity. CV: RRR, No M/G/R. No JVD. No thrill. No extra heart sounds. PULM: CTA B, no wheezes, crackles, rhonchi. No retractions. No  resp. distress. No accessory muscle use. EXTR: No c/c/e NEURO Normal gait.  PSYCH: Normally interactive. Conversant. Not depressed or anxious appearing.  Calm demeanor.    Assessment and Plan: Decreased GFR - Plan: Microalbumin / creatinine urine ratio, Basic metabolic panel, US Renal Artery Stenosis  Unexpected new drop in GFR Obtain urine microalbumin Repeat renal function Renal artery [place] Will plan further follow- up pending labs. Likely will need referral to nephrology   Signed Abbe Amsterdam, MD  Received her labs- message to pt  Good news- your renal function has improved some!  No protein in your urine Let's go ahead with the renal artery ultrasound- I'll be in touch with your report when it comes in and we can plan the next step  We may be able to just check again in a month or so Results for orders placed or performed in visit on 07/12/18  Microalbumin / creatinine urine ratio  Result Value Ref Range   Microalb, Ur <0.7 0.0 - 1.9 mg/dL   Creatinine,U 27.0 mg/dL   Microalb Creat Ratio 0.9 0.0 - 30.0 mg/g  Basic metabolic panel  Result Value Ref Range   Sodium 137 135 - 145 mEq/L   Potassium 4.8 3.5 - 5.1 mEq/L   Chloride 102 96 - 112 mEq/L   CO2 28 19 - 32 mEq/L   Glucose, Bld 164 (H) 70 - 99 mg/dL   BUN 31 (H) 6 - 23 mg/dL   Creatinine, Ser 7.86 0.40 - 1.20 mg/dL   Calcium 9.7 8.4 - 75.4 mg/dL   GFR 49.20 (L) >10.07 mL/min

## 2018-07-12 ENCOUNTER — Encounter: Payer: Self-pay | Admitting: Family Medicine

## 2018-07-12 ENCOUNTER — Ambulatory Visit: Payer: Medicare Other | Admitting: Family Medicine

## 2018-07-12 VITALS — BP 112/60 | HR 78 | Temp 97.8°F | Resp 16 | Ht 64.0 in | Wt 164.0 lb

## 2018-07-12 DIAGNOSIS — R944 Abnormal results of kidney function studies: Secondary | ICD-10-CM | POA: Diagnosis not present

## 2018-07-12 LAB — BASIC METABOLIC PANEL
BUN: 31 mg/dL — ABNORMAL HIGH (ref 6–23)
CALCIUM: 9.7 mg/dL (ref 8.4–10.5)
CO2: 28 mEq/L (ref 19–32)
CREATININE: 1.17 mg/dL (ref 0.40–1.20)
Chloride: 102 mEq/L (ref 96–112)
GFR: 47.29 mL/min — AB (ref >60.00)
Glucose, Bld: 164 mg/dL — ABNORMAL HIGH (ref 70–99)
Potassium: 4.8 mEq/L (ref 3.5–5.1)
SODIUM: 137 meq/L (ref 135–145)

## 2018-07-12 LAB — MICROALBUMIN / CREATININE URINE RATIO
CREATININE, U: 77.2 mg/dL
MICROALB/CREAT RATIO: 0.9 mg/g (ref 0.0–30.0)

## 2018-07-12 NOTE — Patient Instructions (Signed)
We will set you up for a renal artery ultrasound I will be in touch with your urine protein test and your blood test asap We can plan to have you see nephrology unless your kidney function has gone back to normal on it's own!

## 2018-08-01 NOTE — Progress Notes (Signed)
[name] [address] [place], [place] [address] Phone: [phone] Subjective:    [name] am serving as a Neurosurgeon for Dr. Antoine Primas.   CC: Bilateral knee pain  ONG:[id number]  Katie Johnson is a 82 y.o. female coming in with complaint of bilateral knee pain. States that the knees are a lot better. Going on a trip soon and she is not sure what she should do about her knees.  Patient states that the knees are not stopping her at all.  Does have some mild discomfort when walking long distances.  Has not noticed any significant swelling.     Past Medical History:  Diagnosis Date  . Arthritis   . Chicken pox   . GERD (gastroesophageal reflux disease)   . High blood pressure   . Hypertension    No past surgical history on file. Social History   Socioeconomic History  . Marital status: Married    Spouse name: Not on file  . Number of children: Not on file  . Years of education: Not on file  . Highest education level: Not on file  Occupational History  . Not on file  Social Needs  . Financial resource strain: Not on file  . Food insecurity:    Worry: Not on file    Inability: Not on file  . Transportation needs:    Medical: Not on file    Non-medical: Not on file  Tobacco Use  . Smoking status: Never Smoker  . Smokeless tobacco: Never Used  Substance and Sexual Activity  . Alcohol use: Not on file  . Drug use: Not on file  . Sexual activity: Not on file  Lifestyle  . Physical activity:    Days per week: Not on file    Minutes per session: Not on file  . Stress: Not on file  Relationships  . Social connections:    Talks on phone: Not on file    Gets together: Not on file    Attends religious service: Not on file    Active member of club or organization: Not on file    Attends meetings of clubs or organizations: Not on file    Relationship status: Not on file  Other Topics Concern  . Not on file  Social History  Narrative  . Not on file   Allergies  Allergen Reactions  . Statins Diarrhea and Nausea And Vomiting  . Pravastatin Diarrhea  . Shellfish-Derived Products   . Ketoprofen Rash   Family History  Problem Relation Age of Onset  . COPD Mother   . Heart disease Mother   . Diabetes Father   . Heart disease Father      Current Outpatient Medications (Cardiovascular):  .  amLODipine-benazepril (LOTREL) 10-20 MG capsule, Take 1 capsule by mouth daily.     Current Outpatient Medications (Other):  .  cholecalciferol (VITAMIN D) 1000 units tablet, Take 2,000 Units by mouth daily. .  diclofenac sodium (VOLTAREN) 1 % GEL, Apply 2 g topically 4 (four) times daily. .  Multiple Vitamins-Minerals (CENTRUM ADULTS PO), Take by mouth.    Past medical history, social, surgical and family history all reviewed in electronic medical record.  No pertanent information unless stated regarding to the chief complaint.   Review of Systems:  No headache, visual changes, nausea, vomiting, diarrhea, constipation, dizziness, abdominal pain, skin rash, fevers, chills, night sweats, weight loss, swollen lymph nodes, body aches, joint swelling,  chest pain,  shortness of breath, mood changes.  Positive muscle aches  Objective  Blood pressure 136/64, pulse 64, height 5\' 4"  (1.626 m), weight 164 lb (74.4 kg), SpO2 95 %.   General: No apparent distress alert and oriented x3 mood and affect normal, dressed appropriately.  HEENT: Pupils equal, extraocular movements intact  Respiratory: Patient's speak in full sentences and does not appear short of breath  Cardiovascular: No lower extremity edema, non tender, no erythema  Skin: Warm dry intact with no signs of infection or rash on extremities or on axial skeleton.  Abdomen: Soft nontender  Neuro: Cranial nerves II through XII are intact, neurovascularly intact in all extremities with 2+ DTRs and 2+ pulses.  Lymph: No lymphadenopathy of posterior or anterior  cervical chain or axillae bilaterally.  Gait antalgic MSK:  tender with mild limited range of motion and good stability and symmetric strength and tone of shoulders, elbows, wrist, hip, and ankles bilaterally.  Knee: Bilateral valgus deformity noted. Large thigh to calf ratio.  Tender to palpation over medial and PF joint line.  ROM full in flexion and extension and lower leg rotation. instability with valgus force.  painful patellar compression. Patellar glide with moderate crepitus. Patellar and quadriceps tendons unremarkable. Hamstring and quadriceps strength is normal.     Impression and Recommendations:     This case required medical decision making of moderate complexity. The above documentation has been reviewed and is accurate and complete Judi Saa, DO       Note: This dictation was prepared with Dragon dictation along with smaller phrase technology. Any transcriptional errors that result from this process are unintentional.

## 2018-08-02 ENCOUNTER — Encounter: Payer: Self-pay | Admitting: Family Medicine

## 2018-08-02 ENCOUNTER — Ambulatory Visit: Payer: Medicare Other | Admitting: Family Medicine

## 2018-08-02 DIAGNOSIS — M170 Bilateral primary osteoarthritis of knee: Secondary | ICD-10-CM

## 2018-08-02 NOTE — Assessment & Plan Note (Signed)
Patient feels like she is doing relatively well overall.  Patient does not want to do the Visco supplementation at this time.  Patient will continue with conservative therapy and follow-up with me again 4 weeks

## 2018-08-02 NOTE — Patient Instructions (Signed)
Good to see you  Ice is your friend Ice 20 minutes 2 times daily. Usually after activity and before bed. Continue the vitamins  See me again in 4 weeks or right before your trip just in casew we need to do injections, otherwise cancel it and see me when you need me!

## 2018-08-08 ENCOUNTER — Other Ambulatory Visit: Payer: Self-pay | Admitting: Family Medicine

## 2018-08-08 ENCOUNTER — Other Ambulatory Visit: Payer: Self-pay

## 2018-08-08 DIAGNOSIS — I10 Essential (primary) hypertension: Secondary | ICD-10-CM

## 2018-08-14 NOTE — Progress Notes (Addendum)
[facility] [address] [place], [place] [address] [phone]  Date:  08/15/2018   Name:  Katie Johnson   DOB:  [birth year]   MRN:  [id number]  PCP:  Pearline Cables, MD    Chief Complaint: Discuss Labs (discuss lab results and further instructions GFR) and Flu Vaccine   History of Present Illness:  Katie Johnson is a 82 y.o. very pleasant female patient who presents with the following:  Here today for a flu shot and to discuss labs History of arthritis, HTN Last seen here in September at which time she had reduced GFR However on last check her labs did look improved Her BP is well controlled  BP Readings from Last 3 Encounters:  08/15/18 126/80  08/02/18 136/64  07/12/18 112/60   Flu shot given today  Repeat GFR today  No Le edema    Patient Active Problem List   Diagnosis Date Noted  . AC (acromioclavicular) arthritis 12/21/2017  . Essential hypertension 11/28/2017  . Degenerative arthritis of knee, bilateral 07/15/2015  . De Quervain's disease (tenosynovitis) 09/15/2011  . METATARSALGIA 07/14/2010  . BUNIONS, BILATERAL 07/14/2010  . FOOT PAIN, BILATERAL 07/14/2010  . BACK PAIN, THORACIC REGION 03/30/2010    Past Medical History:  Diagnosis Date  . Arthritis   . Chicken pox   . GERD (gastroesophageal reflux disease)   . High blood pressure   . Hypertension     No past surgical history on file.  Social History   Tobacco Use  . Smoking status: Never Smoker  . Smokeless tobacco: Never Used  Substance Use Topics  . Alcohol use: Not on file  . Drug use: Not on file    Family History  Problem Relation Age of Onset  . COPD Mother   . Heart disease Mother   . Diabetes Father   . Heart disease Father     Allergies  Allergen Reactions  . Statins Diarrhea and Nausea And Vomiting  . Pravastatin Diarrhea  . Shellfish-Derived Products   . Ketoprofen Rash    Medication list has been  reviewed and updated.  Current Outpatient Medications on File Prior to Visit  Medication Sig Dispense Refill  . amLODipine-benazepril (LOTREL) 10-20 MG capsule Take 1 capsule by mouth daily. 90 capsule 3  . cholecalciferol (VITAMIN D) 1000 units tablet Take 2,000 Units by mouth daily.    . diclofenac sodium (VOLTAREN) 1 % GEL Apply 2 g topically 4 (four) times daily. 1 Tube 3  . Multiple Vitamins-Minerals (CENTRUM ADULTS PO) Take by mouth.     No current facility-administered medications on file prior to visit.     Review of Systems:  As per HPI- otherwise negative. Feeling well    Physical Examination: Vitals:   08/15/18 1551  BP: 126/80  Pulse: 68  Resp: 16  Temp: 97.8 F (36.6 C)  SpO2: 97%   Vitals:   08/15/18 1551  Weight: 167 lb (75.8 kg)  Height: 5\' 4"  (1.626 m)   Body mass index is 28.67 kg/m. Ideal Body Weight: Weight in (lb) to have BMI = 25: 145.3  GEN: WDWN, NAD, Non-toxic, A & O x 3, very robust and spry for age  HEENT: Atraumatic, Normocephalic. Neck supple. No masses, No LAD. Ears and Nose: No external deformity. CV: RRR, No M/G/R. No JVD. No thrill. No extra heart sounds. PULM: CTA B, no wheezes, crackles, rhonchi. No retractions. No resp. distress.  No accessory muscle use. EXTR: No c/c/e NEURO Normal gait.  PSYCH: Normally interactive. Conversant. Not depressed or anxious appearing.  Calm demeanor.    Assessment and Plan: Need for influenza vaccination - Plan: Flu vaccine HIGH DOSE PF (Fluzone High dose)  Decreased GFR - Plan: Basic metabolic panel  Essential hypertension - Plan: Basic metabolic panel  Repeat labs today- Will plan further follow- up pending labs. Flu shot given   Signed Abbe Amsterdam, MD  Received her labs 10/10 Results for orders placed or performed in visit on 08/15/18  Basic metabolic panel  Result Value Ref Range   Sodium 139 135 - 145 mEq/L   Potassium 5.0 3.5 - 5.1 mEq/L   Chloride 104 96 - 112 mEq/L   CO2 25  19 - 32 mEq/L   Glucose, Bld 92 70 - 99 mg/dL   BUN 28 (H) 6 - 23 mg/dL   Creatinine, Ser 1.61 (H) 0.40 - 1.20 mg/dL   Calcium 9.5 8.4 - 09.6 mg/dL   GFR 04.54 (L) >09.81 mL/min   Renal function is down a bit again Message to pt- monitor vs referral to nephrology

## 2018-08-15 ENCOUNTER — Encounter: Payer: Self-pay | Admitting: Family Medicine

## 2018-08-15 ENCOUNTER — Ambulatory Visit: Payer: Medicare Other | Admitting: Family Medicine

## 2018-08-15 VITALS — BP 126/80 | HR 68 | Temp 97.8°F | Resp 16 | Ht 64.0 in | Wt 167.0 lb

## 2018-08-15 DIAGNOSIS — Z23 Encounter for immunization: Secondary | ICD-10-CM | POA: Diagnosis not present

## 2018-08-15 DIAGNOSIS — I10 Essential (primary) hypertension: Secondary | ICD-10-CM

## 2018-08-15 DIAGNOSIS — R944 Abnormal results of kidney function studies: Secondary | ICD-10-CM

## 2018-08-16 ENCOUNTER — Encounter: Payer: Self-pay | Admitting: Family Medicine

## 2018-08-16 LAB — BASIC METABOLIC PANEL
BUN: 28 mg/dL — ABNORMAL HIGH (ref 6–23)
CALCIUM: 9.5 mg/dL (ref 8.4–10.5)
CO2: 25 meq/L (ref 19–32)
CREATININE: 1.31 mg/dL — AB (ref 0.40–1.20)
Chloride: 104 mEq/L (ref 96–112)
GFR: 41.5 mL/min — ABNORMAL LOW (ref >60.00)
GLUCOSE: 92 mg/dL (ref 70–99)
Potassium: 5 mEq/L (ref 3.5–5.1)
Sodium: 139 mEq/L (ref 135–145)

## 2018-08-22 ENCOUNTER — Ambulatory Visit (HOSPITAL_BASED_OUTPATIENT_CLINIC_OR_DEPARTMENT_OTHER)
Admission: RE | Admit: 2018-08-22 | Discharge: 2018-08-22 | Disposition: A | Discharge status: Home / Self Care | Payer: Medicare Other | Source: Ambulatory Visit | Attending: Family Medicine | Admitting: Family Medicine

## 2018-08-22 ENCOUNTER — Encounter: Payer: Self-pay | Admitting: Family Medicine

## 2018-08-22 DIAGNOSIS — N2889 Other specified disorders of kidney and ureter: Secondary | ICD-10-CM | POA: Diagnosis present

## 2018-08-22 DIAGNOSIS — I10 Essential (primary) hypertension: Secondary | ICD-10-CM | POA: Diagnosis not present

## 2018-08-24 ENCOUNTER — Ambulatory Visit: Payer: Medicare Other | Admitting: Family Medicine

## 2018-09-30 ENCOUNTER — Inpatient Hospital Stay (HOSPITAL_COMMUNITY): Payer: Medicare Other

## 2018-09-30 ENCOUNTER — Emergency Department (HOSPITAL_BASED_OUTPATIENT_CLINIC_OR_DEPARTMENT_OTHER): Payer: Medicare Other

## 2018-09-30 ENCOUNTER — Encounter (HOSPITAL_BASED_OUTPATIENT_CLINIC_OR_DEPARTMENT_OTHER): Payer: Self-pay | Admitting: Emergency Medicine

## 2018-09-30 ENCOUNTER — Other Ambulatory Visit: Payer: Self-pay

## 2018-09-30 ENCOUNTER — Inpatient Hospital Stay (HOSPITAL_BASED_OUTPATIENT_CLINIC_OR_DEPARTMENT_OTHER)
Admission: EM | Admit: 2018-09-30 | Discharge: 2018-10-06 | DRG: 871 | Disposition: A | Discharge status: Home Health | Payer: Medicare Other | Attending: Internal Medicine | Admitting: Internal Medicine

## 2018-09-30 DIAGNOSIS — N183 Chronic kidney disease, stage 3 unspecified: Secondary | ICD-10-CM

## 2018-09-30 DIAGNOSIS — I129 Hypertensive chronic kidney disease with stage 1 through stage 4 chronic kidney disease, or unspecified chronic kidney disease: Secondary | ICD-10-CM | POA: Diagnosis present

## 2018-09-30 DIAGNOSIS — Z8619 Personal history of other infectious and parasitic diseases: Secondary | ICD-10-CM | POA: Diagnosis not present

## 2018-09-30 DIAGNOSIS — E871 Hypo-osmolality and hyponatremia: Secondary | ICD-10-CM | POA: Diagnosis not present

## 2018-09-30 DIAGNOSIS — R0602 Shortness of breath: Secondary | ICD-10-CM

## 2018-09-30 DIAGNOSIS — M1990 Unspecified osteoarthritis, unspecified site: Secondary | ICD-10-CM | POA: Diagnosis present

## 2018-09-30 DIAGNOSIS — E8770 Fluid overload, unspecified: Secondary | ICD-10-CM | POA: Diagnosis not present

## 2018-09-30 DIAGNOSIS — D539 Nutritional anemia, unspecified: Secondary | ICD-10-CM | POA: Diagnosis not present

## 2018-09-30 DIAGNOSIS — I10 Essential (primary) hypertension: Secondary | ICD-10-CM | POA: Diagnosis present

## 2018-09-30 DIAGNOSIS — Z79899 Other long term (current) drug therapy: Secondary | ICD-10-CM | POA: Diagnosis not present

## 2018-09-30 DIAGNOSIS — J811 Chronic pulmonary edema: Secondary | ICD-10-CM | POA: Diagnosis present

## 2018-09-30 DIAGNOSIS — K219 Gastro-esophageal reflux disease without esophagitis: Secondary | ICD-10-CM | POA: Diagnosis present

## 2018-09-30 DIAGNOSIS — E876 Hypokalemia: Secondary | ICD-10-CM | POA: Diagnosis present

## 2018-09-30 DIAGNOSIS — Z20828 Contact with and (suspected) exposure to other viral communicable diseases: Secondary | ICD-10-CM | POA: Diagnosis present

## 2018-09-30 DIAGNOSIS — Z888 Allergy status to other drugs, medicaments and biological substances status: Secondary | ICD-10-CM | POA: Diagnosis not present

## 2018-09-30 DIAGNOSIS — J181 Lobar pneumonia, unspecified organism: Secondary | ICD-10-CM

## 2018-09-30 DIAGNOSIS — Z91013 Allergy to seafood: Secondary | ICD-10-CM

## 2018-09-30 DIAGNOSIS — J9601 Acute respiratory failure with hypoxia: Secondary | ICD-10-CM

## 2018-09-30 DIAGNOSIS — A419 Sepsis, unspecified organism: Principal | ICD-10-CM | POA: Diagnosis present

## 2018-09-30 DIAGNOSIS — H9190 Unspecified hearing loss, unspecified ear: Secondary | ICD-10-CM | POA: Diagnosis present

## 2018-09-30 DIAGNOSIS — D631 Anemia in chronic kidney disease: Secondary | ICD-10-CM | POA: Diagnosis present

## 2018-09-30 DIAGNOSIS — R945 Abnormal results of liver function studies: Secondary | ICD-10-CM | POA: Diagnosis present

## 2018-09-30 DIAGNOSIS — J189 Pneumonia, unspecified organism: Secondary | ICD-10-CM | POA: Diagnosis present

## 2018-09-30 DIAGNOSIS — Z886 Allergy status to analgesic agent status: Secondary | ICD-10-CM

## 2018-09-30 DIAGNOSIS — Z8249 Family history of ischemic heart disease and other diseases of the circulatory system: Secondary | ICD-10-CM | POA: Diagnosis not present

## 2018-09-30 DIAGNOSIS — R531 Weakness: Secondary | ICD-10-CM | POA: Diagnosis not present

## 2018-09-30 DIAGNOSIS — E785 Hyperlipidemia, unspecified: Secondary | ICD-10-CM | POA: Diagnosis present

## 2018-09-30 DIAGNOSIS — R7989 Other specified abnormal findings of blood chemistry: Secondary | ICD-10-CM

## 2018-09-30 DIAGNOSIS — R05 Cough: Secondary | ICD-10-CM | POA: Diagnosis not present

## 2018-09-30 LAB — URINALYSIS, ROUTINE W REFLEX MICROSCOPIC
Bilirubin Urine: NEGATIVE
Glucose, UA: NEGATIVE mg/dL
Hgb urine dipstick: NEGATIVE
Ketones, ur: NEGATIVE mg/dL
Leukocytes, UA: NEGATIVE
Nitrite: NEGATIVE
PROTEIN: 30 mg/dL — AB
Specific Gravity, Urine: >1.03 — ABNORMAL HIGH (ref 1.005–1.030)
pH: 5.5 (ref 5.0–8.0)

## 2018-09-30 LAB — CBC WITH DIFFERENTIAL/PLATELET
ABS IMMATURE GRANULOCYTES: 0.07 10*3/uL (ref 0.00–0.07)
BASOS PCT: 0 %
Basophils Absolute: 0 10*3/uL (ref 0.0–0.1)
Eosinophils Absolute: 0.3 10*3/uL (ref 0.0–0.5)
Eosinophils Relative: 2 %
HCT: 41 % (ref 36.0–46.0)
Hemoglobin: 13.2 g/dL (ref 12.0–15.0)
Immature Granulocytes: 1 %
LYMPHS PCT: 10 %
Lymphs Abs: 1.1 10*3/uL (ref 0.7–4.0)
MCH: 32.4 pg (ref 26.0–34.0)
MCHC: 32.2 g/dL (ref 30.0–36.0)
MCV: 100.7 fL — ABNORMAL HIGH (ref 80.0–100.0)
MONOS PCT: 12 %
Monocytes Absolute: 1.4 10*3/uL — ABNORMAL HIGH (ref 0.1–1.0)
NEUTROS ABS: 9 10*3/uL — AB (ref 1.7–7.7)
Neutrophils Relative %: 75 %
PLATELETS: 195 10*3/uL (ref 150–400)
RBC: 4.07 MIL/uL (ref 3.87–5.11)
RDW: 12.6 % (ref 11.5–15.5)
WBC: 11.9 10*3/uL — ABNORMAL HIGH (ref 4.0–10.5)
nRBC: 0 % (ref 0.0–0.2)

## 2018-09-30 LAB — URINALYSIS, MICROSCOPIC (REFLEX): RBC / HPF: NONE SEEN RBC/hpf (ref 0–5)

## 2018-09-30 LAB — COMPREHENSIVE METABOLIC PANEL
ALBUMIN: 4.1 g/dL (ref 3.5–5.0)
ALT: 18 U/L (ref 0–44)
ANION GAP: 13 (ref 5–15)
AST: 21 U/L (ref 15–41)
Alkaline Phosphatase: 61 U/L (ref 38–126)
BILIRUBIN TOTAL: 0.8 mg/dL (ref 0.3–1.2)
BUN: 17 mg/dL (ref 8–23)
CHLORIDE: 96 mmol/L — AB (ref 98–111)
CO2: 23 mmol/L (ref 22–32)
Calcium: 9 mg/dL (ref 8.9–10.3)
Creatinine, Ser: 1.14 mg/dL — ABNORMAL HIGH (ref 0.44–1.00)
GFR calc Af Amer: 51 mL/min — ABNORMAL LOW (ref >60)
GFR calc non Af Amer: 44 mL/min — ABNORMAL LOW (ref >60)
GLUCOSE: 153 mg/dL — AB (ref 70–99)
POTASSIUM: 3.7 mmol/L (ref 3.5–5.1)
Sodium: 132 mmol/L — ABNORMAL LOW (ref 135–145)
TOTAL PROTEIN: 8.4 g/dL — AB (ref 6.5–8.1)

## 2018-09-30 LAB — PROCALCITONIN: Procalcitonin: <0.1 ng/mL

## 2018-09-30 LAB — INFLUENZA PANEL BY PCR (TYPE A & B)
INFLAPCR: NEGATIVE
INFLBPCR: NEGATIVE

## 2018-09-30 LAB — I-STAT CG4 LACTIC ACID, ED: Lactic Acid, Venous: 1.58 mmol/L (ref 0.5–1.9)

## 2018-09-30 MED ORDER — SODIUM CHLORIDE 0.9 % IV SOLN
500.0000 mg | INTRAVENOUS | Status: DC
  Administered 2018-10-01: 500 mg via INTRAVENOUS
  Filled 2018-09-30 (×2): qty 500

## 2018-09-30 MED ORDER — ENOXAPARIN SODIUM 40 MG/0.4ML ~~LOC~~ SOLN
40.0000 mg | SUBCUTANEOUS | Status: DC
  Administered 2018-09-30 – 2018-10-05 (×6): 40 mg via SUBCUTANEOUS
  Filled 2018-09-30 (×6): qty 0.4

## 2018-09-30 MED ORDER — SODIUM CHLORIDE 0.9 % IV SOLN
1.0000 g | Freq: Once | INTRAVENOUS | Status: AC
  Administered 2018-09-30: 1 g via INTRAVENOUS
  Filled 2018-09-30: qty 10

## 2018-09-30 MED ORDER — GUAIFENESIN ER 600 MG PO TB12
1200.0000 mg | ORAL_TABLET | Freq: Two times a day (BID) | ORAL | Status: DC
  Administered 2018-09-30 – 2018-10-06 (×12): 1200 mg via ORAL
  Filled 2018-09-30 (×13): qty 2

## 2018-09-30 MED ORDER — SODIUM CHLORIDE 0.9 % IV BOLUS
1000.0000 mL | Freq: Once | INTRAVENOUS | Status: AC
  Administered 2018-09-30: 1000 mL via INTRAVENOUS

## 2018-09-30 MED ORDER — ACETAMINOPHEN 500 MG PO TABS
1000.0000 mg | ORAL_TABLET | Freq: Once | ORAL | Status: AC
  Administered 2018-09-30: 1000 mg via ORAL
  Filled 2018-09-30: qty 2

## 2018-09-30 MED ORDER — IPRATROPIUM-ALBUTEROL 0.5-2.5 (3) MG/3ML IN SOLN
3.0000 mL | Freq: Three times a day (TID) | RESPIRATORY_TRACT | Status: DC
  Administered 2018-09-30 – 2018-10-05 (×16): 3 mL via RESPIRATORY_TRACT
  Filled 2018-09-30 (×17): qty 3

## 2018-09-30 MED ORDER — SODIUM CHLORIDE 0.9 % IV SOLN
1.0000 g | INTRAVENOUS | Status: DC
  Administered 2018-10-01: 1 g via INTRAVENOUS
  Filled 2018-09-30: qty 1
  Filled 2018-09-30: qty 10

## 2018-09-30 MED ORDER — AMLODIPINE BESYLATE 5 MG PO TABS
5.0000 mg | ORAL_TABLET | Freq: Every day | ORAL | Status: DC
  Administered 2018-09-30 – 2018-10-06 (×7): 5 mg via ORAL
  Filled 2018-09-30 (×7): qty 1

## 2018-09-30 MED ORDER — SODIUM CHLORIDE 0.9 % IV SOLN
INTRAVENOUS | Status: DC
  Administered 2018-09-30 – 2018-10-03 (×6): via INTRAVENOUS

## 2018-09-30 MED ORDER — IPRATROPIUM-ALBUTEROL 0.5-2.5 (3) MG/3ML IN SOLN
3.0000 mL | Freq: Four times a day (QID) | RESPIRATORY_TRACT | Status: DC
  Administered 2018-09-30: 3 mL via RESPIRATORY_TRACT
  Filled 2018-09-30: qty 3

## 2018-09-30 MED ORDER — SODIUM CHLORIDE 0.9 % IV BOLUS
500.0000 mL | Freq: Once | INTRAVENOUS | Status: AC
  Administered 2018-09-30: 500 mL via INTRAVENOUS

## 2018-09-30 MED ORDER — IPRATROPIUM-ALBUTEROL 0.5-2.5 (3) MG/3ML IN SOLN
3.0000 mL | RESPIRATORY_TRACT | Status: DC | PRN
  Administered 2018-10-02: 3 mL via RESPIRATORY_TRACT
  Filled 2018-09-30: qty 3

## 2018-09-30 MED ORDER — SODIUM CHLORIDE 0.9 % IV SOLN
500.0000 mg | Freq: Once | INTRAVENOUS | Status: AC
  Administered 2018-09-30: 500 mg via INTRAVENOUS
  Filled 2018-09-30: qty 500

## 2018-09-30 MED ORDER — ONDANSETRON HCL 4 MG/2ML IJ SOLN: 4.0000 mg | Freq: Four times a day (QID) | INTRAMUSCULAR | Status: DC | PRN

## 2018-09-30 MED ORDER — VITAMIN D3 25 MCG (1000 UNIT) PO TABS
2000.0000 [IU] | ORAL_TABLET | Freq: Every day | ORAL | Status: DC
  Administered 2018-09-30 – 2018-10-06 (×7): 2000 [IU] via ORAL
  Filled 2018-09-30 (×7): qty 2

## 2018-09-30 MED ORDER — AZITHROMYCIN 500 MG IV SOLR
INTRAVENOUS | Status: AC
  Filled 2018-09-30: qty 500

## 2018-09-30 MED ORDER — ACETAMINOPHEN 325 MG PO TABS
650.0000 mg | ORAL_TABLET | Freq: Four times a day (QID) | ORAL | Status: DC | PRN
  Administered 2018-09-30 – 2018-10-01 (×5): 650 mg via ORAL
  Filled 2018-09-30 (×5): qty 2

## 2018-09-30 NOTE — Progress Notes (Signed)
Pt has arrived to Room 1519 from [facility]. Alert, oriented, and without c/o.

## 2018-09-30 NOTE — ED Triage Notes (Addendum)
Cough, fever, body aches, generalized weakness for " a couple days". No meds given this morning. Pt fell this morning due to weakness. Pt returned from [place] 1 week ago.

## 2018-09-30 NOTE — Progress Notes (Signed)
   09/30/18 1732  MEWS Assessment  Is this an acute change? No  Pt has been running a fever since being seen at [facility]. Tylenol has been controlling fever. Tylenol given for this temperature. Will reassess in 40 minutes.

## 2018-09-30 NOTE — ED Notes (Signed)
ED Provider at bedside. 

## 2018-09-30 NOTE — Progress Notes (Signed)
82 year old female with past medical history significant for hypertension presented to [facility] ED with complaints of fever and productive cough of a couple of days duration recently came back from [place].  On chest x-ray right lower lobe infiltrates suggestive of pneumonia.  T-max 102.8.  Respiration rate 28.  Influenza panel pending.  TRH asked to admit for community-acquired pneumonia.  We will admit to MedSurg unit as inpatient status.  Preliminary diagnosis sepsis secondary to community-acquired pneumonia.

## 2018-09-30 NOTE — ED Notes (Signed)
Pt unable to void at this time. Aware of the need for a urine sample.

## 2018-09-30 NOTE — ED Provider Notes (Signed)
[facility] EMERGENCY DEPARTMENT Provider Note   CSN: [id number] Arrival date & time: 09/30/18  [id number]     History   Chief Complaint Chief Complaint  Patient presents with  . Fever    HPI Katie Johnson is a 82 y.o. female.  HPI   1 week ago came back from [place], last Monday had shingles shot, then felt like had the flu, started that 3 days ago, couldn't eat, waited several days but not getting better, kept getting worse.  Generalized weakness, headache, feeling off balance, cough, congestion.  No dyspnea that she is aware of.  No sore throat. Body aches.  Friend on the trip with her had the flu.    Past Medical History:  Diagnosis Date  . Arthritis   . Chicken pox   . GERD (gastroesophageal reflux disease)   . High blood pressure   . Hypertension     Patient Active Problem List   Diagnosis Date Noted  . Sepsis (HCC) 09/30/2018  . AC (acromioclavicular) arthritis 12/21/2017  . Essential hypertension 11/28/2017  . Degenerative arthritis of knee, bilateral 07/15/2015  . De Quervain's disease (tenosynovitis) 09/15/2011  . METATARSALGIA 07/14/2010  . BUNIONS, BILATERAL 07/14/2010  . FOOT PAIN, BILATERAL 07/14/2010  . BACK PAIN, THORACIC REGION 03/30/2010    History reviewed. No pertinent surgical history.   OB History   None      Home Medications    Prior to Admission medications   Medication Sig Start Date End Date Taking? Authorizing Provider  amLODipine-benazepril (LOTREL) 10-20 MG capsule Take 1 capsule by mouth daily. 11/29/17  Yes Copland, Gwenlyn FoundJessica C, MD  cholecalciferol (VITAMIN D) 1000 units tablet Take 2,000 Units by mouth daily.   Yes [provider]  diclofenac sodium (VOLTAREN) 1 % GEL Apply 2 g topically 4 (four) times daily. 01/16/18  Yes Judi SaaSmith, Zachary M, DO  Multiple Vitamins-Minerals (CENTRUM ADULTS PO) Take by mouth.   Yes [provider]    Family History Family History  Problem Relation Age of Onset  . COPD Mother    . Heart disease Mother   . Diabetes Father   . Heart disease Father     Social History Social History   Tobacco Use  . Smoking status: Never Smoker  . Smokeless tobacco: Never Used  Substance Use Topics  . Alcohol use: Not on file  . Drug use: Not on file     Allergies   Shellfish-derived products; Statins; Pravastatin; and Ketoprofen   Review of Systems Review of Systems  Constitutional: Positive for fatigue and fever.  HENT: Positive for congestion. Negative for sore throat.   Eyes: Negative for visual disturbance.  Respiratory: Positive for cough. Negative for shortness of breath.   Cardiovascular: Negative for chest pain.  Gastrointestinal: Negative for abdominal pain, nausea and vomiting.  Genitourinary: Negative for difficulty urinating and dysuria.  Musculoskeletal: Positive for myalgias. Negative for back pain.  Skin: Negative for rash.  Neurological: Positive for headaches. Negative for syncope. Weakness: generalized.     Physical Exam Updated Vital Signs BP (!) 154/65   Pulse 89   Temp (!) 101.7 F (38.7 C) (Oral) Comment: MD paged for fever reducer  Resp 20   Ht 5\' 6"  (1.676 m)   Wt 73 kg   SpO2 93%   BMI 25.99 kg/m   Physical Exam  Constitutional: She is oriented to person, place, and time. She appears well-developed and well-nourished. No distress.  HENT:  Head: Normocephalic and atraumatic.  Eyes:  Conjunctivae and EOM are normal.  Neck: Normal range of motion.  Cardiovascular: Normal rate, regular rhythm, normal heart sounds and intact distal pulses. Exam reveals no gallop and no friction rub.  No murmur heard. Pulmonary/Chest: Effort normal. No respiratory distress. She has no wheezes. She has rhonchi (right low).  Abdominal: Soft. She exhibits no distension. There is no tenderness. There is no guarding.  Musculoskeletal: She exhibits no edema or tenderness.  Neurological: She is alert and oriented to person, place, and time.  Skin: Skin  is warm and dry. No rash noted. She is not diaphoretic. No erythema.  Nursing note and vitals reviewed.    ED Treatments / Results  Labs (all labs ordered are listed, but only abnormal results are displayed) Labs Reviewed  COMPREHENSIVE METABOLIC PANEL - Abnormal; Notable for the following components:      Result Value   Sodium 132 (*)    Chloride 96 (*)    Glucose, Bld 153 (*)    Creatinine, Ser 1.14 (*)    Total Protein 8.4 (*)    GFR calc non Af Amer 44 (*)    GFR calc Af Amer 51 (*)    All other components within normal limits  CBC WITH DIFFERENTIAL/PLATELET - Abnormal; Notable for the following components:   WBC 11.9 (*)    MCV 100.7 (*)    Neutro Abs 9.0 (*)    Monocytes Absolute 1.4 (*)    All other components within normal limits  URINALYSIS, ROUTINE W REFLEX MICROSCOPIC - Abnormal; Notable for the following components:   Specific Gravity, Urine >1.030 (*)    Protein, ur 30 (*)    All other components within normal limits  URINALYSIS, MICROSCOPIC (REFLEX) - Abnormal; Notable for the following components:   Bacteria, UA MANY (*)    All other components within normal limits  CULTURE, BLOOD (ROUTINE X 2)  CULTURE, BLOOD (ROUTINE X 2)  INFLUENZA PANEL BY PCR (TYPE A & B)  PROCALCITONIN  BASIC METABOLIC PANEL  CBC  I-STAT CG4 LACTIC ACID, ED  I-STAT CG4 LACTIC ACID, ED    EKG None  Radiology Dg Chest 2 View  Result Date: 09/30/2018 CLINICAL DATA:  Cough, fever, body aches, generalized weakness for 3 days, return from [place] 1 week ago, history hypertension, GERD EXAM: CHEST - 2 VIEW COMPARISON:  04/08/2013 FINDINGS: Normal heart size, mediastinal contours, and pulmonary vascularity. Opacity identified in the posterior RIGHT mid lung, probably representing pneumonia though requiring followup until resolution to exclude underlying mass. Remaining lungs clear. No pleural effusion or pneumothorax. Diffuse osseous demineralization. IMPRESSION: RIGHT mid lung opacity  posteriorly likely representing pneumonia. However, underlying mass/tumor is not excluded and radiographic follow-up until resolution is required to exclude underlying tumor. Electronically Signed   By: Ulyses Southward M.D.   On: 09/30/2018 10:26    Procedures Procedures (including critical care time)  Medications Ordered in ED Medications  azithromycin (ZITHROMAX) 500 MG injection (has no administration in time range)  azithromycin (ZITHROMAX) 500 mg in sodium chloride 0.9 % 250 mL IVPB (has no administration in time range)  cefTRIAXone (ROCEPHIN) 1 g in sodium chloride 0.9 % 100 mL IVPB (has no administration in time range)  0.9 %  sodium chloride infusion ( Intravenous New Bag/Given 09/30/18 2057)  ipratropium-albuterol (DUONEB) 0.5-2.5 (3) MG/3ML nebulizer solution 3 mL (has no administration in time range)  acetaminophen (TYLENOL) tablet 650 mg (650 mg Oral Given 09/30/18 1621)  ondansetron (ZOFRAN) injection 4 mg (has no administration in time range)  guaiFENesin (MUCINEX) 12 hr tablet 1,200 mg (0 mg Oral Duplicate 09/30/18 1600)  ipratropium-albuterol (DUONEB) 0.5-2.5 (3) MG/3ML nebulizer solution 3 mL (3 mLs Nebulization Given 09/30/18 1908)  enoxaparin (LOVENOX) injection 40 mg (40 mg Subcutaneous Given 09/30/18 2052)  cholecalciferol (VITAMIN D) tablet 2,000 Units (2,000 Units Oral Given 09/30/18 2052)  amLODipine (NORVASC) tablet 5 mg (5 mg Oral Given 09/30/18 2053)  sodium chloride 0.9 % bolus 500 mL (has no administration in time range)  acetaminophen (TYLENOL) tablet 1,000 mg (1,000 mg Oral Given 09/30/18 0941)  cefTRIAXone (ROCEPHIN) 1 g in sodium chloride 0.9 % 100 mL IVPB (1 g Intravenous New Bag/Given 09/30/18 1139)  azithromycin (ZITHROMAX) 500 mg in sodium chloride 0.9 % 250 mL IVPB (0 mg Intravenous Stopped 09/30/18 1137)  sodium chloride 0.9 % bolus 1,000 mL (0 mLs Intravenous Stopped 09/30/18 1137)     Initial Impression / Assessment and Plan / ED Course  I have  reviewed the triage vital signs and the nursing notes.  Pertinent labs & imaging results that were available during my care of the patient were reviewed by me and considered in my medical decision making (see chart for details).     82yo female with history above presents with concern for cough, congestion, fever, body aches.  Chest XR and history consistent with pneumonia.  O2 saturation down to 88% on room air.  Vital signs otherwise stable with exception of fever. Lactate WNL.  Admitted for further care.  Final Clinical Impressions(s) / ED Diagnoses   Final diagnoses:  Community acquired pneumonia of right middle lobe of lung [facility])    ED Discharge Orders    None       Alvira Monday, MD 09/30/18 2109

## 2018-09-30 NOTE — H&P (Signed)
History and Physical  Katie Buttonnn B Furr RUE:[id number] DOB: 04/03/1936 DOA: 09/30/2018  Referring physician: Dr Dalene SeltzerSchlossman PCP: Patsy Lageropland, Gwenlyn FoundJessica C, MD  Outpatient Specialists: None Patient coming from: Home  Chief Complaint: Persistent cough with poor appetite  HPI: Katie Johnson is a 82 y.o. female with medical history significant for hypertension, hyperlipidemia, hearing loss, who presented to [facility] ED with complaints of worsening productive cough and poor appetite of 3-days duration.  Recently returned from a trip to [place] 1 week ago.  Denies chest pain or dyspnea at rest.  Admits to close contact exposure to flu.  Upon presentation to the ED patient is hypoxic and febrile with T-max of 102.8, tachypneic with respiration rate 28.  Chest x-ray revealed right middle lobe infiltrate with suspicion for community-acquired pneumonia.  TRH asked to admit.  ED Course: Lab studies remarkable for mildly elevated white count 11.9K.  Treated empirically for community-acquired pneumonia with IV azithromycin and IV Rocephin.  [facility] treatment for community-acquired pneumonia.  Review of Systems: Review of systems as noted in the HPI. All other systems reviewed and are negative.   Past Medical History:  Diagnosis Date  . Arthritis   . Chicken pox   . GERD (gastroesophageal reflux disease)   . High blood pressure   . Hypertension    History reviewed. No pertinent surgical history.  Social History:  reports that she has never smoked. She has never used smokeless tobacco. Her alcohol and drug histories are not on file.   Allergies  Allergen Reactions  . Statins Diarrhea and Nausea And Vomiting  . Pravastatin Diarrhea  . Shellfish-Derived Products   . Ketoprofen Rash    Family History  Problem Relation Age of Onset  . COPD Mother   . Heart disease Mother   . Diabetes Father   . Heart disease Father      Prior to Admission medications   Medication Sig Start Date End Date  Taking? Authorizing Provider  amLODipine-benazepril (LOTREL) 10-20 MG capsule Take 1 capsule by mouth daily. 11/29/17   Copland, Gwenlyn FoundJessica C, MD  cholecalciferol (VITAMIN D) 1000 units tablet Take 2,000 Units by mouth daily.    [provider]  diclofenac sodium (VOLTAREN) 1 % GEL Apply 2 g topically 4 (four) times daily. 01/16/18   Judi SaaSmith, Zachary M, DO  Multiple Vitamins-Minerals (CENTRUM ADULTS PO) Take by mouth.    [provider]    Physical Exam: BP 117/67 (BP Location: Left Arm)   Pulse 75   Temp 98.9 F (37.2 C) (Oral)   Resp 20   Ht 5\' 6"  (1.676 m)   Wt 73 kg   SpO2 98%   BMI 25.99 kg/m   . General: 82 y.o. year-old female well developed well nourished in no acute distress.  Alert and oriented x3.  Hard of hearing. . Cardiovascular: Regular rate and rhythm with no rubs or gallops.  No thyromegaly or JVD noted.  No lower extremity edema. 2/4 pulses in all 4 extremities. Marland Kitchen. Respiratory: Rales at bases bilaterally with no wheezes noted. Good inspiratory effort. . Abdomen: Soft nontender nondistended with normal bowel sounds x4 quadrants. . Muskuloskeletal: No cyanosis, clubbing or edema noted bilaterally . Neuro: CN II-XII intact, strength, sensation, reflexes . Skin: No ulcerative lesions noted or rashes . Psychiatry: Judgement and insight appear normal. Mood is appropriate for condition and setting          Labs on Admission:  Basic Metabolic Panel: Recent Labs  Lab 09/30/18 0941  NA 132*  K 3.7  CL 96*  CO2 23  GLUCOSE 153*  BUN 17  CREATININE 1.14*  CALCIUM 9.0   Liver Function Tests: Recent Labs  Lab 09/30/18 0941  AST 21  ALT 18  ALKPHOS 61  BILITOT 0.8  PROT 8.4*  ALBUMIN 4.1   No results for input(s): LIPASE, AMYLASE in the last 168 hours. No results for input(s): AMMONIA in the last 168 hours. CBC: Recent Labs  Lab 09/30/18 0941  WBC 11.9*  NEUTROABS 9.0*  HGB 13.2  HCT 41.0  MCV 100.7*  PLT 195   Cardiac Enzymes: No  results for input(s): CKTOTAL, CKMB, CKMBINDEX, TROPONINI in the last 168 hours.  BNP (last 3 results) No results for input(s): BNP in the last 8760 hours.  ProBNP (last 3 results) No results for input(s): PROBNP in the last 8760 hours.  CBG: No results for input(s): GLUCAP in the last 168 hours.  Radiological Exams on Admission: Dg Chest 2 View  Result Date: 09/30/2018 CLINICAL DATA:  Cough, fever, body aches, generalized weakness for 3 days, return from [place] 1 week ago, history hypertension, GERD EXAM: CHEST - 2 VIEW COMPARISON:  04/08/2013 FINDINGS: Normal heart size, mediastinal contours, and pulmonary vascularity. Opacity identified in the posterior RIGHT mid lung, probably representing pneumonia though requiring followup until resolution to exclude underlying mass. Remaining lungs clear. No pleural effusion or pneumothorax. Diffuse osseous demineralization. IMPRESSION: RIGHT mid lung opacity posteriorly likely representing pneumonia. However, underlying mass/tumor is not excluded and radiographic follow-up until resolution is required to exclude underlying tumor. Electronically Signed   By: Ulyses Southward M.D.   On: 09/30/2018 10:26    EKG: I independently viewed the EKG done and my findings are as followed: No EKG available at the time of this evaluation.  Assessment/Plan Present on Admission: . Sepsis (HCC)  Active Problems:   Sepsis (HCC)  Sepsis secondary to presumed community-acquired pneumonia T-max 102.8, tachypneic with respiratory rate 28 Independently reviewed chest x-ray which revealed infiltrates versus atelectasis versus mass in right middle lobe Obtain CT chest to further assess Continue IV azithromycin and IV Rocephin empirically Obtain procalcitonin and influenza PCR Monitor fever curve and WBC Obtain CBC in the morning Start duo nebs every 6 hours and every 2 hours PRN for wheezing or shortness of breath Start pulmonary toilet with Mucinex 1200 mg twice daily  and hypersaline nebs 3 times daily  Acute hypoxic respiratory failure suspect secondary to presumed community-acquired pneumonia versus others Maintain O2 saturation greater than 92% Obtain CT chest to assess right middle lobe infiltrate versus mass  Hypertension Blood pressure is stable Resume home antihypertensive medications Monitor vital signs Start IV hydralazine PRN for systolic blood pressure greater than 180 or diastolic greater than 105  Euvolemic hyponatremia Sodium 132 on presentation Gentle IV fluid hydration with normal saline at 75 cc/h Repeat BMP in the morning Monitor urine output  CKD 3 Appears to be at her baseline creatinine of 1.14 and GFR 44 Avoid nephrotoxic agents/dehydration/hypotension Start IV fluid hydration at 75 cc/h of normal saline Monitor urine output Repeat BMP in the morning  Macrocytic anemia Hemoglobin stable at 13.2 with MCV of 100.7 Obtain folate and vitamin B12 levels  Patient will require at least 2 midnights for further evaluation and treatment of present condition.  Patient has a right middle lobe infiltrate versus mass that will need to be reassessed with CT scan.  She has acute hypoxic respiratory failure requiring O2 supplementation, other comorbidities and advanced age.  DVT prophylaxis: Subcu Lovenox daily  Code Status: Full code  Family Communication: None at bedside  Disposition Plan: Admit to MedSurg  Consults called: None  Admission status: Inpatient status    Darlin Drop MD [facility] Pager [phone]  If 7PM-7AM, please contact night-coverage [email or web address] Password [email or web address]  09/30/2018, 3:15 PM

## 2018-10-01 DIAGNOSIS — N183 Chronic kidney disease, stage 3 (moderate): Secondary | ICD-10-CM

## 2018-10-01 DIAGNOSIS — I10 Essential (primary) hypertension: Secondary | ICD-10-CM

## 2018-10-01 LAB — RESPIRATORY PANEL BY PCR
Adenovirus: NOT DETECTED
BORDETELLA PERTUSSIS-RVPCR: NOT DETECTED
CHLAMYDOPHILA PNEUMONIAE-RVPPCR: NOT DETECTED
CORONAVIRUS 229E-RVPPCR: NOT DETECTED
Coronavirus HKU1: NOT DETECTED
Coronavirus NL63: NOT DETECTED
Coronavirus OC43: NOT DETECTED
INFLUENZA B-RVPPCR: NOT DETECTED
Influenza A: NOT DETECTED
METAPNEUMOVIRUS-RVPPCR: NOT DETECTED
MYCOPLASMA PNEUMONIAE-RVPPCR: NOT DETECTED
PARAINFLUENZA VIRUS 2-RVPPCR: NOT DETECTED
Parainfluenza Virus 1: NOT DETECTED
Parainfluenza Virus 3: NOT DETECTED
Parainfluenza Virus 4: NOT DETECTED
RESPIRATORY SYNCYTIAL VIRUS-RVPPCR: NOT DETECTED
Rhinovirus / Enterovirus: NOT DETECTED

## 2018-10-01 LAB — BASIC METABOLIC PANEL
Anion gap: 9 (ref 5–15)
BUN: 15 mg/dL (ref 8–23)
CALCIUM: 8.3 mg/dL — AB (ref 8.9–10.3)
CO2: 22 mmol/L (ref 22–32)
CREATININE: 1 mg/dL (ref 0.44–1.00)
Chloride: 106 mmol/L (ref 98–111)
GFR calc Af Amer: >60 mL/min (ref >60)
GFR, EST NON AFRICAN AMERICAN: 52 mL/min — AB (ref >60)
GLUCOSE: 135 mg/dL — AB (ref 70–99)
Potassium: 4.5 mmol/L (ref 3.5–5.1)
Sodium: 137 mmol/L (ref 135–145)

## 2018-10-01 LAB — CBC
HCT: 40.5 % (ref 36.0–46.0)
Hemoglobin: 12.6 g/dL (ref 12.0–15.0)
MCH: 33.1 pg (ref 26.0–34.0)
MCHC: 31.1 g/dL (ref 30.0–36.0)
MCV: 106.3 fL — AB (ref 80.0–100.0)
PLATELETS: 152 10*3/uL (ref 150–400)
RBC: 3.81 MIL/uL — ABNORMAL LOW (ref 3.87–5.11)
RDW: 13.1 % (ref 11.5–15.5)
WBC: 12.1 10*3/uL — ABNORMAL HIGH (ref 4.0–10.5)
nRBC: 0 % (ref 0.0–0.2)

## 2018-10-01 NOTE — Progress Notes (Signed)
PROGRESS NOTE    Katie Johnson  WUJ:[id number] DOB: 02-Sep-1936 DOA: 09/30/2018 PCP: Pearline Cables, MD   Brief Narrative:  HPI on 09/30/2018 by Dr. Dow Adolph Katie Johnson is a 82 y.o. female with medical history significant for hypertension, hyperlipidemia, hearing loss, who presented to [facility] ED with complaints of worsening productive cough and poor appetite of 3-days duration.  Recently returned from a trip to [place] 1 week ago.  Denies chest pain or dyspnea at rest.  Admits to close contact exposure to flu.  Upon presentation to the ED patient is hypoxic and febrile with T-max of 102.8, tachypneic with respiration rate 28.  Chest x-ray revealed right middle lobe infiltrate with suspicion for community-acquired pneumonia.  TRH asked to admit. Assessment & Plan   Sepsis secondary to community-acquired pneumonia -Patient presented with tachypnea, fever -Chest x-ray reviewed which noted infiltrates in the right middle lobe -CT chest -Continue azithromycin, Rocephin -Influenza PCR negative, will obtain respiratory viral panel given continued high spikes of fever -Continue nebulizer treatments, Mucinex -Will order flutter valve  Essential hypertension -continue amlodipine  Hyponatremia -Resolved with IVF -Sodium currently 137 (was 132 on admission)  Chronic kidney disease, stage III -Creatinine stable, continue to monitor BMP  DVT Prophylaxis  lovenox  Code Status: Full  Family Communication: None at bedside  Disposition Plan: Admitted. Suspect home when medically stable. Continues to spike fevers >100F.   Consultants None  Procedures  None  Antibiotics   Anti-infectives (From admission, onward)   Start     Dose/Rate Route Frequency Ordered Stop   10/01/18 1200  azithromycin (ZITHROMAX) 500 mg in sodium chloride 0.9 % 250 mL IVPB     500 mg 250 mL/hr over 60 Minutes Intravenous Every 24 hours 09/30/18 1319     10/01/18 1100  cefTRIAXone (ROCEPHIN) 1 g in sodium  chloride 0.9 % 100 mL IVPB     1 g 200 mL/hr over 30 Minutes Intravenous Every 24 hours 09/30/18 1319     09/30/18 1015  cefTRIAXone (ROCEPHIN) 1 g in sodium chloride 0.9 % 100 mL IVPB     1 g 200 mL/hr over 30 Minutes Intravenous  Once 09/30/18 1012 09/30/18 1209   09/30/18 1015  azithromycin (ZITHROMAX) 500 mg in sodium chloride 0.9 % 250 mL IVPB     500 mg 250 mL/hr over 60 Minutes Intravenous  Once 09/30/18 1012 09/30/18 1137   09/30/18 1014  azithromycin (ZITHROMAX) 500 MG injection    Note to Pharmacy:  [name] : cabinet override      09/30/18 1014 09/30/18 2229      Subjective:   Katie Johnson seen and examined today.  Continues to complain of cough. Denies chest pain, abdominal pain, nausea, vomiting, diarrhea, constipation. Denies shortness of breath. Complains of weakness. Feels this may be attributed to getting a shingles vaccine.  Objective:   Vitals:   10/01/18 0123 10/01/18 0607 10/01/18 0740 10/01/18 0842  BP: 124/61 (!) 150/78    Pulse: 82 97    Resp: 18 20    Temp: (!) 100.7 F (38.2 C) (!) 101.9 F (38.8 C)  98.9 F (37.2 C)  TempSrc: Oral Oral  Oral  SpO2: 92% 93% 91%   Weight:      Height:        Intake/Output Summary (Last 24 hours) at 10/01/2018 1323 Last data filed at 10/01/2018 0709 Gross per 24 hour  Intake 1110.16 ml  Output 400 ml  Net 710.16 ml   Ceasar Mons  Weights   09/30/18 0915  Weight: 73 kg    Exam  General: Well developed, well nourished, NAD, appears stated age  HEENT: NCAT, mucous membranes moist.   Neck: Supple  Cardiovascular: S1 S2 auscultated, RRR, no murmur  Respiratory: Diminished but clear breath sounds, +cough  Abdomen: Soft, nontender, nondistended, + bowel sounds  Extremities: warm dry without cyanosis clubbing or edema  Neuro: AAOx3, nonfocal  Psych: Normal affect and demeanor with intact judgement and insight   Data Reviewed: I have personally reviewed following labs and imaging  studies  CBC: Recent Labs  Lab 09/30/18 0941 10/01/18 0541  WBC 11.9* 12.1*  NEUTROABS 9.0*  --   HGB 13.2 12.6  HCT 41.0 40.5  MCV 100.7* 106.3*  PLT 195 152   Basic Metabolic Panel: Recent Labs  Lab 09/30/18 0941 10/01/18 0541  NA 132* 137  K 3.7 4.5  CL 96* 106  CO2 23 22  GLUCOSE 153* 135*  BUN 17 15  CREATININE 1.14* 1.00  CALCIUM 9.0 8.3*   GFR: Estimated Creatinine Clearance: 45.9 mL/min (by C-G formula based on SCr of 1 mg/dL). Liver Function Tests: Recent Labs  Lab 09/30/18 0941  AST 21  ALT 18  ALKPHOS 61  BILITOT 0.8  PROT 8.4*  ALBUMIN 4.1   No results for input(s): LIPASE, AMYLASE in the last 168 hours. No results for input(s): AMMONIA in the last 168 hours. Coagulation Profile: No results for input(s): INR, PROTIME in the last 168 hours. Cardiac Enzymes: No results for input(s): CKTOTAL, CKMB, CKMBINDEX, TROPONINI in the last 168 hours. BNP (last 3 results) No results for input(s): PROBNP in the last 8760 hours. HbA1C: No results for input(s): HGBA1C in the last 72 hours. CBG: No results for input(s): GLUCAP in the last 168 hours. Lipid Profile: No results for input(s): CHOL, HDL, LDLCALC, TRIG, CHOLHDL, LDLDIRECT in the last 72 hours. Thyroid Function Tests: No results for input(s): TSH, T4TOTAL, FREET4, T3FREE, THYROIDAB in the last 72 hours. Anemia Panel: No results for input(s): VITAMINB12, FOLATE, FERRITIN, TIBC, IRON, RETICCTPCT in the last 72 hours. Urine analysis:    Component Value Date/Time   COLORURINE YELLOW 09/30/2018 0941   APPEARANCEUR CLEAR 09/30/2018 0941   LABSPEC >1.030 (H) 09/30/2018 0941   PHURINE 5.5 09/30/2018 0941   GLUCOSEU NEGATIVE 09/30/2018 0941   HGBUR NEGATIVE 09/30/2018 0941   BILIRUBINUR NEGATIVE 09/30/2018 0941   KETONESUR NEGATIVE 09/30/2018 0941   PROTEINUR 30 (A) 09/30/2018 0941   NITRITE NEGATIVE 09/30/2018 0941   LEUKOCYTESUR NEGATIVE 09/30/2018 0941   Sepsis  Labs: @LABRCNTIP (procalcitonin:4,lacticidven:4)  ) Recent Results (from the past 240 hour(s))  Blood culture (routine x 2)     Status: None (Preliminary result)   Collection Time: 09/30/18  9:30 AM  Result Value Ref Range Status   Specimen Description   Final    BLOOD LEFT ARM Performed at [facility], [address] [place], [place] [address]    Special Requests   Final    BOTTLES DRAWN AEROBIC AND ANAEROBIC Blood Culture adequate volume Performed at [facility], [address] [place], [place] [address]    Culture   Final    NO GROWTH < 24 HOURS Performed at [facility], [address] [place], [place] [address]    Report Status PENDING  Incomplete  Blood culture (routine x 2)     Status: None (Preliminary result)   Collection Time: 09/30/18  9:36 AM  Result Value Ref  Range Status   Specimen Description   Final    BLOOD LEFT HAND Performed at [facility], [address] [place], [place] [address]    Special Requests   Final    BOTTLES DRAWN AEROBIC AND ANAEROBIC Blood Culture adequate volume Performed at [facility], [address] [place], [place] [address]    Culture   Final    NO GROWTH < 24 HOURS Performed at [facility], [address] [place], [place] [address]    Report Status PENDING  Incomplete      Radiology Studies: Dg Chest 2 View  Result Date: 09/30/2018 CLINICAL DATA:  Cough, fever, body aches, generalized weakness for 3 days, return from [place] 1 week ago, history hypertension, GERD EXAM: CHEST - 2 VIEW COMPARISON:  04/08/2013 FINDINGS: Normal heart size, mediastinal contours, and pulmonary vascularity. Opacity identified in the posterior RIGHT mid lung, probably representing pneumonia though requiring followup until resolution to exclude underlying mass. Remaining lungs clear. No pleural effusion or pneumothorax. Diffuse osseous demineralization. IMPRESSION: RIGHT mid lung opacity  posteriorly likely representing pneumonia. However, underlying mass/tumor is not excluded and radiographic follow-up until resolution is required to exclude underlying tumor. Electronically Signed   By: Ulyses SouthwardMark  Boles M.D.   On: 09/30/2018 10:26   Ct Chest Wo Contrast  Result Date: 10/01/2018 CLINICAL DATA:  Inpatient. Acute hypoxic respiratory failure. Right mid lung opacity on chest radiograph. EXAM: CT CHEST WITHOUT CONTRAST TECHNIQUE: Multidetector CT imaging of the chest was performed following the standard protocol without IV contrast. COMPARISON:  Chest radiograph from earlier today. FINDINGS: Cardiovascular: Normal heart size. No significant pericardial effusion/thickening. Left anterior descending coronary atherosclerosis. Atherosclerotic nonaneurysmal thoracic aorta. Normal caliber pulmonary arteries. Mediastinum/Nodes: No discrete thyroid nodules. Unremarkable esophagus. No pathologically enlarged axillary, mediastinal or hilar lymph nodes, noting limited sensitivity for the detection of hilar adenopathy on this noncontrast study. Lungs/Pleura: No pneumothorax. No pleural effusion. No central airway stenoses. There is dense geographic consolidation in the right lower lobe with a few air bronchograms at the periphery and with surrounding patchy ground-glass opacity. There is mild patchy tree-in-bud opacity and ground-glass opacity in the peripheral right middle lobe. Solid 4 mm peripheral right lower lobe pulmonary nodule (series 10/image 76). No discrete lung masses or additional significant pulmonary nodules. Upper abdomen: No acute abnormality. Musculoskeletal: No aggressive appearing focal osseous lesions. Moderate thoracic spondylosis. IMPRESSION: 1. Dense geographic consolidation with peripheral air bronchograms in the right lower lobe. Mild patchy tree-in-bud opacity in the peripheral right middle lobe. No discrete lung masses or central airway stenoses. Findings are most compatible with  multilobar pneumonia. Follow-up chest radiograph recommended in 4-6 weeks to document clearing. 2. One vessel coronary atherosclerosis. Aortic Atherosclerosis (ICD10-I70.0). Electronically Signed   By: Delbert PhenixJason A Poff M.D.   On: 10/01/2018 08:09     Scheduled Meds: . amLODipine  5 mg Oral Daily  . cholecalciferol  2,000 Units Oral Daily  . enoxaparin (LOVENOX) injection  40 mg Subcutaneous Q24H  . guaiFENesin  1,200 mg Oral BID  . ipratropium-albuterol  3 mL Nebulization TID   Continuous Infusions: . sodium chloride 75 mL/hr at 10/01/18 0449  . azithromycin    . cefTRIAXone (ROCEPHIN)  IV 1 g (10/01/18 1049)      Time Spent in minutes   30 minutes  Shekina Cordell D.O. on 10/01/2018 at 1:23 PM  Between 7am to 7pm - Please see pager noted on [email or web address]  After 7pm go to [email or web address]  And look for the night coverage person covering for me after hours  [facility] Office  [phone]

## 2018-10-01 NOTE — Progress Notes (Signed)
MD paged for fever reducer. 500mL bolus ordered. Will continue to monitor.   Justin Mendaudle, Areya Lemmerman H, RN

## 2018-10-01 NOTE — Evaluation (Signed)
Physical Therapy Evaluation Patient Details Name: Katie Johnson MRN: [id number] DOB: [birth year] Today's Date: 10/01/2018   History of Present Illness  82 yo female admitted to ED 11/24 with cough, fever, weakness, and fall. Pt with medical diagnosis of sepsis secondary to suspected CAP, acute hypoxic respiratory failure. Pt reports recently returning from a trip to [place], and all symptoms are new. PMH includes OA, GERD, HTN, AC OA, thoracic pain, CKDIII.   Clinical Impression   Pt presents with dyspnea on exertion, O2sats 87-90s% with ambulation (on 1-2LO2 during session), difficulty performing mobility tasks, altered cognitive status from baseline, and decreased tolerance for ambulation. Pt to benefit from acute PT to address deficits. Pt ambulated 150 ft with RW with min assist for steadying and directing RW. PT recommending HHPT to address mobility deficits upon d/c home. PT to progress mobility as tolerated, and will continue to follow acutely.      Follow Up Recommendations Home health PT;Supervision for mobility/OOB    Equipment Recommendations  Rolling walker with 5" wheels    Recommendations for Other Services       Precautions / Restrictions Precautions Precautions: Fall;Other (comment) Precaution Comments: Droplet, 1LO2 applied, RN order for sats >92% Restrictions Weight Bearing Restrictions: No      Mobility  Bed Mobility Overal bed mobility: Needs Assistance Bed Mobility: Supine to Sit;Sit to Supine     Supine to sit: Supervision;HOB elevated Sit to supine: Min assist;HOB elevated   General bed mobility comments: Supervision for supine to sit, increased time and effort to complete and used bed rails to assist. Sit to supine required more effort, pt requiring LE lift assist to return to bed. Pt with sequencing difficulties throughout. PT assisted pt in scooting up in bed with use of bed pad.  Transfers Overall transfer level: Needs assistance Equipment used:  None Transfers: Sit to/from Stand Sit to Stand: Min assist;From elevated surface         General transfer comment: Min assist for power up and steadying. Pt deferred use of RW for sit to stand. Sit to stand x2, once from bed and once from toilet. Pt required physical assist for pericare and standing from toilet, pt encouraged to use grab bar in bathroom to stand.  Ambulation/Gait Ambulation/Gait assistance: Min assist;Min guard Gait Distance (Feet): 150 Feet Assistive device: Rolling walker (2 wheeled) Gait Pattern/deviations: Step-through pattern;Decreased stride length;Drifts right/left;Trunk flexed Gait velocity: decr    General Gait Details: Mask applied when leaving room to pt due to droplet precautions. Pt with assist for directing RW, steadying, verbal cuing x5 for stepping into RW for safety. Pt dyspnea 3/4 with ambulation, but pt states "I don't feel short of breath". Sats during session ranged from 87-90s, 87% after ambulation and sit to supine. Pt pt on 2LO2 temporarily to return sats to 90s.   Stairs            Wheelchair Mobility    Modified Rankin (Stroke Patients Only)       Balance Overall balance assessment: Needs assistance Sitting-balance support: No upper extremity supported Sitting balance-Leahy Scale: Fair     Standing balance support: Bilateral upper extremity supported Standing balance-Leahy Scale: Poor Standing balance comment: relies of PT and RW for steadying                              Pertinent Vitals/Pain Pain Assessment: No/denies pain    Home Living Family/patient expects to be discharged to::  Private residence Living Arrangements: Spouse/significant other Available Help at Discharge: Family;Available PRN/intermittently Type of Home: House Home Access: Level entry     Home Layout: One level Home Equipment: Grab bars - tub/shower;Grab bars - toilet Additional Comments: Pt reports no falls prior to trip to [place]. Pt  had 1 fall 11/24 that brought her into ED. Pt reports increased dizziness/unsteadiness since return from [place], does not appear to be positional but "when I move my arms or stop moving my arms".     Prior Function Level of Independence: Independent               Hand Dominance   Dominant Hand: Right    Extremity/Trunk Assessment   Upper Extremity Assessment Upper Extremity Assessment: Defer to OT evaluation    Lower Extremity Assessment Lower Extremity Assessment: Generalized weakness    Cervical / Trunk Assessment Cervical / Trunk Assessment: Kyphotic  Communication   Communication: HOH  Cognition Arousal/Alertness: Awake/alert Behavior During Therapy: WFL for tasks assessed/performed Overall Cognitive Status: Impaired/Different from baseline Area of Impairment: Orientation;Following commands;Safety/judgement;Problem solving                 Orientation Level: Place;Disoriented to     Following Commands: Follows one step commands with increased time Safety/Judgement: Decreased awareness of deficits;Decreased awareness of safety   Problem Solving: Difficulty sequencing;Decreased initiation;Slow processing;Requires tactile cues;Requires verbal cues General Comments: When asked where pt was, pt states "I am in the building". When asked what hospital system, pt states "I know where I am". After long delay, pt states [facility] Pt delayed in giving responses to PT questions, decreased safety awareness with use of RW. Verbal and tactile cuing used throughout session. Pt with mild urinary and fecal incotinence/lack of awareness of having BM after wiping and seeing feces.        General Comments General comments (skin integrity, edema, etc.): OM exam: smooth pursuits WNL all direction, end gaze nystagmus noted with R gaze. Pt with difficulty following directions of holding head still and just moving eyes. Pt with no dizziness with bed mobility, turning  head over L and R shoulder.     Exercises     Assessment/Plan    PT Assessment Patient needs continued PT services  PT Problem List Decreased strength;Decreased cognition;Decreased activity tolerance;Decreased knowledge of use of DME;Decreased balance;Decreased safety awareness;Decreased mobility       PT Treatment Interventions DME instruction;Therapeutic activities;Gait training;Therapeutic exercise;Patient/family education;Balance training;Functional mobility training    PT Goals (Current goals can be found in the Care Plan section)  Acute Rehab PT Goals Patient Stated Goal: none stated  PT Goal Formulation: With patient Time For Goal Achievement: 10/15/18 Potential to Achieve Goals: Good    Frequency Min 3X/week   Barriers to discharge        Co-evaluation               AM-PAC PT "6 Clicks" Mobility  Outcome Measure Help needed turning from your back to your side while in a flat bed without using bedrails?: A Little Help needed moving from lying on your back to sitting on the side of a flat bed without using bedrails?: A Little Help needed moving to and from a bed to a chair (including a wheelchair)?: A Little Help needed standing up from a chair using your arms (e.g., wheelchair or bedside chair)?: A Little Help needed to walk in hospital room?: A Little Help needed climbing 3-5 steps with a railing? : A  Lot 6 Click Score: 17    End of Session Equipment Utilized During Treatment: Gait belt;Oxygen(1-2L O2 during session) Activity Tolerance: Patient limited by fatigue;Patient tolerated treatment well Patient left: in bed;with bed alarm set;with call bell/phone within reach Nurse Communication: Mobility status PT Visit Diagnosis: Difficulty in walking, not elsewhere classified (R26.2);Muscle weakness (generalized) (M62.81)    Time: 1226-1310 PT Time Calculation (min) (ACUTE ONLY): 44 min   Charges:   PT Evaluation $PT Eval Low Complexity: 1 Low PT  Treatments $Gait Training: 8-22 mins $Therapeutic Activity: 8-22 mins        Nicola PoliceAlexa D Shirah Roseman, PT Acute Rehabilitation Services Pager [phone]  Office [phone]   Darionna Banke D Emmajane Altamura 10/01/2018, 1:46 PM

## 2018-10-02 DIAGNOSIS — R531 Weakness: Secondary | ICD-10-CM

## 2018-10-02 DIAGNOSIS — A419 Sepsis, unspecified organism: Principal | ICD-10-CM

## 2018-10-02 DIAGNOSIS — J181 Lobar pneumonia, unspecified organism: Secondary | ICD-10-CM

## 2018-10-02 LAB — CBC
HEMATOCRIT: 39.9 % (ref 36.0–46.0)
HEMOGLOBIN: 12.7 g/dL (ref 12.0–15.0)
MCH: 33.2 pg (ref 26.0–34.0)
MCHC: 31.8 g/dL (ref 30.0–36.0)
MCV: 104.5 fL — ABNORMAL HIGH (ref 80.0–100.0)
NRBC: 0 % (ref 0.0–0.2)
Platelets: 165 10*3/uL (ref 150–400)
RBC: 3.82 MIL/uL — ABNORMAL LOW (ref 3.87–5.11)
RDW: 13.2 % (ref 11.5–15.5)
WBC: 13.2 10*3/uL — AB (ref 4.0–10.5)

## 2018-10-02 LAB — MRSA PCR SCREENING: MRSA BY PCR: NEGATIVE

## 2018-10-02 MED ORDER — VANCOMYCIN HCL 10 G IV SOLR
1500.0000 mg | Freq: Once | INTRAVENOUS | Status: AC
  Administered 2018-10-02: 1500 mg via INTRAVENOUS
  Filled 2018-10-02: qty 1500

## 2018-10-02 MED ORDER — VANCOMYCIN HCL IN DEXTROSE 1-5 GM/200ML-% IV SOLN: 1000.0000 mg | INTRAVENOUS | Status: DC

## 2018-10-02 MED ORDER — ORAL CARE MOUTH RINSE
15.0000 mL | Freq: Two times a day (BID) | OROMUCOSAL | Status: DC
  Administered 2018-10-03 – 2018-10-06 (×7): 15 mL via OROMUCOSAL

## 2018-10-02 MED ORDER — SODIUM CHLORIDE 0.9 % IV SOLN
1.0000 g | Freq: Two times a day (BID) | INTRAVENOUS | Status: DC
  Administered 2018-10-02 – 2018-10-03 (×4): 1 g via INTRAVENOUS
  Filled 2018-10-02 (×6): qty 1

## 2018-10-02 NOTE — Consult Note (Signed)
NAME:  Katie Johnson, MRN:  [id number], DOB:  11/07/1935, LOS: 2 ADMISSION DATE:  09/30/2018, CONSULTATION DATE:  10/03/18 REFERRING MD:  Catha GosselinMikhail, CHIEF COMPLAINT: Shortness of breath  Brief History   82 year old lady who came in with complaints of productive cough, poor appetite about 3 days duration She recently returned from a trip to [place] about a week ago She received shingles shot about a week ago She stated she was not feeling poorly until after shot She had a fever Currently being managed for pneumonia  History of present illness    She had recently been on the trip Other people on the trip also did come down with time a febrile infection Symptoms started with cough, shortness of breath, poor appetite Low oxygen levels when she came in with a fever of 102.8 She feels a little bit better at the present time she still coughing-not really bringing up any secretions  Past Medical History  She is usually very healthy Hypertension which is well controlled  Significant Hospital Events   Progressive shortness of breath with increased oxygen requirement  Consults:  Pulmonary 10/02/2018  Procedures:  None  Significant Diagnostic Tests:  CT scan of the chest showing extensive infiltrate at the right base  Micro Data:   Respiratory viral panel negative MRSA by PCR negative   Antimicrobials:  Vancomycin 11/26 Cefepime 11/26>> Ceftriaxone 11/24-25 Azithromycin 11/24-25  Interim history/subjective:  She still coughing, feeling poorly Increased oxygen requirement  Objective   Blood pressure (!) 143/77, pulse 96, temperature 99.2 F (37.3 C), temperature source Oral, resp. rate (!) 21, height 5\' 6"  (1.676 m), weight 73 kg, SpO2 93 %.        Intake/Output Summary (Last 24 hours) at 10/02/2018 1829 Last data filed at 10/02/2018 1600 Gross per 24 hour  Intake 2630.57 ml  Output 400 ml  Net 2230.57 ml   Filed Weights   09/30/18 0915  Weight: 73 kg     Examination: General: Elderly lady, does not appear toxic HENT: Moist oral mucosa Lungs: Good entry bilaterally, rales bibasaly Cardiovascular: S1-S2 appreciated, no murmur Abdomen: Bowel sounds appreciated Extremities: No edema, no clubbing Neuro: Alert and oriented x3, moving all extremities  Resolved Hospital Problem list     Assessment & Plan:  Sepsis secondary to community-acquired pneumonia -Antibiotics escalated secondary to worsening symptomatology -MRSA PCR negative-if no fever in the next 24 hours will discontinue vancomycin on 11/27 -Sputum for Gram stain and cultures if able to cough up any secretions -Expectorants -Continue flutter device use  Multilobar pneumonia Will follow cultures  Hypoxemic respiratory failure -Continue oxygen supplementation -This is likely secondary to the extensive infiltrative process  Hypertension -Continue amlodipine  Hyponatremia  Chronic kidney disease -Avoid nephrotoxic's  Best practice:  Diet: As ordered as tolerated DVT prophylaxis: Lovenox Mobility: As tolerated Code Status: Full code Family Communication: No family at bedside Disposition:   Labs   CBC: Recent Labs  Lab 09/30/18 0941 10/01/18 0541 10/02/18 0607  WBC 11.9* 12.1* 13.2*  NEUTROABS 9.0*  --   --   HGB 13.2 12.6 12.7  HCT 41.0 40.5 39.9  MCV 100.7* 106.3* 104.5*  PLT 195 152 165    Basic Metabolic Panel: Recent Labs  Lab 09/30/18 0941 10/01/18 0541  NA 132* 137  K 3.7 4.5  CL 96* 106  CO2 23 22  GLUCOSE 153* 135*  BUN 17 15  CREATININE 1.14* 1.00  CALCIUM 9.0 8.3*   GFR: Estimated Creatinine Clearance: 45.9 mL/min (by C-G  formula based on SCr of 1 mg/dL). Recent Labs  Lab 09/30/18 0941 09/30/18 0946 09/30/18 1605 10/01/18 0541 10/02/18 0607  PROCALCITON  --   --  <0.10  --   --   WBC 11.9*  --   --  12.1* 13.2*  LATICACIDVEN  --  1.58  --   --   --     Liver Function Tests: Recent Labs  Lab 09/30/18 0941  AST 21   ALT 18  ALKPHOS 61  BILITOT 0.8  PROT 8.4*  ALBUMIN 4.1   No results for input(s): LIPASE, AMYLASE in the last 168 hours. No results for input(s): AMMONIA in the last 168 hours.  ABG No results found for: PHART, PCO2ART, PO2ART, HCO3, TCO2, ACIDBASEDEF, O2SAT   Coagulation Profile: No results for input(s): INR, PROTIME in the last 168 hours.  Cardiac Enzymes: No results for input(s): CKTOTAL, CKMB, CKMBINDEX, TROPONINI in the last 168 hours.  HbA1C: No results found for: HGBA1C  CBG: No results for input(s): GLUCAP in the last 168 hours.  Review of Systems:   Review of Systems  Constitutional: Negative.   HENT: Negative.   Eyes: Negative.   Respiratory: Positive for cough and shortness of breath. Negative for sputum production.   Gastrointestinal: Negative.   Genitourinary: Negative.   Skin: Negative.   All other systems reviewed and are negative.    Past Medical History  She,  has a past medical history of Arthritis, Chicken pox, GERD (gastroesophageal reflux disease), High blood pressure, and Hypertension.   Surgical History   History reviewed. No pertinent surgical history.   Social History   reports that she has never smoked. She has never used smokeless tobacco.   Family History   Her family history includes COPD in her mother; Diabetes in her father; Heart disease in her father and mother.   Allergies Allergies  Allergen Reactions  . Shellfish-Derived Products Anaphylaxis  . Statins Diarrhea and Nausea And Vomiting  . Pravastatin Diarrhea  . Ketoprofen Rash     Home Medications  Prior to Admission medications   Medication Sig Start Date End Date Taking? Authorizing Provider  amLODipine-benazepril (LOTREL) 10-20 MG capsule Take 1 capsule by mouth daily. 11/29/17  Yes Copland, Gwenlyn Found, MD  cholecalciferol (VITAMIN D) 1000 units tablet Take 2,000 Units by mouth daily.   Yes [provider]  diclofenac sodium (VOLTAREN) 1 % GEL Apply 2 g  topically 4 (four) times daily. 01/16/18  Yes Judi Saa, DO  Multiple Vitamins-Minerals (CENTRUM ADULTS PO) Take by mouth.   Yes [provider]

## 2018-10-02 NOTE — Progress Notes (Signed)
Pharmacy Antibiotic Note  Katie Johnson is a 82 y.o. female admitted on 09/30/2018 with CAP. Patient initially started on Rocephin and Azithromycin but continues to have fevers and worsening leukocytosis. Endorses sick contacts but influenza panel and respiratory virus panel both negative. CXR reviewed and does show significant infiltrate; escalating coverage today. Pharmacy has been consulted for vancomycin dosing; cefepime per MD  Plan:  Vancomycin 1500 mg IV now, then 1000 mg IV q24 hr (est AUC 534 based on SCr 1)  Measure vancomycin AUC at steady state as indicated  Checking MRSA PCR  Cefepime 1 g IV q12 hr  SCr q48hr while on vanc    Height: 5\' 6"  (167.6 cm) Weight: 161 lb (73 kg) IBW/kg (Calculated) : 59.3  Temp (24hrs), Avg:100.4 F (38 C), Min:98.6 F (37 C), Max:102.6 F (39.2 C)  Recent Labs  Lab 09/30/18 0941 09/30/18 0946 10/01/18 0541 10/02/18 0607  WBC 11.9*  --  12.1* 13.2*  CREATININE 1.14*  --  1.00  --   LATICACIDVEN  --  1.58  --   --     Estimated Creatinine Clearance: 45.9 mL/min (by C-G formula based on SCr of 1 mg/dL).    Allergies  Allergen Reactions  . Shellfish-Derived Products Anaphylaxis  . Statins Diarrhea and Nausea And Vomiting  . Pravastatin Diarrhea  . Ketoprofen Rash    Antimicrobials this admission: 11/24 Rocephin/Zithromax >> 11/26 11/26 Vancomcyin >>  11/26 Cefepime >>  Dose adjustments this admission: n/a  Microbiology results: 11/24 BCx: ngtd 11/24 influenza A/B: neg 11/25 resp virus panel: neg 11/26 MRSA PCR: pending  Thank you for allowing pharmacy to be a part of this patient's care.  Bernadene Personrew Marco Raper, PharmD, BCPS [phone] 10/02/2018, 11:19 AM

## 2018-10-02 NOTE — Progress Notes (Addendum)
PROGRESS NOTE    Katie Johnson  ZOX:[id number] DOB: 1936/02/09 DOA: 09/30/2018 PCP: Pearline Cables, MD   Brief Narrative:  HPI on 09/30/2018 by Dr. Dow Adolph [name] is a 82 y.o. female with medical history significant for hypertension, hyperlipidemia, hearing loss, who presented to [facility] ED with complaints of worsening productive cough and poor appetite of 3-days duration.  Recently returned from a trip to [place] 1 week ago.  Denies chest pain or dyspnea at rest.  Admits to close contact exposure to flu.  Upon presentation to the ED patient is hypoxic and febrile with T-max of 102.8, tachypneic with respiration rate 28.  Chest x-ray revealed right middle lobe infiltrate with suspicion for community-acquired pneumonia.  TRH asked to admit. Assessment & Plan   Sepsis secondary to community-acquired pneumonia -Patient presented with tachypnea, fever -Chest x-ray reviewed which noted infiltrates in the right middle lobe -CT chest: Dense geographic consolidation with peripheral air bronchograms right lower lobe.  Mild patchy tree-in-bud opacity in the peripheral right middle lobe.  No discrete lung masses or central airway stenosis.  Findings most compatible with multilobar pneumonia.  Repeat x-ray in 4 to 6 weeks. -Influenza PCR and respiratory viral panel negative -Continue nebulizer treatments, Mucinex, flutter valve -Patient continued to spike fevers with increased WBC -Will transition from azithromycin and ceftriaxone to more broader coverage with vancomycin and cefepime -Obtain MRSA swab  Essential hypertension -continue amlodipine  Hyponatremia -Resolved with IVF -Sodium currently 137 (was 132 on admission)  Chronic kidney disease, stage III -Creatinine stable, continue to monitor BMP  Generalized weakness -Likely secondary to pneumonia, will consult PT  DVT Prophylaxis  lovenox  Code Status: Full  Family Communication: Husband at bedside  Disposition Plan: Admitted.  Suspect home when medically stable. Continues to spike fevers >100F.   Consultants None  Procedures  None  Antibiotics   Anti-infectives (From admission, onward)   Start     Dose/Rate Route Frequency Ordered Stop   10/03/18 1000  vancomycin (VANCOCIN) IVPB 1000 mg/200 mL premix     1,000 mg 200 mL/hr over 60 Minutes Intravenous Every 24 hours 10/02/18 1138     10/02/18 1000  ceFEPIme (MAXIPIME) 1 g in sodium chloride 0.9 % 100 mL IVPB     1 g 200 mL/hr over 30 Minutes Intravenous Every 12 hours 10/02/18 0938     10/02/18 1000  vancomycin (VANCOCIN) 1,500 mg in sodium chloride 0.9 % 500 mL IVPB     1,500 mg 250 mL/hr over 120 Minutes Intravenous  Once 10/02/18 0940     10/01/18 1200  azithromycin (ZITHROMAX) 500 mg in sodium chloride 0.9 % 250 mL IVPB  Status:  Discontinued     500 mg 250 mL/hr over 60 Minutes Intravenous Every 24 hours 09/30/18 1319 10/02/18 0938   10/01/18 1100  cefTRIAXone (ROCEPHIN) 1 g in sodium chloride 0.9 % 100 mL IVPB  Status:  Discontinued     1 g 200 mL/hr over 30 Minutes Intravenous Every 24 hours 09/30/18 1319 10/02/18 0938   09/30/18 1015  cefTRIAXone (ROCEPHIN) 1 g in sodium chloride 0.9 % 100 mL IVPB     1 g 200 mL/hr over 30 Minutes Intravenous  Once 09/30/18 1012 09/30/18 1209   09/30/18 1015  azithromycin (ZITHROMAX) 500 mg in sodium chloride 0.9 % 250 mL IVPB     500 mg 250 mL/hr over 60 Minutes Intravenous  Once 09/30/18 1012 09/30/18 1137   09/30/18 1014  azithromycin (ZITHROMAX) 500 MG injection  Note to Pharmacy:  Katie Johnson, Katie Johnson : cabinet override      09/30/18 1014 09/30/18 2229      Subjective:   Katie Johnson seen and examined today.  Continues to have cough and some shortness of breath.  Denies current chest pain, abdominal pain, nausea or vomiting, diarrhea or constipation, dizziness or headache.  Does endorse feeling weak and having poor appetite.  Objective:   Vitals:   10/01/18 2102 10/02/18 0425 10/02/18 0800 10/02/18  1045  BP: 122/65 135/72    Pulse: 82 81    Resp: (!) 22 20    Temp: 99.7 F (37.6 C) 98.6 F (37 C)    TempSrc: Oral Oral    SpO2: 94% 95% 92% (!) 88%  Weight:      Height:        Intake/Output Summary (Last 24 hours) at 10/02/2018 1222 Last data filed at 10/02/2018 0930 Gross per 24 hour  Intake 1673.35 ml  Output 1100 ml  Net 573.35 ml   Filed Weights   09/30/18 0915  Weight: 73 kg   Exam  General: Well developed, well nourished, NAD, appears stated age  HEENT: NCAT, mucous membranes moist.   Neck: Supple  Cardiovascular: S1 S2 auscultated, no RRR, No murmur  Respiratory: Diminished breath sounds with scattered rhonchi.  +cough  Abdomen: Soft, nontender, nondistended, + bowel sounds  Extremities: warm dry without cyanosis clubbing or edema  Neuro: AAOx3, nonfocal  Psych: Normal affect and demeanor, pleasant  Data Reviewed: I have personally reviewed following labs and imaging studies  CBC: Recent Labs  Lab 09/30/18 0941 10/01/18 0541 10/02/18 0607  WBC 11.9* 12.1* 13.2*  NEUTROABS 9.0*  --   --   HGB 13.2 12.6 12.7  HCT 41.0 40.5 39.9  MCV 100.7* 106.3* 104.5*  PLT 195 152 165   Basic Metabolic Panel: Recent Labs  Lab 09/30/18 0941 10/01/18 0541  NA 132* 137  K 3.7 4.5  CL 96* 106  CO2 23 22  GLUCOSE 153* 135*  BUN 17 15  CREATININE 1.14* 1.00  CALCIUM 9.0 8.3*   GFR: Estimated Creatinine Clearance: 45.9 mL/min (by C-G formula based on SCr of 1 mg/dL). Liver Function Tests: Recent Labs  Lab 09/30/18 0941  AST 21  ALT 18  ALKPHOS 61  BILITOT 0.8  PROT 8.4*  ALBUMIN 4.1   No results for input(s): LIPASE, AMYLASE in the last 168 hours. No results for input(s): AMMONIA in the last 168 hours. Coagulation Profile: No results for input(s): INR, PROTIME in the last 168 hours. Cardiac Enzymes: No results for input(s): CKTOTAL, CKMB, CKMBINDEX, TROPONINI in the last 168 hours. BNP (last 3 results) No results for input(s): PROBNP in  the last 8760 hours. HbA1C: No results for input(s): HGBA1C in the last 72 hours. CBG: No results for input(s): GLUCAP in the last 168 hours. Lipid Profile: No results for input(s): CHOL, HDL, LDLCALC, TRIG, CHOLHDL, LDLDIRECT in the last 72 hours. Thyroid Function Tests: No results for input(s): TSH, T4TOTAL, FREET4, T3FREE, THYROIDAB in the last 72 hours. Anemia Panel: No results for input(s): VITAMINB12, FOLATE, FERRITIN, TIBC, IRON, RETICCTPCT in the last 72 hours. Urine analysis:    Component Value Date/Time   COLORURINE YELLOW 09/30/2018 0941   APPEARANCEUR CLEAR 09/30/2018 0941   LABSPEC >1.030 (H) 09/30/2018 0941   PHURINE 5.5 09/30/2018 0941   GLUCOSEU NEGATIVE 09/30/2018 0941   HGBUR NEGATIVE 09/30/2018 0941   BILIRUBINUR NEGATIVE 09/30/2018 0941   KETONESUR NEGATIVE 09/30/2018 0941   PROTEINUR  30 (A) 09/30/2018 0941   NITRITE NEGATIVE 09/30/2018 0941   LEUKOCYTESUR NEGATIVE 09/30/2018 0941   Sepsis Labs: @LABRCNTIP (procalcitonin:4,lacticidven:4)  ) Recent Results (from the past 240 hour(s))  Blood culture (routine x 2)     Status: None (Preliminary result)   Collection Time: 09/30/18  9:30 AM  Result Value Ref Range Status   Specimen Description   Final    BLOOD LEFT ARM Performed at [facility], [address] [place], [place] [address]    Special Requests   Final    BOTTLES DRAWN AEROBIC AND ANAEROBIC Blood Culture adequate volume Performed at [facility], [address] [place], [place] [address]    Culture   Final    NO GROWTH 2 DAYS Performed at [facility], [address], [place], [place] [address]    Report Status PENDING  Incomplete  Blood culture (routine x 2)     Status: None (Preliminary result)   Collection Time: 09/30/18  9:36 AM  Result Value Ref Range Status   Specimen Description   Final    BLOOD LEFT HAND Performed at [facility], [address] [place], [place] [address]    Special  Requests   Final    BOTTLES DRAWN AEROBIC AND ANAEROBIC Blood Culture adequate volume Performed at [facility], [address] [place], [place] [address]    Culture   Final    NO GROWTH 2 DAYS Performed at [facility], [address], [place], [place] [address]    Report Status PENDING  Incomplete  Respiratory Panel by PCR     Status: None   Collection Time: 10/01/18  9:10 AM  Result Value Ref Range Status   Adenovirus NOT DETECTED NOT DETECTED Final   Coronavirus 229E NOT DETECTED NOT DETECTED Final   Coronavirus HKU1 NOT DETECTED NOT DETECTED Final   Coronavirus NL63 NOT DETECTED NOT DETECTED Final   Coronavirus OC43 NOT DETECTED NOT DETECTED Final   Metapneumovirus NOT DETECTED NOT DETECTED Final   Rhinovirus / Enterovirus NOT DETECTED NOT DETECTED Final   Influenza A NOT DETECTED NOT DETECTED Final   Influenza B NOT DETECTED NOT DETECTED Final   Parainfluenza Virus 1 NOT DETECTED NOT DETECTED Final   Parainfluenza Virus 2 NOT DETECTED NOT DETECTED Final   Parainfluenza Virus 3 NOT DETECTED NOT DETECTED Final   Parainfluenza Virus 4 NOT DETECTED NOT DETECTED Final   Respiratory Syncytial Virus NOT DETECTED NOT DETECTED Final   Bordetella pertussis NOT DETECTED NOT DETECTED Final   Chlamydophila pneumoniae NOT DETECTED NOT DETECTED Final   Mycoplasma pneumoniae NOT DETECTED NOT DETECTED Final    Comment: Performed at [facility], [address] [place], [place] [address]      Radiology Studies: Ct Chest Wo Contrast  Result Date: 10/01/2018 CLINICAL DATA:  Inpatient. Acute hypoxic respiratory failure. Right mid lung opacity on chest radiograph. EXAM: CT CHEST WITHOUT CONTRAST TECHNIQUE: Multidetector CT imaging of the chest was performed following the standard protocol without IV contrast. COMPARISON:  Chest radiograph from earlier today. FINDINGS: Cardiovascular: Normal heart size. No significant pericardial effusion/thickening. Left anterior  descending coronary atherosclerosis. Atherosclerotic nonaneurysmal thoracic aorta. Normal caliber pulmonary arteries. Mediastinum/Nodes: No discrete thyroid nodules. Unremarkable esophagus. No pathologically enlarged axillary, mediastinal or hilar lymph nodes, noting limited sensitivity for the detection of hilar adenopathy on this noncontrast study. Lungs/Pleura: No pneumothorax. No pleural effusion. No central airway stenoses. There is  dense geographic consolidation in the right lower lobe with a few air bronchograms at the periphery and with surrounding patchy ground-glass opacity. There is mild patchy tree-in-bud opacity and ground-glass opacity in the peripheral right middle lobe. Solid 4 mm peripheral right lower lobe pulmonary nodule (series 10/image 76). No discrete lung masses or additional significant pulmonary nodules. Upper abdomen: No acute abnormality. Musculoskeletal: No aggressive appearing focal osseous lesions. Moderate thoracic spondylosis. IMPRESSION: 1. Dense geographic consolidation with peripheral air bronchograms in the right lower lobe. Mild patchy tree-in-bud opacity in the peripheral right middle lobe. No discrete lung masses or central airway stenoses. Findings are most compatible with multilobar pneumonia. Follow-up chest radiograph recommended in 4-6 weeks to document clearing. 2. One vessel coronary atherosclerosis. Aortic Atherosclerosis (ICD10-I70.0). Electronically Signed   By: Delbert Phenix M.D.   On: 10/01/2018 08:09     Scheduled Meds: . amLODipine  5 mg Oral Daily  . cholecalciferol  2,000 Units Oral Daily  . enoxaparin (LOVENOX) injection  40 mg Subcutaneous Q24H  . guaiFENesin  1,200 mg Oral BID  . ipratropium-albuterol  3 mL Nebulization TID   Continuous Infusions: . sodium chloride 75 mL/hr at 10/02/18 0730  . ceFEPime (MAXIPIME) IV 1 g (10/02/18 1133)  . vancomycin 1,500 mg (10/02/18 1145)  . [START ON 10/03/2018] vancomycin        Time Spent in minutes    45 minutes (greater than 50% of time spent with patient and husband face to face, as well as reviewing records, and formulating a plan)   Edsel Petrin D.O. on 10/02/2018 at 12:22 PM  Between 7am to 7pm - Please see pager noted on [email or web address]  After 7pm go to [email or web address]  And look for the night coverage person covering for me after hours  [facility] Office  [phone]

## 2018-10-02 NOTE — Evaluation (Signed)
Occupational Therapy Evaluation Patient Details Name: Katie Johnson MRN: [id number] DOB: 01/20/1936 Today's Date: 10/02/2018    History of Present Illness 82 yo female admitted to ED 11/24 with cough, fever, weakness, and fall. Pt with medical diagnosis of sepsis secondary to suspected CAP, acute hypoxic respiratory failure. Pt reports recently returning from a trip to [place], and all symptoms are new. PMH includes OA, GERD, HTN, AC OA, thoracic pain, CKDIII.    Clinical Impression   Pt up to chair with walker after performing periarea hygiene. Pt tolerated session well but did note 1-2/4 dyspnea with activity at times. Pt able to take short rest breaks and continue with tasks. Spouse present for session. Pt at 88% on 2L at the lowest and up to 90% with rest on 2L. Pt will benefit from continue OT to progress ADL independence.  Will follow.    Follow Up Recommendations  Supervision/Assistance - 24 hour;Home health OT    Equipment Recommendations  None recommended by OT    Recommendations for Other Services       Precautions / Restrictions Precautions Precautions: Fall Precaution Comments: monitor O2      Mobility Bed Mobility Overal bed mobility: Needs Assistance Bed Mobility: Supine to Sit     Supine to sit: Supervision;HOB elevated     General bed mobility comments: increased time with HOB elevated. Pt used rails to assist.   Transfers Overall transfer level: Needs assistance Equipment used: Rolling walker (2 wheeled) Transfers: Sit to/from Stand Sit to Stand: Min assist         General transfer comment: min assist to rise and steady. Cues for hand placement and overall safety. Increased time.    Balance                                           ADL either performed or assessed with clinical judgement   ADL Overall ADL's : Needs assistance/impaired Eating/Feeding: Independent;Sitting   Grooming: Wash/dry hands;Set up;Sitting   Upper Body  Bathing: Set up;Sitting   Lower Body Bathing: Minimal assistance;Sit to/from stand Lower Body Bathing Details (indicate cue type and reason): for thoroughness with cleaning after BM Upper Body Dressing : Set up;Sitting   Lower Body Dressing: Sit to/from stand;Minimal assistance Lower Body Dressing Details (indicate cue type and reason): light balance support especially with increased forward flexion to reach and also min assist for sit to stand aspect  Toilet Transfer: Minimal assistance;RW;Stand-pivot   Toileting- Clothing Manipulation and Hygiene: Minimal assistance;Sit to/from stand         General ADL Comments: Pt requires min assist for sit to stand aspect to help with weight shift and rise to standing. Pt tends to lean very forward with trying to perform toilet hygiene so cautioned pt to not overreach/bend. Pt's spouse present and supportive. Pt is very motivated and stated she just returned from traveling and was regularly doing yoga. O2 sats 90% on 2L and down to 88% at lowest. With rest, returned to 90%. Educated on not holding breath and breathing through ADl tasks.      Vision Patient Visual Report: No change from baseline       Perception     Praxis      Pertinent Vitals/Pain Pain Assessment: No/denies pain     Hand Dominance Right   Extremity/Trunk Assessment Upper Extremity Assessment Upper Extremity Assessment: Generalized weakness  Communication Communication Communication: HOH   Cognition Arousal/Alertness: Awake/alert Behavior During Therapy: WFL for tasks assessed/performed Overall Cognitive Status: Impaired/Different from baseline Area of Impairment: Safety/judgement;Memory                       Following Commands: Follows one step commands consistently Safety/Judgement: Decreased awareness of safety   Problem Solving: Requires verbal cues General Comments: Pt had told OT she had an IV issue earlier today and repeated this  information later in session to therapist. She also leaned very far forward with performing hygiene and gave pt cue to not bend so far for safety.    General Comments       Exercises     Shoulder Instructions      Home Living Family/patient expects to be discharged to:: Private residence Living Arrangements: Spouse/significant other Available Help at Discharge: Family;Available PRN/intermittently Type of Home: House Home Access: Level entry     Home Layout: One level     Bathroom Shower/Tub: Tub/shower unit;Walk-in shower   Bathroom Toilet: Handicapped height Bathroom Accessibility: Yes   Home Equipment: Grab bars - tub/shower;Grab bars - toilet;Shower seat - built in   Additional Comments: Pt reports no falls prior to trip to [place]. Pt had 1 fall 11/24 that brought her into ED. Pt reports increased dizziness/unsteadiness since return from [place], does not appear to be positional but "when I move my arms or stop moving my arms".       Prior Functioning/Environment Level of Independence: Independent                 OT Problem List: Decreased strength;Decreased knowledge of use of DME or AE      OT Treatment/Interventions: Self-care/ADL training;DME and/or AE instruction;Therapeutic activities;Patient/family education    OT Goals(Current goals can be found in the care plan section) Acute Rehab OT Goals Patient Stated Goal: to get up to chair today OT Goal Formulation: With patient/family Time For Goal Achievement: 10/16/18 Potential to Achieve Goals: Good  OT Frequency: Min 2X/week   Barriers to D/C:            Co-evaluation              AM-PAC OT "6 Clicks" Daily Activity     Outcome Measure Help from another person eating meals?: None Help from another person taking care of personal grooming?: A Little Help from another person toileting, which includes using toliet, bedpan, or urinal?: A Little Help from another person bathing (including washing,  rinsing, drying)?: A Little Help from another person to put on and taking off regular upper body clothing?: A Little Help from another person to put on and taking off regular lower body clothing?: A Little 6 Click Score: 19   End of Session Equipment Utilized During Treatment: Rolling walker  Activity Tolerance: Patient tolerated treatment well Patient left: in chair;with call bell/phone within reach;with chair alarm set  OT Visit Diagnosis: Unsteadiness on feet (R26.81);Muscle weakness (generalized) (M62.81)                Time: 1020-1109 OT Time Calculation (min): 49 min Charges:  OT General Charges $OT Visit: 1 Visit OT Evaluation $OT Eval Low Complexity: 1 Low OT Treatments $Self Care/Home Management : 8-22 mins $Therapeutic Activity: 8-22 mins    Judithann Sauger OTR/L Acute Rehabilitation [phone] office number    10/02/2018, 11:51 AM

## 2018-10-02 NOTE — Progress Notes (Signed)
Notified by NT, Pt. O2 sats at 88%. This RN to assess patient SPO2 maintaining at 84% on 2 L. Oxygen increased. RT to bedside and breathing treatment given, without oxygen demand decrease. Oxygen switched to Hi flow [place] patient to 10 L sating 90-93%. MD made aware. Pt. To transfer to stepdown.

## 2018-10-03 ENCOUNTER — Inpatient Hospital Stay (HOSPITAL_COMMUNITY): Payer: Medicare Other

## 2018-10-03 DIAGNOSIS — R7989 Other specified abnormal findings of blood chemistry: Secondary | ICD-10-CM

## 2018-10-03 DIAGNOSIS — E871 Hypo-osmolality and hyponatremia: Secondary | ICD-10-CM

## 2018-10-03 DIAGNOSIS — R531 Weakness: Secondary | ICD-10-CM

## 2018-10-03 DIAGNOSIS — D539 Nutritional anemia, unspecified: Secondary | ICD-10-CM

## 2018-10-03 DIAGNOSIS — N183 Chronic kidney disease, stage 3 unspecified: Secondary | ICD-10-CM

## 2018-10-03 DIAGNOSIS — J9601 Acute respiratory failure with hypoxia: Secondary | ICD-10-CM

## 2018-10-03 DIAGNOSIS — R945 Abnormal results of liver function studies: Secondary | ICD-10-CM

## 2018-10-03 DIAGNOSIS — J189 Pneumonia, unspecified organism: Secondary | ICD-10-CM

## 2018-10-03 LAB — COMPREHENSIVE METABOLIC PANEL
ALT: 45 U/L — ABNORMAL HIGH (ref 0–44)
ANION GAP: 8 (ref 5–15)
AST: 63 U/L — AB (ref 15–41)
Albumin: 2.8 g/dL — ABNORMAL LOW (ref 3.5–5.0)
Alkaline Phosphatase: 56 U/L (ref 38–126)
BILIRUBIN TOTAL: 0.7 mg/dL (ref 0.3–1.2)
BUN: 10 mg/dL (ref 8–23)
CHLORIDE: 105 mmol/L (ref 98–111)
CO2: 23 mmol/L (ref 22–32)
Calcium: 8.1 mg/dL — ABNORMAL LOW (ref 8.9–10.3)
Creatinine, Ser: 0.94 mg/dL (ref 0.44–1.00)
GFR calc Af Amer: >60 mL/min (ref >60)
GFR, EST NON AFRICAN AMERICAN: 57 mL/min — AB (ref >60)
Glucose, Bld: 133 mg/dL — ABNORMAL HIGH (ref 70–99)
POTASSIUM: 3.6 mmol/L (ref 3.5–5.1)
Sodium: 136 mmol/L (ref 135–145)
TOTAL PROTEIN: 6.6 g/dL (ref 6.5–8.1)

## 2018-10-03 LAB — CBC WITH DIFFERENTIAL/PLATELET
Abs Immature Granulocytes: 0.11 10*3/uL — ABNORMAL HIGH (ref 0.00–0.07)
BASOS ABS: 0.1 10*3/uL (ref 0.0–0.1)
BASOS PCT: 1 %
EOS PCT: 0 %
Eosinophils Absolute: 0 10*3/uL (ref 0.0–0.5)
HEMATOCRIT: 35.5 % — AB (ref 36.0–46.0)
Hemoglobin: 11.4 g/dL — ABNORMAL LOW (ref 12.0–15.0)
IMMATURE GRANULOCYTES: 1 %
LYMPHS ABS: 1.3 10*3/uL (ref 0.7–4.0)
Lymphocytes Relative: 12 %
MCH: 32.7 pg (ref 26.0–34.0)
MCHC: 32.1 g/dL (ref 30.0–36.0)
MCV: 101.7 fL — AB (ref 80.0–100.0)
MONOS PCT: 10 %
Monocytes Absolute: 1.1 10*3/uL — ABNORMAL HIGH (ref 0.1–1.0)
NEUTROS PCT: 76 %
NRBC: 0 % (ref 0.0–0.2)
Neutro Abs: 8.1 10*3/uL — ABNORMAL HIGH (ref 1.7–7.7)
PLATELETS: 188 10*3/uL (ref 150–400)
RBC: 3.49 MIL/uL — ABNORMAL LOW (ref 3.87–5.11)
RDW: 13.3 % (ref 11.5–15.5)
WBC: 10.6 10*3/uL — ABNORMAL HIGH (ref 4.0–10.5)

## 2018-10-03 LAB — PHOSPHORUS: PHOSPHORUS: 2.4 mg/dL — AB (ref 2.5–4.6)

## 2018-10-03 LAB — STREP PNEUMONIAE URINARY ANTIGEN: STREP PNEUMO URINARY ANTIGEN: NEGATIVE

## 2018-10-03 LAB — CREATININE, SERUM
CREATININE: 0.94 mg/dL (ref 0.44–1.00)
GFR calc non Af Amer: 57 mL/min — ABNORMAL LOW (ref >60)

## 2018-10-03 LAB — MAGNESIUM: MAGNESIUM: 2.1 mg/dL (ref 1.7–2.4)

## 2018-10-03 MED ORDER — K PHOS MONO-SOD PHOS DI & MONO 155-852-130 MG PO TABS
500.0000 mg | ORAL_TABLET | Freq: Two times a day (BID) | ORAL | Status: AC
  Administered 2018-10-03 (×2): 500 mg via ORAL
  Filled 2018-10-03 (×3): qty 2

## 2018-10-03 MED ORDER — SODIUM CHLORIDE 3 % IN NEBU
4.0000 mL | INHALATION_SOLUTION | Freq: Two times a day (BID) | RESPIRATORY_TRACT | Status: AC
  Administered 2018-10-03 – 2018-10-05 (×5): 4 mL via RESPIRATORY_TRACT
  Filled 2018-10-03 (×7): qty 4

## 2018-10-03 NOTE — Progress Notes (Signed)
PROGRESS NOTE    Katie Johnson  ZOX:[id number] DOB: April 19, 1936 DOA: 09/30/2018 PCP: Pearline Cables, MD  Brief Narrative:  HPI on 09/30/2018 by Dr. Ulla Gallo Burkeis a 82 y.o.femalewith medical history significant forhypertension, hyperlipidemia, hearing loss,who presented to [facility] ED with complaints of worsening productive cough and poor appetite of 3-daysduration. Recently returned from a trip to [place] 1 week ago. Denies chest pain or dyspnea at rest. Admits to close contact exposure to flu. Uponpresentation to the ED patient is hypoxic and febrile with T-max of 102.8, tachypneicwith respiration rate 28. Chest x-ray revealed right middle lobe infiltrate with suspicion for community-acquired pneumonia. TRH asked to admit.  **Antibiotics broadened and sepsis physiology improved.  Patient still remains on significant amount of oxygen via nasal cannula.  Pulmonary involved and appreciate further recommendations.  Assessment & Plan:   Active Problems:   Essential hypertension   Sepsis (HCC)   Acute respiratory failure with hypoxia (HCC)   CAP (community acquired pneumonia)   Hyponatremia   CKD (chronic kidney disease) stage 3, GFR 30-59 ml/min (HCC)   Generalized weakness   Hypophosphatemia   Abnormal LFTs   Macrocytic anemia  Acute respiratory failure with hypoxia in the setting of community-acquired pneumonia -Patient admitted to stepdown unit and continues to wear supplemental oxygen via nasal cannula -Continuous pulse oximetry and maintain O2 saturation greater than 92% -Continue supplemental oxygen via nasal cannula and wean O2 as tolerated -Repeat chest x-ray in a.m. -Treatment as below  Sepsis secondary to community-acquired pneumonia -Patient presented with tachypnea, fever -Chest x-ray reviewed which noted infiltrates in the right middle lobe -CT chest: Dense geographic consolidation with peripheral air bronchograms right lower lobe.  Mild patchy  tree-in-bud opacity in the peripheral right middle lobe.  No discrete lung masses or central airway stenosis.  Findings most compatible with multilobar pneumonia.  Repeat x-ray in 4 to 6 weeks. -Influenza PCR and respiratory viral panel negative -Continue nebulizer treatments, Mucinex, flutter valve -Patient continued to spike fevers with increased WBC -Was transitioned from Azithromycin and ceftriaxone to more broader coverage with vancomycin and cefepime -Obtain MRSA swab and Negative so IV Vancomycin D/C'd -Strep pneumo antigen was negative -PCT was <0.10 -Will continue with Mucinex 1200 mg p.o. twice daily, duo nebs 3 mils nebulized 3 times daily along with 3 mils every 2 PRN for wheezing shortness of breath -Continue with hypertonic saline nebs 4 mils nebulized twice daily for 3 days -Per pulmonary wean oxygen saturations for greater than 92% -Tinea with flutter valve and incentive spirometer -Patient's WBC is improving and went from 13.2 -> 10.6 -Repeat chest x-ray in a.m.  Essential Hypertension -Continue Amlodipine 5 mg po Daily   Hyponatremia -Resolved with IVF and currently getting NS at 50 mL/hr as fluids were reduced as she is now eating -Sodium currently 136 (was 132 on admission)  Chronic kidney disease, stage III -Creatinine stable -Patient's BUN/Cr improved and went from 10/1.14 and is now 10/0.94 -Continue with gentle IV fluid hydration with 50 mL's per hour and then DC in a.m. -Nephrotoxic medications possible -Repeat CMP in a.m.  Generalized Weakness -Likely secondary to pneumonia, will consult PT  Hypophosphatemia -Patient's phosphorus level this morning was 2.4 -Replete with p.o. K-Phos Neutral 500 mg p.o. twice daily x2 doses -Continue monitor replete as necessary -Repeat phosphorus level in a.m.  Abnormal LFTs -Mildly elevated as AST 63 and ALT was 45 -Continue monitor and if continues to worsen we will obtain right upper quadrant ultrasound and  acute hepatitis panel -Repeat CMP in a.m.  Macrocytic Anemia -Likely in the setting of chronic kidney disease -Patient's hemoglobin/hematocrit went from 12.7/39.9 -> 11.4/35.5 -Continue to monitor for signs of bleeding -Check anemia panel in a.m. -Repeat CBC in a.m.  DVT prophylaxis: Enoxaparin 40 mg sq q24h Code Status: FULL CODE Family Communication: Discussed with Husband at bedside Disposition Plan: Remain stepdown and transferred to the medical floor if stable tomorrow  Consultants:   PCCM/Pulmonary    Procedures: None   Antimicrobials:  Anti-infectives (From admission, onward)   Start     Dose/Rate Route Frequency Ordered Stop   10/03/18 1000  vancomycin (VANCOCIN) IVPB 1000 mg/200 mL premix  Status:  Discontinued     1,000 mg 200 mL/hr over 60 Minutes Intravenous Every 24 hours 10/02/18 1138 10/03/18 0850   10/02/18 1000  ceFEPIme (MAXIPIME) 1 g in sodium chloride 0.9 % 100 mL IVPB     1 g 200 mL/hr over 30 Minutes Intravenous Every 12 hours 10/02/18 0938     10/02/18 1000  vancomycin (VANCOCIN) 1,500 mg in sodium chloride 0.9 % 500 mL IVPB     1,500 mg 250 mL/hr over 120 Minutes Intravenous  Once 10/02/18 0940 10/02/18 1345   10/01/18 1200  azithromycin (ZITHROMAX) 500 mg in sodium chloride 0.9 % 250 mL IVPB  Status:  Discontinued     500 mg 250 mL/hr over 60 Minutes Intravenous Every 24 hours 09/30/18 1319 10/02/18 0938   10/01/18 1100  cefTRIAXone (ROCEPHIN) 1 g in sodium chloride 0.9 % 100 mL IVPB  Status:  Discontinued     1 g 200 mL/hr over 30 Minutes Intravenous Every 24 hours 09/30/18 1319 10/02/18 0938   09/30/18 1015  cefTRIAXone (ROCEPHIN) 1 g in sodium chloride 0.9 % 100 mL IVPB     1 g 200 mL/hr over 30 Minutes Intravenous  Once 09/30/18 1012 09/30/18 1209   09/30/18 1015  azithromycin (ZITHROMAX) 500 mg in sodium chloride 0.9 % 250 mL IVPB     500 mg 250 mL/hr over 60 Minutes Intravenous  Once 09/30/18 1012 09/30/18 1137   09/30/18 1014   azithromycin (ZITHROMAX) 500 MG injection    Note to Pharmacy:  Vernell Leeparbone, Estrellita : cabinet override      09/30/18 1014 09/30/18 2229     Subjective: Seen and examined at bedside she is eating breakfast.  Denying chest pain, lightheadedness and states shortness of breath slightly better.  No nausea or vomiting.  Feels okay.  Objective: Vitals:   10/03/18 1000 10/03/18 1100 10/03/18 1127 10/03/18 1200  BP: (!) 153/64 127/65    Pulse: 92 86 77   Resp: (!) 22 (!) 25 19   Temp:    99.7 F (37.6 C)  TempSrc:    Oral  SpO2: 92% 95% 93%   Weight:      Height:        Intake/Output Summary (Last 24 hours) at 10/03/2018 1249 Last data filed at 10/03/2018 0800 Gross per 24 hour  Intake 3321.76 ml  Output 500 ml  Net 2821.76 ml   Filed Weights   09/30/18 0915  Weight: 73 kg   Examination: Physical Exam:  Constitutional: WN/WD overweight Caucasian female currently in NAD and appears calm and comfortable Eyes: Llids and conjunctivae normal, sclerae anicteric  ENMT: External Ears, Nose appear normal. Grossly normal hearing. Neck: Appears normal, supple, no cervical masses, normal ROM, no appreciable thyromegaly; no JVD Respiratory: Diminished to auscultation bilaterally and has coarse breath sounds. Has mild wheezing  and rhonchi on the right. Normal respiratory effort and patient is not tachypenic. No accessory muscle use. Wearing Supplemental O2 via [place] Cardiovascular: RRR, no murmurs / rubs / gallops. S1 and S2 auscultated. Trace extremity edema.  Abdomen: Soft, non-tender, non-distended. No masses palpated. No appreciable hepatosplenomegaly. Bowel sounds positive x4.  GU: Deferred. Musculoskeletal: No clubbing / cyanosis of digits/nails. Normal strength and muscle tone.  Skin: No rashes, lesions, ulcers on a limited skin evaluation. No induration; Warm and dry.  Neurologic: CN 2-12 grossly intact with no focal deficits. Romberg sign and cerebellar reflexes not assessed.    Psychiatric: Normal judgment and insight. Alert and oriented x 2. Slightly anxious mood and appropriate affect.   Data Reviewed: I have personally reviewed following labs and imaging studies  CBC: Recent Labs  Lab 09/30/18 0941 10/01/18 0541 10/02/18 0607 10/03/18 0748  WBC 11.9* 12.1* 13.2* 10.6*  NEUTROABS 9.0*  --   --  8.1*  HGB 13.2 12.6 12.7 11.4*  HCT 41.0 40.5 39.9 35.5*  MCV 100.7* 106.3* 104.5* 101.7*  PLT 195 152 165 188   Basic Metabolic Panel: Recent Labs  Lab 09/30/18 0941 10/01/18 0541 10/03/18 0307 10/03/18 0748  NA 132* 137  --  136  K 3.7 4.5  --  3.6  CL 96* 106  --  105  CO2 23 22  --  23  GLUCOSE 153* 135*  --  133*  BUN 17 15  --  10  CREATININE 1.14* 1.00 0.94 0.94  CALCIUM 9.0 8.3*  --  8.1*  MG  --   --   --  2.1  PHOS  --   --   --  2.4*   GFR: Estimated Creatinine Clearance: 48.8 mL/min (by C-G formula based on SCr of 0.94 mg/dL). Liver Function Tests: Recent Labs  Lab 09/30/18 0941 10/03/18 0748  AST 21 63*  ALT 18 45*  ALKPHOS 61 56  BILITOT 0.8 0.7  PROT 8.4* 6.6  ALBUMIN 4.1 2.8*   No results for input(s): LIPASE, AMYLASE in the last 168 hours. No results for input(s): AMMONIA in the last 168 hours. Coagulation Profile: No results for input(s): INR, PROTIME in the last 168 hours. Cardiac Enzymes: No results for input(s): CKTOTAL, CKMB, CKMBINDEX, TROPONINI in the last 168 hours. BNP (last 3 results) No results for input(s): PROBNP in the last 8760 hours. HbA1C: No results for input(s): HGBA1C in the last 72 hours. CBG: No results for input(s): GLUCAP in the last 168 hours. Lipid Profile: No results for input(s): CHOL, HDL, LDLCALC, TRIG, CHOLHDL, LDLDIRECT in the last 72 hours. Thyroid Function Tests: No results for input(s): TSH, T4TOTAL, FREET4, T3FREE, THYROIDAB in the last 72 hours. Anemia Panel: No results for input(s): VITAMINB12, FOLATE, FERRITIN, TIBC, IRON, RETICCTPCT in the last 72 hours. Sepsis  Labs: Recent Labs  Lab 09/30/18 0946 09/30/18 1605  PROCALCITON  --  <0.10  LATICACIDVEN 1.58  --     Recent Results (from the past 240 hour(s))  Blood culture (routine x 2)     Status: None (Preliminary result)   Collection Time: 09/30/18  9:30 AM  Result Value Ref Range Status   Specimen Description   Final    BLOOD LEFT ARM Performed at [facility], [address] [place], [place] [address]    Special Requests   Final    BOTTLES DRAWN AEROBIC AND ANAEROBIC Blood Culture adequate volume Performed at [facility], [address] [place], [place]  [address]    Culture   Final    NO GROWTH 3 DAYS Performed at [facility], [address], [place], [place] [address]    Report Status PENDING  Incomplete  Blood culture (routine x 2)     Status: None (Preliminary result)   Collection Time: 09/30/18  9:36 AM  Result Value Ref Range Status   Specimen Description   Final    BLOOD LEFT HAND Performed at [facility], [address] [place], [place] [address]    Special Requests   Final    BOTTLES DRAWN AEROBIC AND ANAEROBIC Blood Culture adequate volume Performed at [facility], [address] [place], [place] [address]    Culture   Final    NO GROWTH 3 DAYS Performed at [facility], [address], [place], [place] [address]    Report Status PENDING  Incomplete  Respiratory Panel by PCR     Status: None   Collection Time: 10/01/18  9:10 AM  Result Value Ref Range Status   Adenovirus NOT DETECTED NOT DETECTED Final   Coronavirus 229E NOT DETECTED NOT DETECTED Final   Coronavirus HKU1 NOT DETECTED NOT DETECTED Final   Coronavirus NL63 NOT DETECTED NOT DETECTED Final   Coronavirus OC43 NOT DETECTED NOT DETECTED Final   Metapneumovirus NOT DETECTED NOT DETECTED Final   Rhinovirus / Enterovirus NOT DETECTED NOT DETECTED Final   Influenza A NOT DETECTED NOT DETECTED Final   Influenza B NOT DETECTED NOT DETECTED Final    Parainfluenza Virus 1 NOT DETECTED NOT DETECTED Final   Parainfluenza Virus 2 NOT DETECTED NOT DETECTED Final   Parainfluenza Virus 3 NOT DETECTED NOT DETECTED Final   Parainfluenza Virus 4 NOT DETECTED NOT DETECTED Final   Respiratory Syncytial Virus NOT DETECTED NOT DETECTED Final   Bordetella pertussis NOT DETECTED NOT DETECTED Final   Chlamydophila pneumoniae NOT DETECTED NOT DETECTED Final   Mycoplasma pneumoniae NOT DETECTED NOT DETECTED Final    Comment: Performed at [facility], [address] [place], [place] [address]  MRSA PCR Screening     Status: None   Collection Time: 10/02/18 11:33 AM  Result Value Ref Range Status   MRSA by PCR NEGATIVE NEGATIVE Final    Comment:        The GeneXpert MRSA Assay (FDA approved for NASAL specimens only), is one component of a comprehensive MRSA colonization surveillance program. It is not intended to diagnose MRSA infection nor to guide or monitor treatment for MRSA infections. Performed at [facility], [address] [place], [place] [address]     Radiology Studies: Dg Chest Port 1 View  Result Date: 10/03/2018 CLINICAL DATA:  Short of breath EXAM: PORTABLE CHEST 1 VIEW COMPARISON:  09/30/2018 FINDINGS: Progression of bibasilar airspace disease right greater than left. No significant effusion. Negative for pulmonary edema. IMPRESSION: Progression of bibasilar airspace disease right greater than left. Probable pneumonia. Electronically Signed   By: Marlan Palau M.D.   On: 10/03/2018 10:58    Scheduled Meds: . amLODipine  5 mg Oral Daily  . cholecalciferol  2,000 Units Oral Daily  . enoxaparin (LOVENOX) injection  40 mg Subcutaneous Q24H  . guaiFENesin  1,200 mg Oral BID  . ipratropium-albuterol  3 mL Nebulization TID  . mouth rinse  15 mL Mouth Rinse BID  . phosphorus  500 mg Oral BID  . sodium chloride HYPERTONIC  4 mL Nebulization BID  Continuous Infusions: . sodium chloride 50 mL/hr at  10/03/18 0915  . ceFEPime (MAXIPIME) IV Stopped (10/03/18 1016)    LOS: 3 days   Merlene Laughter, DO [facility] PAGER is on AMION  If 7PM-7AM, please contact night-coverage [email or web address] Password [facility] 10/03/2018, 12:49 PM

## 2018-10-03 NOTE — Care Management Important Message (Signed)
Important Message  Patient Details  Name: Katie Johnson MRN: [id number] Date of Birth: 06/03/1936   Medicare Important Message Given:  Yes    Ellowyn Rieves 10/03/2018, 10:02 AM

## 2018-10-03 NOTE — Care Management Note (Signed)
Case Management Note  Patient Details  Name: Zola Buttonnn B Ebey MRN: [id number] Date of Birth: 01/25/1936  Subjective/Objective:                  82 year old lady who came in with complaints of productive cough, poor appetite about 3 days duration She recently returned from a trip to [place] about a week ago She received shingles shot about a week ago She stated she was not feeling poorly until after shot She had a fever Currently being managed for pneumonia   Action/Plan: Following for progression of care. Following for cm needs, none present at this time, no discharge plans at this time.  Expected Discharge Date:  10/02/18               Expected Discharge Plan:  Home/Self Care  In-House Referral:     Discharge planning Services  CM Consult  Post Acute Care Choice:    Choice offered to:     DME Arranged:    DME Agency:     HH Arranged:    HH Agency:     Status of Service:  In process, will continue to follow  If discussed at Long Length of Stay Meetings, dates discussed:    Additional Comments:  Golda AcreDavis, Caylie Sandquist Lynn, RN 10/03/2018, 9:21 AM

## 2018-10-03 NOTE — Progress Notes (Signed)
   NAME:  Zola Buttonnn B Lamison, MRN:  [id number], DOB:  12/24/1935, LOS: 3 ADMISSION DATE:  09/30/2018, CONSULTATION DATE:  10/03/18 REFERRING MD:  Catha GosselinMikhail, CHIEF COMPLAINT: Shortness of breath  Brief History   82 year old lady who came in with complaints of productive cough, poor appetite about 3 days duration She recently returned from a trip to [place] about a week ago, admitted with community-acquired pneumonia and respiratory failure  Past Medical History  She is usually very healthy Hypertension which is well controlled  Significant Hospital Events   11/25: Progressive shortness of breath with increased oxygen requirement 11/27 feeling better, white blood cell count down, appetite returning. Consults:  Pulmonary 10/02/2018  Procedures:  None  Significant Diagnostic Tests:  CT scan of the chest showing extensive infiltrate at the right base  Micro Data:   Respiratory viral panel negative MRSA by PCR negative   Antimicrobials:  Vancomycin 11/26, stopped 11/27 Cefepime 11/26>> Ceftriaxone 11/24-25 Azithromycin 11/24-25  Interim history/subjective:  Feels better  Objective   Blood pressure (Abnormal) 153/62, pulse 87, temperature 98.8 F (37.1 C), temperature source Oral, resp. rate (Abnormal) 28, height 5\' 6"  (1.676 m), weight 73 kg, SpO2 94 %.        Intake/Output Summary (Last 24 hours) at 10/03/2018 0916 Last data filed at 10/03/2018 0800 Gross per 24 hour  Intake 3441.76 ml  Output 500 ml  Net 2941.76 ml   Filed Weights   09/30/18 0915  Weight: 73 kg    Examination: General: This is a pleasant 82 year old white female currently resting in bed she is in no acute distress HEENT normocephalic atraumatic no jugular venous distention mucous membranes are moist Pulmonary: Yes on right no accessory use Cardiac: Regular rate and rhythm without murmur rub or gallop Abdomen: Soft nontender no organomegaly Extremities: Warm dry no edema Neuro: Awake oriented GU: Clear  yellow  Assessment & Plan:  Sepsis secondary to community-acquired pneumonia Acute hypoxic respiratory failure  Multilobar pneumonia Hypertension Hyponatremia Chronic kidney disease  Acute hypoxic respiratory failure in the setting of multi lobar community-acquired pneumonia (no organism specified to date) -Clinically improving Plan Continuing broad-spectrum antibiotics, today's day #4 antibiotics, currently on cefepime Check urinary strep and Legionella antigen Mobilize Continue flutter valve Add hypertonic saline Wean oxygen for saturations greater than 92% Repeat imaging on 11/28  Best practice:  Diet: As ordered as tolerated DVT prophylaxis: Lovenox Mobility: As tolerated Code Status: Full code Family Communication: No family at bedside Disposition:    Simonne MartinetPeter E Dolton Shaker ACNP-BC [facility] Pager # [phone] OR # [phone] if no answer

## 2018-10-04 ENCOUNTER — Inpatient Hospital Stay (HOSPITAL_COMMUNITY): Payer: Medicare Other

## 2018-10-04 DIAGNOSIS — J189 Pneumonia, unspecified organism: Secondary | ICD-10-CM

## 2018-10-04 LAB — CBC WITH DIFFERENTIAL/PLATELET
ABS IMMATURE GRANULOCYTES: 0.1 10*3/uL — AB (ref 0.00–0.07)
BASOS PCT: 0 %
Basophils Absolute: 0 10*3/uL (ref 0.0–0.1)
EOS ABS: 0.2 10*3/uL (ref 0.0–0.5)
Eosinophils Relative: 2 %
HCT: 32 % — ABNORMAL LOW (ref 36.0–46.0)
Hemoglobin: 10.5 g/dL — ABNORMAL LOW (ref 12.0–15.0)
Immature Granulocytes: 1 %
Lymphocytes Relative: 16 %
Lymphs Abs: 1.5 10*3/uL (ref 0.7–4.0)
MCH: 33.2 pg (ref 26.0–34.0)
MCHC: 32.8 g/dL (ref 30.0–36.0)
MCV: 101.3 fL — AB (ref 80.0–100.0)
MONO ABS: 1.1 10*3/uL — AB (ref 0.1–1.0)
MONOS PCT: 11 %
Neutro Abs: 6.9 10*3/uL (ref 1.7–7.7)
Neutrophils Relative %: 70 %
PLATELETS: 184 10*3/uL (ref 150–400)
RBC: 3.16 MIL/uL — AB (ref 3.87–5.11)
RDW: 13.2 % (ref 11.5–15.5)
WBC: 9.8 10*3/uL (ref 4.0–10.5)
nRBC: 0 % (ref 0.0–0.2)

## 2018-10-04 LAB — COMPREHENSIVE METABOLIC PANEL
ALK PHOS: 54 U/L (ref 38–126)
ALT: 61 U/L — ABNORMAL HIGH (ref 0–44)
ANION GAP: 7 (ref 5–15)
AST: 67 U/L — ABNORMAL HIGH (ref 15–41)
Albumin: 2.6 g/dL — ABNORMAL LOW (ref 3.5–5.0)
BUN: 10 mg/dL (ref 8–23)
CALCIUM: 7.9 mg/dL — AB (ref 8.9–10.3)
CO2: 23 mmol/L (ref 22–32)
Chloride: 108 mmol/L (ref 98–111)
Creatinine, Ser: 0.74 mg/dL (ref 0.44–1.00)
GFR calc non Af Amer: >60 mL/min (ref >60)
GLUCOSE: 140 mg/dL — AB (ref 70–99)
Potassium: 3.1 mmol/L — ABNORMAL LOW (ref 3.5–5.1)
SODIUM: 138 mmol/L (ref 135–145)
TOTAL PROTEIN: 5.9 g/dL — AB (ref 6.5–8.1)
Total Bilirubin: 0.8 mg/dL (ref 0.3–1.2)

## 2018-10-04 LAB — MAGNESIUM: Magnesium: 2.1 mg/dL (ref 1.7–2.4)

## 2018-10-04 LAB — PHOSPHORUS: Phosphorus: 3 mg/dL (ref 2.5–4.6)

## 2018-10-04 MED ORDER — LEVOFLOXACIN 500 MG PO TABS
500.0000 mg | ORAL_TABLET | Freq: Every day | ORAL | Status: DC
  Administered 2018-10-04 – 2018-10-06 (×3): 500 mg via ORAL
  Filled 2018-10-04 (×3): qty 1

## 2018-10-04 MED ORDER — POTASSIUM CHLORIDE CRYS ER 20 MEQ PO TBCR
40.0000 meq | EXTENDED_RELEASE_TABLET | Freq: Two times a day (BID) | ORAL | Status: AC
  Administered 2018-10-04 (×2): 40 meq via ORAL
  Filled 2018-10-04 (×2): qty 2

## 2018-10-04 MED ORDER — FUROSEMIDE 10 MG/ML IJ SOLN
40.0000 mg | Freq: Once | INTRAMUSCULAR | Status: DC
  Filled 2018-10-04: qty 4

## 2018-10-04 NOTE — Progress Notes (Signed)
PROGRESS NOTE    Katie Johnson  WUJ:[id number] DOB: February 15, 1936 DOA: 09/30/2018 PCP: Pearline Cables, MD  Brief Narrative:  HPI on 09/30/2018 by Dr. Ulla Gallo Burkeis a 82 y.o.femalewith medical history significant forhypertension, hyperlipidemia, hearing loss,who presented to [facility] ED with complaints of worsening productive cough and poor appetite of 3-daysduration. Recently returned from a trip to [place] 1 week ago. Denies chest pain or dyspnea at rest. Admits to close contact exposure to flu. Uponpresentation to the ED patient is hypoxic and febrile with T-max of 102.8, tachypneicwith respiration rate 28. Chest x-ray revealed right middle lobe infiltrate with suspicion for community-acquired pneumonia. TRH asked to admit.  **Antibiotics broadened and sepsis physiology improved and has now been transitioned to po Levofloxacin  Patient still remains on significant amount of oxygen via nasal cannula but weaning and currently down to 4 Liters.  X-ray this morning shows some more vascular congestion so IV fluids were stopped and patient was given a dose of IV Lasix with patient refused IV Lasix at this time.  Pulmonary involved and appreciate further recommendations.  Assessment & Plan:   Active Problems:   Essential hypertension   Sepsis (HCC)   Acute respiratory failure with hypoxia (HCC)   CAP (community acquired pneumonia)   Hyponatremia   CKD (chronic kidney disease) stage 3, GFR 30-59 ml/min (HCC)   Generalized weakness   Hypophosphatemia   Abnormal LFTs   Macrocytic anemia  Acute respiratory failure with hypoxia in the setting of community-acquired pneumonia, improving -Patient admitted to stepdown unit and continues to wear supplemental oxygen via nasal cannula.  She is now stable to be transferred to the medical floor with telemetry -Continuous pulse oximetry and maintain O2 saturation greater than 92% -Continue supplemental oxygen via nasal cannula and wean O2  as tolerated -Repeat chest x-ray this morning showed persistent opacity of the right hilum likely pneumonia along with increased opacity left lung base which was another pneumonia. -Chest x-ray also suggested bilateral pulmonary edema which is slightly increased from yesterday's chest x-ray suggesting a mild volume overload to patient's IV fluids were stopped -We will try a dose of IV Lasix but nursing reports patient has refused Lasix today -Treatment as below  Sepsis secondary to community-acquired pneumonia -Patient presented with tachypnea, fever -Chest x-ray reviewed which noted infiltrates in the right middle lobe and right hilum along with left lung base -CT chest: Dense geographic consolidation with peripheral air bronchograms right lower lobe.  Mild patchy tree-in-bud opacity in the peripheral right middle lobe.  No discrete lung masses or central airway stenosis.  Findings most compatible with multilobar pneumonia.  Repeat x-ray in 4 to 6 weeks. -Influenza PCR and respiratory viral panel negative -Continue nebulizer treatments, Mucinex, flutter valve -Patient continued to spike fevers with increased WBC so antibiotics were transitioned from Azithromycin and Ceftriaxone to more broader coverage with vancomycin and cefepime -Obtain MRSA swab and Negative so IV Vancomycin D/C'd; IV cefepime was transitioned to p.o. Levaquin today -Strep pneumo antigen was negative -PCT was <0.10 -Will continue with Mucinex 1200 mg p.o. twice daily, duo nebs 3 mils nebulized 3 times daily along with 3 mils every 2 PRN for wheezing shortness of breath -Continued with hypertonic saline nebs 4 mils nebulized twice daily for 3 days -Per pulmonary wean oxygen saturations for greater than 92% -Continue with flutter valve and incentive spirometer -Patient's WBC is improving and went from 13.2 -> 10.6 -> 9.8 -Repeat chest x-ray in a.m.  Essential Hypertension -Continue Amlodipine  5 mg po Daily    Hyponatremia -Resolved with IVF and was getting NS at 50 mL/hr as fluids were reduced as she is now eating but this is now been discontinued given her mild volume overload on chest x-ray -Sodium currently 138 (was 132 on admission)  Chronic kidney disease, stage III -Creatinine stable -Patient's BUN/Cr improved and went from 10/1.14 and is now 10/0.94 -> 10/0.74 -Gentle IV fluid hydration with 50 mL's per hour DC'd this a.m. and patient was given a dose of IV Lasix but patient has refused this -Nephrotoxic medications possible -Repeat CMP in a.m.  Generalized Weakness -Likely secondary to pneumonia, will consult PT to Evaluate and Treat   Hypokalemia -Patient's potassium this morning was 3.1 -Replete with p.o. potassium chloride 40 equivalents p.o. twice daily x2 doses -Continue to monitor and replete as necessary -Repeat CMP in a.m.  Hypophosphatemia, improved -Patient's phosphorus level was 2.4 and improved to 3.0 -Replete with p.o. K-Phos Neutral 500 mg p.o. twice daily x2 doses yesterday -Continue monitor replete as necessary -Repeat phosphorus level in a.m.  Abnormal LFTs -Likely related to infection and sepsis -Mildly elevated as AST 63 and ALT was 45 today and today AST was 67 and ALT was 61 -We will obtain a right upper quadrant ultrasound along with a hepatitis panel -Repeat CMP in a.m.  Macrocytic Anemia -Likely in the setting of chronic kidney disease -Patient's hemoglobin/hematocrit went from 12.7/39.9 -> 11.4/35.5 -> 10.5/32.0 -Continue to monitor for signs of bleeding -Check anemia panel in a.m. -Repeat CBC in a.m.  DVT prophylaxis: Enoxaparin 40 mg sq q24h Code Status: FULL CODE Family Communication: Discussed with Husband at bedside Disposition Plan: Transfer to the medical floor with telemetry  Consultants:   PCCM/Pulmonary    Procedures: None   Antimicrobials:  Anti-infectives (From admission, onward)   Start     Dose/Rate Route Frequency  Ordered Stop   10/04/18 1000  levofloxacin (LEVAQUIN) tablet 500 mg     500 mg Oral Daily 10/04/18 0936 10/09/18 0959   10/03/18 1000  vancomycin (VANCOCIN) IVPB 1000 mg/200 mL premix  Status:  Discontinued     1,000 mg 200 mL/hr over 60 Minutes Intravenous Every 24 hours 10/02/18 1138 10/03/18 0850   10/02/18 1000  ceFEPIme (MAXIPIME) 1 g in sodium chloride 0.9 % 100 mL IVPB  Status:  Discontinued     1 g 200 mL/hr over 30 Minutes Intravenous Every 12 hours 10/02/18 0938 10/04/18 0936   10/02/18 1000  vancomycin (VANCOCIN) 1,500 mg in sodium chloride 0.9 % 500 mL IVPB     1,500 mg 250 mL/hr over 120 Minutes Intravenous  Once 10/02/18 0940 10/02/18 1345   10/01/18 1200  azithromycin (ZITHROMAX) 500 mg in sodium chloride 0.9 % 250 mL IVPB  Status:  Discontinued     500 mg 250 mL/hr over 60 Minutes Intravenous Every 24 hours 09/30/18 1319 10/02/18 0938   10/01/18 1100  cefTRIAXone (ROCEPHIN) 1 g in sodium chloride 0.9 % 100 mL IVPB  Status:  Discontinued     1 g 200 mL/hr over 30 Minutes Intravenous Every 24 hours 09/30/18 1319 10/02/18 0938   09/30/18 1015  cefTRIAXone (ROCEPHIN) 1 g in sodium chloride 0.9 % 100 mL IVPB     1 g 200 mL/hr over 30 Minutes Intravenous  Once 09/30/18 1012 09/30/18 1209   09/30/18 1015  azithromycin (ZITHROMAX) 500 mg in sodium chloride 0.9 % 250 mL IVPB     500 mg 250 mL/hr over 60 Minutes Intravenous  Once 09/30/18 1012 09/30/18 1137   09/30/18 1014  azithromycin (ZITHROMAX) 500 MG injection    Note to Pharmacy:  Vernell Leeparbone, Estrellita : cabinet override      09/30/18 1014 09/30/18 2229     Subjective: Seen and examined at bedside patient is doing better.  Denied any chest pain, lightheadedness or dizziness.  No nausea or vomiting.  Oxygen requirement is weaning.  Refused her right upper quadrant ultrasound this morning but then was agreeable to it after eating breakfast.  Also refused her Lasix dose this morning after explained to her that she has some  congestion noted on her chest x-ray.  No other concerns or complaints at this time  Objective: Vitals:   10/04/18 0403 10/04/18 0600 10/04/18 0711 10/04/18 0800  BP: (!) 153/72 (!) 147/87  (!) 158/82  Pulse: 86 100  85  Resp: (!) 25 (!) 25  (!) 21  Temp: 98 F (36.7 C)   98.1 F (36.7 C)  TempSrc: Oral   Oral  SpO2: 90% 91% 92% 90%  Weight:      Height:        Intake/Output Summary (Last 24 hours) at 10/04/2018 1147 Last data filed at 10/04/2018 1000 Gross per 24 hour  Intake 1049.63 ml  Output 1550 ml  Net -500.37 ml   Filed Weights   09/30/18 0915  Weight: 73 kg   Examination: Physical Exam:  Constitutional: Well-nourished, well-developed overweight Caucasian female currently no acute distress appears calm and comfortable Eyes: Lids and conjunctive are normal.  Sclera anicteric ENMT: External ears and nose appear normal.  Grossly normal hearing Neck: Appears supple no JVD Respiratory: Diminished to auscultation bilaterally with coarse breath sounds.  Has some rhonchi on the right and some mild rhonchi on the left.  Normal respiratory effort and she is not tachypneic but is wearing supplemental oxygen via nasal cannula Cardiovascular: Regular rate and rhythm.  No appreciable murmurs, rubs, gallops.  Has some mild lower extremity edema noted today. Abdomen: Soft, nontender, nondistended.  Bowel sounds present all 4 quadrants GU: Deferred Musculoskeletal: No contractures or cyanosis noted.  No joint deformities noted Skin: Skin is warm and dry no appreciable rashes or lesions on limited skin evaluation Neurologic: Cranial nerves II through XII grossly intact no appreciable focal deficits. Psychiatric: Awake and alert and oriented.  Normal judgment and insight.  Not as anxious today and seems improved.  Is alert and oriented x3  Data Reviewed: I have personally reviewed following labs and imaging studies  CBC: Recent Labs  Lab 09/30/18 0941 10/01/18 0541 10/02/18 0607  10/03/18 0748 10/04/18 0307  WBC 11.9* 12.1* 13.2* 10.6* 9.8  NEUTROABS 9.0*  --   --  8.1* 6.9  HGB 13.2 12.6 12.7 11.4* 10.5*  HCT 41.0 40.5 39.9 35.5* 32.0*  MCV 100.7* 106.3* 104.5* 101.7* 101.3*  PLT 195 152 165 188 184   Basic Metabolic Panel: Recent Labs  Lab 09/30/18 0941 10/01/18 0541 10/03/18 0307 10/03/18 0748 10/04/18 0307  NA 132* 137  --  136 138  K 3.7 4.5  --  3.6 3.1*  CL 96* 106  --  105 108  CO2 23 22  --  23 23  GLUCOSE 153* 135*  --  133* 140*  BUN 17 15  --  10 10  CREATININE 1.14* 1.00 0.94 0.94 0.74  CALCIUM 9.0 8.3*  --  8.1* 7.9*  MG  --   --   --  2.1 2.1  PHOS  --   --   --  2.4* 3.0   GFR: Estimated Creatinine Clearance: 57.4 mL/min (by C-G formula based on SCr of 0.74 mg/dL). Liver Function Tests: Recent Labs  Lab 09/30/18 0941 10/03/18 0748 10/04/18 0307  AST 21 63* 67*  ALT 18 45* 61*  ALKPHOS 61 56 54  BILITOT 0.8 0.7 0.8  PROT 8.4* 6.6 5.9*  ALBUMIN 4.1 2.8* 2.6*   No results for input(s): LIPASE, AMYLASE in the last 168 hours. No results for input(s): AMMONIA in the last 168 hours. Coagulation Profile: No results for input(s): INR, PROTIME in the last 168 hours. Cardiac Enzymes: No results for input(s): CKTOTAL, CKMB, CKMBINDEX, TROPONINI in the last 168 hours. BNP (last 3 results) No results for input(s): PROBNP in the last 8760 hours. HbA1C: No results for input(s): HGBA1C in the last 72 hours. CBG: No results for input(s): GLUCAP in the last 168 hours. Lipid Profile: No results for input(s): CHOL, HDL, LDLCALC, TRIG, CHOLHDL, LDLDIRECT in the last 72 hours. Thyroid Function Tests: No results for input(s): TSH, T4TOTAL, FREET4, T3FREE, THYROIDAB in the last 72 hours. Anemia Panel: No results for input(s): VITAMINB12, FOLATE, FERRITIN, TIBC, IRON, RETICCTPCT in the last 72 hours. Sepsis Labs: Recent Labs  Lab 09/30/18 0946 09/30/18 1605  PROCALCITON  --  <0.10  LATICACIDVEN 1.58  --     Recent Results (from the  past 240 hour(s))  Blood culture (routine x 2)     Status: None (Preliminary result)   Collection Time: 09/30/18  9:30 AM  Result Value Ref Range Status   Specimen Description   Final    BLOOD LEFT ARM Performed at [facility], [address] [place], [place] [address]    Special Requests   Final    BOTTLES DRAWN AEROBIC AND ANAEROBIC Blood Culture adequate volume Performed at [facility], [address], [place], [place] [address]    Culture   Final    NO GROWTH 4 DAYS Performed at [facility], [address], [place], [place] [address]    Report Status PENDING  Incomplete  Blood culture (routine x 2)     Status: None (Preliminary result)   Collection Time: 09/30/18  9:36 AM  Result Value Ref Range Status   Specimen Description   Final    BLOOD LEFT HAND Performed at [facility], [address] [place], [place] [address]    Special Requests   Final    BOTTLES DRAWN AEROBIC AND ANAEROBIC Blood Culture adequate volume Performed at [facility], [address] [place], [place] [address]    Culture   Final    NO GROWTH 4 DAYS Performed at [facility], [address] [place], [place] [address]    Report Status PENDING  Incomplete  Respiratory Panel by PCR     Status: None   Collection Time: 10/01/18  9:10 AM  Result Value Ref Range Status   Adenovirus NOT DETECTED NOT DETECTED Final   Coronavirus 229E NOT DETECTED NOT DETECTED Final   Coronavirus HKU1 NOT DETECTED NOT DETECTED Final   Coronavirus NL63 NOT DETECTED NOT DETECTED Final   Coronavirus OC43 NOT DETECTED NOT DETECTED Final   Metapneumovirus NOT DETECTED NOT DETECTED Final   Rhinovirus / Enterovirus NOT DETECTED NOT DETECTED Final   Influenza A NOT DETECTED NOT DETECTED Final   Influenza B NOT DETECTED NOT DETECTED Final   Parainfluenza Virus 1 NOT DETECTED NOT DETECTED Final   Parainfluenza Virus  2 NOT DETECTED NOT DETECTED Final   Parainfluenza  Virus 3 NOT DETECTED NOT DETECTED Final   Parainfluenza Virus 4 NOT DETECTED NOT DETECTED Final   Respiratory Syncytial Virus NOT DETECTED NOT DETECTED Final   Bordetella pertussis NOT DETECTED NOT DETECTED Final   Chlamydophila pneumoniae NOT DETECTED NOT DETECTED Final   Mycoplasma pneumoniae NOT DETECTED NOT DETECTED Final    Comment: Performed at [facility], [address] [place], [place] [address]  MRSA PCR Screening     Status: None   Collection Time: 10/02/18 11:33 AM  Result Value Ref Range Status   MRSA by PCR NEGATIVE NEGATIVE Final    Comment:        The GeneXpert MRSA Assay (FDA approved for NASAL specimens only), is one component of a comprehensive MRSA colonization surveillance program. It is not intended to diagnose MRSA infection nor to guide or monitor treatment for MRSA infections. Performed at [facility], [address] [place], [place] [address]    Radiology Studies: Dg Chest Port 1 View  Result Date: 10/04/2018 CLINICAL DATA:  Shortness of breath EXAM: PORTABLE CHEST 1 VIEW COMPARISON:  Chest x-rays dated 10/03/2018, 09/30/2018 and 04/08/2013. FINDINGS: Persistent opacity at the RIGHT hilum, compatible with the pneumonia demonstrated on chest CT. Increased opacity at the LEFT lung base, additional pneumonia versus atelectasis/airspace collapse. Bilateral perihilar and bibasilar pulmonary edema, slightly increased compared to the most recent chest x-ray of 10/03/2018. No pneumothorax seen. Osseous structures about the chest are unremarkable. IMPRESSION: 1. Persistent opacity at the RIGHT hilum, compatible with the pneumonia demonstrated on chest CT. 2. Increased opacity at the LEFT lung base, additional pneumonia versus atelectasis/airspace collapse. 3. Bilateral perihilar and bibasilar pulmonary edema, slightly increased compared to the most recent chest x-ray of 10/03/2018, suggesting worsened fluid status. Electronically Signed   By:  Bary Richard M.D.   On: 10/04/2018 07:10   Dg Chest Port 1 View  Result Date: 10/03/2018 CLINICAL DATA:  Short of breath EXAM: PORTABLE CHEST 1 VIEW COMPARISON:  09/30/2018 FINDINGS: Progression of bibasilar airspace disease right greater than left. No significant effusion. Negative for pulmonary edema. IMPRESSION: Progression of bibasilar airspace disease right greater than left. Probable pneumonia. Electronically Signed   By: Marlan Palau M.D.   On: 10/03/2018 10:58    Scheduled Meds: . amLODipine  5 mg Oral Daily  . cholecalciferol  2,000 Units Oral Daily  . enoxaparin (LOVENOX) injection  40 mg Subcutaneous Q24H  . furosemide  40 mg Intravenous Once  . guaiFENesin  1,200 mg Oral BID  . ipratropium-albuterol  3 mL Nebulization TID  . levofloxacin  500 mg Oral Daily  . mouth rinse  15 mL Mouth Rinse BID  . potassium chloride  40 mEq Oral BID WC  . sodium chloride HYPERTONIC  4 mL Nebulization BID   Continuous Infusions:   LOS: 4 days   Merlene Laughter, DO [facility] PAGER is on AMION  If 7PM-7AM, please contact night-coverage [email or web address] Password [facility] 10/04/2018, 11:47 AM

## 2018-10-04 NOTE — Progress Notes (Addendum)
   NAME:  Katie Johnson, MRN:  [id number], DOB:  06/01/1936, LOS: 4 ADMISSION DATE:  09/30/2018, CONSULTATION DATE:  10/03/18 REFERRING MD:  Catha GosselinMikhail, CHIEF COMPLAINT: Shortness of breath  Brief History   82 year old lady who came in with complaints of productive cough, poor appetite about 3 days duration She recently returned from a trip to [place] about a week ago, admitted with community-acquired pneumonia and respiratory failure  Past Medical History  She is usually very healthy Hypertension which is well controlled  Significant Hospital Events   11/25: Progressive shortness of breath with increased oxygen requirement 11/27 feeling better, white blood cell count down, appetite returning. Consults:  Pulmonary 10/02/2018  Procedures:  None  Significant Diagnostic Tests:  CT scan of the chest showing extensive infiltrate at the right base Chest x-ray today showing some congestion  Micro Data:   Respiratory viral panel negative MRSA by PCR negative   Antimicrobials:  Vancomycin 11/26, stopped 11/27 Cefepime 11/26>>11/28 Ceftriaxone 11/24-25 Azithromycin 11/24-25 Levaquin 11/28>>  Interim history/subjective:  Feels better With oxygen requirement improving Still with a cough  Objective   Blood pressure (!) 158/82, pulse 85, temperature 98.1 F (36.7 C), temperature source Oral, resp. rate (!) 21, height 5\' 6"  (1.676 m), weight 73 kg, SpO2 90 %.        Intake/Output Summary (Last 24 hours) at 10/04/2018 0936 Last data filed at 10/04/2018 0616 Gross per 24 hour  Intake 937.77 ml  Output 1550 ml  Net -612.23 ml   Filed Weights   09/30/18 0915  Weight: 73 kg    Examination: General: Elderly lady, not in distress HEENT moist oral mucosa Pulmonary: Rales at the bases bilaterally Cardiac: S1-S2 appreciated Abdomen: Bowel sounds appreciated, no organomegaly, no tenderness Extremities: Warm and dry Neuro: Awake oriented   Chest x-ray reviewed by myself showing  increased pulmonary vascular congestion, atelectasis, persistence right lower lobe infiltrate  Assessment & Plan:  Sepsis secondary to community-acquired pneumonia -Clinically improving -Leukocytosis improved -Will switch to oral antibiotics-cultures have been negative, start on Levaquin p.o.  Acute hypoxemic respiratory failure -Secondary to multilobar infiltrate/pneumonia -Continue to wean oxygen down as tolerated -Oxygen requirement is improving  Hypertension -Controlled  Hyponatremia -Resolved  Elevated liver enzymes -Ultrasound of the liver ordered -May be related to recent sepsis  Continue flutter device use Continue pulmonary toileting Wean off oxygen supplementation   Increased congestion on chest x-ray -She will receive a dose of Lasix today    Best practice:  Diet: As ordered as tolerated DVT prophylaxis: Lovenox Mobility: As tolerated Code Status: Full code Family Communication: Spouse updated at bedside Disposition:   Virl DiamondAdewale Ajwa Kimberley Cell: [phone]

## 2018-10-05 ENCOUNTER — Inpatient Hospital Stay (HOSPITAL_COMMUNITY): Payer: Medicare Other

## 2018-10-05 ENCOUNTER — Telehealth: Payer: Self-pay | Admitting: Family Medicine

## 2018-10-05 LAB — CBC WITH DIFFERENTIAL/PLATELET
Abs Immature Granulocytes: 0.19 10*3/uL — ABNORMAL HIGH (ref 0.00–0.07)
BASOS ABS: 0.1 10*3/uL (ref 0.0–0.1)
Basophils Relative: 1 %
EOS PCT: 4 %
Eosinophils Absolute: 0.4 10*3/uL (ref 0.0–0.5)
HCT: 34.2 % — ABNORMAL LOW (ref 36.0–46.0)
Hemoglobin: 11.3 g/dL — ABNORMAL LOW (ref 12.0–15.0)
Immature Granulocytes: 2 %
LYMPHS PCT: 17 %
Lymphs Abs: 1.7 10*3/uL (ref 0.7–4.0)
MCH: 33.1 pg (ref 26.0–34.0)
MCHC: 33 g/dL (ref 30.0–36.0)
MCV: 100.3 fL — ABNORMAL HIGH (ref 80.0–100.0)
MONO ABS: 1 10*3/uL (ref 0.1–1.0)
Monocytes Relative: 10 %
NEUTROS ABS: 6.6 10*3/uL (ref 1.7–7.7)
Neutrophils Relative %: 66 %
Platelets: 232 10*3/uL (ref 150–400)
RBC: 3.41 MIL/uL — AB (ref 3.87–5.11)
RDW: 13.2 % (ref 11.5–15.5)
WBC: 9.8 10*3/uL (ref 4.0–10.5)
nRBC: 0 % (ref 0.0–0.2)

## 2018-10-05 LAB — CULTURE, BLOOD (ROUTINE X 2)
CULTURE: NO GROWTH
Culture: NO GROWTH
Special Requests: ADEQUATE
Special Requests: ADEQUATE

## 2018-10-05 LAB — COMPREHENSIVE METABOLIC PANEL
ALBUMIN: 2.8 g/dL — AB (ref 3.5–5.0)
ALT: 79 U/L — ABNORMAL HIGH (ref 0–44)
AST: 66 U/L — ABNORMAL HIGH (ref 15–41)
Alkaline Phosphatase: 70 U/L (ref 38–126)
Anion gap: 8 (ref 5–15)
BUN: 10 mg/dL (ref 8–23)
CO2: 24 mmol/L (ref 22–32)
Calcium: 8.7 mg/dL — ABNORMAL LOW (ref 8.9–10.3)
Chloride: 107 mmol/L (ref 98–111)
Creatinine, Ser: 0.76 mg/dL (ref 0.44–1.00)
GFR calc non Af Amer: >60 mL/min (ref >60)
Glucose, Bld: 118 mg/dL — ABNORMAL HIGH (ref 70–99)
Potassium: 3.7 mmol/L (ref 3.5–5.1)
SODIUM: 139 mmol/L (ref 135–145)
Total Bilirubin: 0.7 mg/dL (ref 0.3–1.2)
Total Protein: 6.8 g/dL (ref 6.5–8.1)

## 2018-10-05 LAB — MAGNESIUM: MAGNESIUM: 2.2 mg/dL (ref 1.7–2.4)

## 2018-10-05 LAB — HEPATITIS PANEL, ACUTE
HEP B C IGM: NEGATIVE
HEP B S AG: NEGATIVE
Hep A IgM: NEGATIVE

## 2018-10-05 LAB — LEGIONELLA PNEUMOPHILA SEROGP 1 UR AG: L. pneumophila Serogp 1 Ur Ag: NEGATIVE

## 2018-10-05 LAB — PHOSPHORUS: PHOSPHORUS: 2.5 mg/dL (ref 2.5–4.6)

## 2018-10-05 MED ORDER — HYDROCORTISONE 1 % EX CREA
TOPICAL_CREAM | Freq: Three times a day (TID) | CUTANEOUS | Status: DC
  Administered 2018-10-05 – 2018-10-06 (×4): via TOPICAL
  Filled 2018-10-05: qty 28

## 2018-10-05 MED ORDER — DIPHENHYDRAMINE HCL 25 MG PO CAPS
25.0000 mg | ORAL_CAPSULE | Freq: Four times a day (QID) | ORAL | Status: DC | PRN
  Administered 2018-10-05: 25 mg via ORAL
  Filled 2018-10-05: qty 1

## 2018-10-05 NOTE — Progress Notes (Signed)
PROGRESS NOTE    Katie Johnson  GEX:[id number] DOB: 1936-04-08 DOA: 09/30/2018 PCP: Pearline Cables, MD  Brief Narrative:  HPI on 09/30/2018 by Dr. Ulla Gallo Burkeis a 82 y.o.femalewith medical history significant forhypertension, hyperlipidemia, hearing loss,who presented to [facility] ED with complaints of worsening productive cough and poor appetite of 3-daysduration. Recently returned from a trip to [place] 1 week ago. Denies chest pain or dyspnea at rest. Admits to close contact exposure to flu. Uponpresentation to the ED patient is hypoxic and febrile with T-max of 102.8, tachypneicwith respiration rate 28. Chest x-ray revealed right middle lobe infiltrate with suspicion for community-acquired pneumonia. TRH asked to admit.  **Antibiotics broadened and sepsis physiology improved and has now been transitioned to po Levofloxacin  Patient still remains on significant amount of oxygen via nasal cannula but weaning and currently down to 4 Liters.  X-ray 11/28 shows some more vascular congestion so IV fluids were stopped and patient was given a dose of IV Lasix with patient refused IV Lasix at that time.  Pulmonary involved and appreciate further recommendations.  Desaturated on ambulation today on room air and needs at least 2 L supplemental oxygen via nasal cannula.  Assessment & Plan:   Active Problems:   Essential hypertension   Sepsis (HCC)   Acute respiratory failure with hypoxia (HCC)   CAP (community acquired pneumonia)   Hyponatremia   CKD (chronic kidney disease) stage 3, GFR 30-59 ml/min (HCC)   Generalized weakness   Hypophosphatemia   Abnormal LFTs   Macrocytic anemia  Acute respiratory failure with hypoxia in the setting of community-acquired pneumonia, improving -Patient admitted to stepdown unit and continues to wear supplemental oxygen via nasal cannula.  She is now stable to be transferred to the medical floor with telemetry -Continuous pulse oximetry and  maintain O2 saturation greater than 92% -Continue supplemental oxygen via nasal cannula and wean O2 as tolerated; Desaturated while talking today. -Chest x-ray 10/04/18 also suggested bilateral pulmonary edema which is slightly increased from yesterday's chest x-ray suggesting a mild volume overload to patient's IV fluids were stopped -We will try a dose of IV Lasix but nursing reports patient has refused Lasix today -Repeat CXR  In AM  -Treatment as below -Did Ambulatory Home Screen and patient desaturated to 87% on Room Air; Will repeat Home O2 screen in AM  -Will require Home O2 but will have to pay out of pocket as insurance will not cover it currently   Sepsis secondary to community-acquired pneumonia -Patient presented with tachypnea, fever and source of infection -Sepsis Physiology is much improved  -Chest x-ray reviewed which noted infiltrates in the right middle lobe and right hilum along with left lung base -CT chest: Dense geographic consolidation with peripheral air bronchograms right lower lobe.  Mild patchy tree-in-bud opacity in the peripheral right middle lobe.  No discrete lung masses or central airway stenosis.  Findings most compatible with multilobar pneumonia.  Repeat x-ray in 4 to 6 weeks. -Influenza PCR and respiratory viral panel negative -Continue nebulizer treatments, Mucinex, flutter valve -Patient continued to spike fevers with increased WBC so antibiotics were transitioned from Azithromycin and Ceftriaxone to more broader coverage with vancomycin and cefepime -Obtained MRSA swab and Negative so IV Vancomycin D/C'd; IV cefepime was transitioned to p.o. Levaquin on 10/04/18 for 5 more days  -Strep pneumo antigen was negative and Urine Legionella was Negative  -PCT was <0.10 -Will continue with Mucinex 1200 mg p.o. twice daily, duo nebs 3 mils nebulized  3 times daily along with 3 mils every 2 PRN for wheezing shortness of breath -Continued with hypertonic saline nebs 4  mils nebulized twice daily for 3 days -Per pulmonary wean oxygen saturations for greater than 92% -Continue with flutter valve and incentive spirometer -Patient's WBC is improving and went from 13.2 -> 10.6 -> 9.8 -Repeat chest x-ray in a.m.  Essential Hypertension -Continue Amlodipine 5 mg po Daily   Hyponatremia -Resolved with IVF and was getting NS at 50 mL/hr; this is now been discontinued given her mild volume overload on chest x-ray -Sodium currently 139 (was 132 on admission)  Chronic kidney disease, stage III -Creatinine stable -Patient's BUN/Cr improved and now 10/0.76 -Gentle IV fluid hydration with 50 mL's per hour DC'd this a.m. and patient was given a dose of IV Lasix but patient has refused this -Nephrotoxic medications possible -Repeat CMP in a.m.  Generalized Weakness -Likely secondary to pneumonia, will consult PT to Evaluate and Treat and recommended Home Health PT  Hypokalemia -Improved. Patient's potassium this morning was 3.7 -Continue to monitor and replete as necessary -Repeat CMP in a.m.  Hypophosphatemia, improved -Patient's phosphorus level was 2.4 and improved to 3.0 and is now 2.5 -Continue monitor replete as necessary -Repeat phosphorus level in a.m.  Abnormal LFTs -Likely related to infection and sepsis -Mildly elevated as AST 63 and ALT was 45 today and today AST was 67 and ALT was 61 -Acute hepatitis panel negative -RUQ U/S showed extrahepatic biliary dilatation the common bile duct being 8 mm without any accompanying intrahepatic dilatation or identification of gallstones.  -Patient did not want further work-up including MRCP and states that she will do this as an outpatient -Repeat CMP in a.m.  Macrocytic Anemia -Likely in the setting of chronic kidney disease -Patient's hemoglobin/hematocrit stable at 11.3/34.2 -Continue to monitor for signs of bleeding -Check anemia panel in a.m. -Repeat CBC in a.m.  DVT prophylaxis: Enoxaparin  40 mg sq q24h Code Status: FULL CODE Family Communication: Discussed with Husband at bedside Disposition Plan: Anticipate D/C Home in the Next 24-48 hours if medically stable   Consultants:   PCCM/Pulmonary    Procedures: None   Antimicrobials:  Anti-infectives (From admission, onward)   Start     Dose/Rate Route Frequency Ordered Stop   10/04/18 1000  levofloxacin (LEVAQUIN) tablet 500 mg     500 mg Oral Daily 10/04/18 0936 10/09/18 0959   10/03/18 1000  vancomycin (VANCOCIN) IVPB 1000 mg/200 mL premix  Status:  Discontinued     1,000 mg 200 mL/hr over 60 Minutes Intravenous Every 24 hours 10/02/18 1138 10/03/18 0850   10/02/18 1000  ceFEPIme (MAXIPIME) 1 g in sodium chloride 0.9 % 100 mL IVPB  Status:  Discontinued     1 g 200 mL/hr over 30 Minutes Intravenous Every 12 hours 10/02/18 0938 10/04/18 0936   10/02/18 1000  vancomycin (VANCOCIN) 1,500 mg in sodium chloride 0.9 % 500 mL IVPB     1,500 mg 250 mL/hr over 120 Minutes Intravenous  Once 10/02/18 0940 10/02/18 1345   10/01/18 1200  azithromycin (ZITHROMAX) 500 mg in sodium chloride 0.9 % 250 mL IVPB  Status:  Discontinued     500 mg 250 mL/hr over 60 Minutes Intravenous Every 24 hours 09/30/18 1319 10/02/18 0938   10/01/18 1100  cefTRIAXone (ROCEPHIN) 1 g in sodium chloride 0.9 % 100 mL IVPB  Status:  Discontinued     1 g 200 mL/hr over 30 Minutes Intravenous Every 24 hours 09/30/18 1319 10/02/18  [id number]   09/30/18 1015  cefTRIAXone (ROCEPHIN) 1 g in sodium chloride 0.9 % 100 mL IVPB     1 g 200 mL/hr over 30 Minutes Intravenous  Once 09/30/18 1012 09/30/18 1209   09/30/18 1015  azithromycin (ZITHROMAX) 500 mg in sodium chloride 0.9 % 250 mL IVPB     500 mg 250 mL/hr over 60 Minutes Intravenous  Once 09/30/18 1012 09/30/18 1137   09/30/18 1014  azithromycin (ZITHROMAX) 500 MG injection    Note to Pharmacy:  Vernell Leeparbone, Estrellita : cabinet override      09/30/18 1014 09/30/18 2229     Subjective: Seen and examined at  bedside thinks she was doing better is feeling a little bit better.  Still desaturated while talking and home O2 screen showed that she needs oxygen.  Insurance not paying for oxygen as this is acute and not chronic and so patient will likely have to pay out-of-pocket for supplemental oxygen.  She denies any chest pain, headaches or blurred vision.  Patient feels well and likely could be discharged tomorrow if she does not desaturate further but will reassess home O2 screen in a.m.  Objective: Vitals:   10/05/18 0740 10/05/18 0914 10/05/18 1259 10/05/18 1427  BP:  (!) 123/55 136/71   Pulse:  84 81   Resp:   18   Temp:   (!) 97.5 F (36.4 C)   TempSrc:   Oral   SpO2: (!) 88% 93% 91% 91%  Weight:      Height:        Intake/Output Summary (Last 24 hours) at 10/05/2018 1452 Last data filed at 10/05/2018 0445 Gross per 24 hour  Intake 120 ml  Output 803 ml  Net -683 ml   Filed Weights   09/30/18 0915  Weight: 73 kg   Examination: Physical Exam:  Constitutional: Well-nourished, well-developed overweight Caucasian female currently no acute distress appears calm and comfortable sitting in bed resting Eyes: Lids and conjunctive are normal.  Sclera anicteric ENMT: External ears and nose appear normal.  Grossly normal hearing Neck: Appears supple no JVD Respiratory: Diminished to auscultation bilaterally no appreciable wheezing, rales or rhonchi.  Has some coarse breath sounds though.  Has a normal respiratory effort and is not tachypneic and was off oxygen when I saw this morning but when speaking she desaturated and was placed back on 2 L of oxygen. Cardiovascular: Regular rate and rhythm.  No appreciable murmurs, rubs or gallops.  Has trace lower extremity edema Abdomen: Soft, nontender, nondistended.  Bowel sounds present all 4 quadrants GU: Deferred Musculoskeletal: No contractures or cyanosis noted.  No joint deformities noted Skin: Skin is warm and did have a mild back  rash. Neurologic: Cranial nerves II through XII grossly intact with no appreciable focal deficits.  Romberg sign and cerebellar reflexes were not assessed Psychiatric: Awake and alert and oriented.  Normal mood and affect.  Intact judgment and insight.  Data Reviewed: I have personally reviewed following labs and imaging studies  CBC: Recent Labs  Lab 09/30/18 0941 10/01/18 0541 10/02/18 0607 10/03/18 0748 10/04/18 0307 10/05/18 0445  WBC 11.9* 12.1* 13.2* 10.6* 9.8 9.8  NEUTROABS 9.0*  --   --  8.1* 6.9 6.6  HGB 13.2 12.6 12.7 11.4* 10.5* 11.3*  HCT 41.0 40.5 39.9 35.5* 32.0* 34.2*  MCV 100.7* 106.3* 104.5* 101.7* 101.3* 100.3*  PLT 195 152 165 188 184 232   Basic Metabolic Panel: Recent Labs  Lab 09/30/18 0941 10/01/18 0541 10/03/18 0307 10/03/18 [id number]  10/04/18 0307 10/05/18 0445  NA 132* 137  --  136 138 139  K 3.7 4.5  --  3.6 3.1* 3.7  CL 96* 106  --  105 108 107  CO2 23 22  --  23 23 24   GLUCOSE 153* 135*  --  133* 140* 118*  BUN 17 15  --  10 10 10   CREATININE 1.14* 1.00 0.94 0.94 0.74 0.76  CALCIUM 9.0 8.3*  --  8.1* 7.9* 8.7*  MG  --   --   --  2.1 2.1 2.2  PHOS  --   --   --  2.4* 3.0 2.5   GFR: Estimated Creatinine Clearance: 57.4 mL/min (by C-G formula based on SCr of 0.76 mg/dL). Liver Function Tests: Recent Labs  Lab 09/30/18 0941 10/03/18 0748 10/04/18 0307 10/05/18 0445  AST 21 63* 67* 66*  ALT 18 45* 61* 79*  ALKPHOS 61 56 54 70  BILITOT 0.8 0.7 0.8 0.7  PROT 8.4* 6.6 5.9* 6.8  ALBUMIN 4.1 2.8* 2.6* 2.8*   No results for input(s): LIPASE, AMYLASE in the last 168 hours. No results for input(s): AMMONIA in the last 168 hours. Coagulation Profile: No results for input(s): INR, PROTIME in the last 168 hours. Cardiac Enzymes: No results for input(s): CKTOTAL, CKMB, CKMBINDEX, TROPONINI in the last 168 hours. BNP (last 3 results) No results for input(s): PROBNP in the last 8760 hours. HbA1C: No results for input(s): HGBA1C in the last 72  hours. CBG: No results for input(s): GLUCAP in the last 168 hours. Lipid Profile: No results for input(s): CHOL, HDL, LDLCALC, TRIG, CHOLHDL, LDLDIRECT in the last 72 hours. Thyroid Function Tests: No results for input(s): TSH, T4TOTAL, FREET4, T3FREE, THYROIDAB in the last 72 hours. Anemia Panel: No results for input(s): VITAMINB12, FOLATE, FERRITIN, TIBC, IRON, RETICCTPCT in the last 72 hours. Sepsis Labs: Recent Labs  Lab 09/30/18 0946 09/30/18 1605  PROCALCITON  --  <0.10  LATICACIDVEN 1.58  --     Recent Results (from the past 240 hour(s))  Blood culture (routine x 2)     Status: None   Collection Time: 09/30/18  9:30 AM  Result Value Ref Range Status   Specimen Description   Final    BLOOD LEFT ARM Performed at [facility], [address] [place], [place] [address]    Special Requests   Final    BOTTLES DRAWN AEROBIC AND ANAEROBIC Blood Culture adequate volume Performed at [facility], [address], [place], [place] [address]    Culture   Final    NO GROWTH 5 DAYS Performed at [facility], [address], [place], [place] [address]    Report Status 10/05/2018 FINAL  Final  Blood culture (routine x 2)     Status: None   Collection Time: 09/30/18  9:36 AM  Result Value Ref Range Status   Specimen Description   Final    BLOOD LEFT HAND Performed at [facility], [address] [place], [place] [address]    Special Requests   Final    BOTTLES DRAWN AEROBIC AND ANAEROBIC Blood Culture adequate volume Performed at [facility], [address] [place], [place] [address]    Culture   Final    NO GROWTH 5 DAYS Performed at [facility], [address], [place], [place] [address]    Report Status 10/05/2018 FINAL  Final  Respiratory Panel by PCR  Status: None   Collection Time: 10/01/18  9:10 AM  Result Value Ref Range Status   Adenovirus NOT DETECTED NOT DETECTED Final   Coronavirus 229E NOT  DETECTED NOT DETECTED Final   Coronavirus HKU1 NOT DETECTED NOT DETECTED Final   Coronavirus NL63 NOT DETECTED NOT DETECTED Final   Coronavirus OC43 NOT DETECTED NOT DETECTED Final   Metapneumovirus NOT DETECTED NOT DETECTED Final   Rhinovirus / Enterovirus NOT DETECTED NOT DETECTED Final   Influenza A NOT DETECTED NOT DETECTED Final   Influenza B NOT DETECTED NOT DETECTED Final   Parainfluenza Virus 1 NOT DETECTED NOT DETECTED Final   Parainfluenza Virus 2 NOT DETECTED NOT DETECTED Final   Parainfluenza Virus 3 NOT DETECTED NOT DETECTED Final   Parainfluenza Virus 4 NOT DETECTED NOT DETECTED Final   Respiratory Syncytial Virus NOT DETECTED NOT DETECTED Final   Bordetella pertussis NOT DETECTED NOT DETECTED Final   Chlamydophila pneumoniae NOT DETECTED NOT DETECTED Final   Mycoplasma pneumoniae NOT DETECTED NOT DETECTED Final    Comment: Performed at [facility], [address], [place], [place] [address]  MRSA PCR Screening     Status: None   Collection Time: 10/02/18 11:33 AM  Result Value Ref Range Status   MRSA by PCR NEGATIVE NEGATIVE Final    Comment:        The GeneXpert MRSA Assay (FDA approved for NASAL specimens only), is one component of a comprehensive MRSA colonization surveillance program. It is not intended to diagnose MRSA infection nor to guide or monitor treatment for MRSA infections. Performed at [facility], [address] [place], [place] [address]    Radiology Studies: Dg Chest Port 1 View  Result Date: 10/04/2018 CLINICAL DATA:  Shortness of breath EXAM: PORTABLE CHEST 1 VIEW COMPARISON:  Chest x-rays dated 10/03/2018, 09/30/2018 and 04/08/2013. FINDINGS: Persistent opacity at the RIGHT hilum, compatible with the pneumonia demonstrated on chest CT. Increased opacity at the LEFT lung base, additional pneumonia versus atelectasis/airspace collapse. Bilateral perihilar and bibasilar pulmonary edema, slightly increased compared to  the most recent chest x-ray of 10/03/2018. No pneumothorax seen. Osseous structures about the chest are unremarkable. IMPRESSION: 1. Persistent opacity at the RIGHT hilum, compatible with the pneumonia demonstrated on chest CT. 2. Increased opacity at the LEFT lung base, additional pneumonia versus atelectasis/airspace collapse. 3. Bilateral perihilar and bibasilar pulmonary edema, slightly increased compared to the most recent chest x-ray of 10/03/2018, suggesting worsened fluid status. Electronically Signed   By: Bary Richard M.D.   On: 10/04/2018 07:10   US Abdomen Limited Ruq  Result Date: 10/04/2018 CLINICAL DATA:  Abnormal LFTs, history hypertension EXAM: ULTRASOUND ABDOMEN LIMITED RIGHT UPPER QUADRANT COMPARISON:  None FINDINGS: Gallbladder: Normally distended without stones or wall thickening. No pericholecystic fluid or sonographic Murphy sign. Common bile duct: Diameter: 8 mm diameter Liver: Normal hepatic echogenicity. No focal hepatic mass or nodularity. No intrahepatic biliary dilatation. Portal vein is patent on color Doppler imaging with normal direction of blood flow towards the liver. No RIGHT upper quadrant free fluid. IMPRESSION: Extrahepatic biliary dilatation, CBD 8 mm, without accompanying intrahepatic dilatation or identification of gallstones. Recommend correlation with LFTs. If there is clinical concern for distal CBD obstruction, consider MRCP. Electronically Signed   By: Ulyses Southward M.D.   On: 10/04/2018 16:31    Scheduled Meds: . amLODipine  5 mg Oral Daily  . cholecalciferol  2,000 Units Oral Daily  . enoxaparin (LOVENOX) injection  40 mg Subcutaneous Q24H  .  furosemide  40 mg Intravenous Once  . guaiFENesin  1,200 mg Oral BID  . hydrocortisone cream   Topical TID  . ipratropium-albuterol  3 mL Nebulization TID  . levofloxacin  500 mg Oral Daily  . mouth rinse  15 mL Mouth Rinse BID  . sodium chloride HYPERTONIC  4 mL Nebulization BID   Continuous Infusions:   LOS:  5 days   Merlene Laughter, DO [facility] PAGER is on AMION  If 7PM-7AM, please contact night-coverage [email or web address] Password [email or web address] 10/05/2018, 2:52 PM

## 2018-10-05 NOTE — Telephone Encounter (Signed)
Copied from CRM [phone]. Topic: Quick Communication - See Telephone Encounter >> Oct 05, 2018  2:22 PM Angela NevinWilliams, Candice N wrote: CRM for notification. See Telephone encounter for: 10/05/18.  Westley Hummerharlene, PT with [facility] calling to inquire if Dr. Patsy Lageropland is going to follow pts case. Please advise.

## 2018-10-05 NOTE — Progress Notes (Signed)
   NAME:  Katie Johnson, MRN:  [id number], DOB:  02/26/1936, LOS: 5 ADMISSION DATE:  09/30/2018, CONSULTATION DATE:  10/03/18 REFERRING MD:  Katie Johnson, CHIEF COMPLAINT: Shortness of breath  Brief History   82 year old lady who came in with complaints of productive cough, poor appetite about 3 days duration She recently returned from a trip to [place] about a week ago, admitted with community-acquired pneumonia and respiratory failure  Past Medical History  She is usually very healthy Hypertension which is well controlled  Significant Hospital Events   11/25: Progressive shortness of breath with increased oxygen requirement 11/27 feeling better, white blood cell count down, appetite returning. 11/29 oxygen requirement continues to improve Consults:  Pulmonary 10/02/2018  Procedures:  None  Significant Diagnostic Tests:  CT scan of the chest showing extensive infiltrate at the right base Chest x-ray today showing some congestion  Micro Data:   Respiratory viral panel negative MRSA by PCR negative   Antimicrobials:  Vancomycin 11/26, stopped 11/27 Cefepime 11/26>>11/28 Ceftriaxone 11/24-25 Azithromycin 11/24-25 Levaquin 11/28>>  Interim history/subjective:  Feels better With oxygen requirement improving Still with a cough-not coughing up any secretions  Objective   Blood pressure 136/71, pulse 81, temperature (!) 97.5 F (36.4 C), temperature source Oral, resp. rate 18, height 5\' 6"  (1.676 m), weight 73 kg, SpO2 91 %.        Intake/Output Summary (Last 24 hours) at 10/05/2018 1442 Last data filed at 10/05/2018 0445 Gross per 24 hour  Intake 120 ml  Output 803 ml  Net -683 ml   Filed Weights   09/30/18 0915  Weight: 73 kg    Examination: General: Does appear comfortable, HEENT moist oral mucosa Pulmonary: Fair air entry with few rales at the bases Cardiac: S1-S2 appreciated Abdomen: Soft, nontender Extremities: Warm and dry   Chest x-ray reviewed by myself  showing increased pulmonary vascular congestion, atelectasis, persistence right lower lobe infiltrate  Assessment & Plan:  Sepsis secondary to community-acquired pneumonia -Clinically improving -Continue oral Levaquin  Acute hypoxemic respiratory failure -Continue to wean oxygen -Encouraged to ambulate to try and assess oxygen requirement  Hypertension -Controlled  Elevated liver enzymes -Ultrasound of the liver ordered -May be related to recent sepsis -Ultrasound result pending  Continue flutter device use Continue pulmonary toileting Wean off oxygen supplementation Ambulate as tolerated   Best practice:  Diet: As ordered as tolerated DVT prophylaxis: Lovenox Mobility: As tolerated Code Status: Full code Family Communication: Discussed with spouse at bedside Disposition:  I believe this is if she is able to ambulate and not desaturating, then plans may be made for possible discharge in the next 24 to 48 hours  Katie Johnson Cell: [phone]

## 2018-10-05 NOTE — Care Management Note (Signed)
Case Management Note  Patient Details  Name: Zola Buttonnn B Reuss MRN: [id number] Date of Birth: 03/02/1936  Subjective/Objective:                    Action/Plan:   Expected Discharge Date:  10/02/18               Expected Discharge Plan:  Home w Home Health Services(OP PT)  In-House Referral:     Discharge planning Services  CM Consult  Post Acute Care Choice:  Durable Medical Equipment Choice offered to:  Spouse, Patient  DME Arranged:  Oxygen DME Agency:  [facility]  [facility]  [facility]  Status of Service:  Completed, signed off  If discussed at Long Length of Stay Meetings, dates discussed:    Additional CommentsGeni Bers:  Quintasha Gren, RN 10/05/2018, 12:53 PM

## 2018-10-05 NOTE — Progress Notes (Signed)
Occupational Therapy Treatment Patient Details Name: PAULLETTE MCKAIN MRN: [id number] DOB: 1936-09-11 Today's Date: 10/05/2018    History of present illness 82 yo female admitted to ED 11/24 with cough, fever, weakness, and fall. Pt with medical diagnosis of sepsis secondary to suspected CAP, acute hypoxic respiratory failure. Pt reports recently returning from a trip to [place], and all symptoms are new. PMH includes OA, GERD, HTN, AC OA, thoracic pain, CKDIII.    OT comments  Pt ambulated to bathroom for grooming and toileting.  Decreased safety, memory, and slow to initiate at times.  Needed less physical assistance to move.  She will benefit from continued OT in acute setting and at home   Follow Up Recommendations  Supervision/Assistance - 24 hour;Home health OT    Equipment Recommendations  None recommended by OT    Recommendations for Other Services      Precautions / Restrictions Precautions Precautions: Fall Precaution Comments: monitor O2 Restrictions Weight Bearing Restrictions: No       Mobility Bed Mobility Overal bed mobility: Needs Assistance Bed Mobility: Supine to Sit;Sit to Supine     Supine to sit: Supervision;HOB elevated Sit to supine: Supervision;HOB elevated   General bed mobility comments: extra time  Transfers Overall transfer level: Needs assistance Equipment used: Rolling walker (2 wheeled) Transfers: Sit to/from Stand Sit to Stand: Min assist         General transfer comment: assist to rise and steady, cues for hand placement, pt uses rocking technique to self assist, uncontrolled descent requiring assist    Balance Overall balance assessment: Needs assistance   Sitting balance-Leahy Scale: Fair     Standing balance support: Bilateral upper extremity supported Standing balance-Leahy Scale: Poor Standing balance comment: reliant on UEs                           ADL either performed or assessed with clinical judgement   ADL        Grooming: Min guard;Standing;Wash/dry hands;Wash/dry face;Oral care                   Toilet Transfer: Min guard;Ambulation;RW   Toileting- Architect and Hygiene: Min guard;Sit to/from stand         General ADL Comments: ambulated into bathroom for grooming.  Performed above activities.  sats 88-91% on RA. Cued for rest breaks     Vision       Perception     Praxis      Cognition Arousal/Alertness: Awake/alert Behavior During Therapy: WFL for tasks assessed/performed Overall Cognitive Status: Impaired/Different from baseline                       Memory: Decreased short-term memory Following Commands: Follows one step commands consistently Safety/Judgement: Decreased awareness of safety   Problem Solving: Requires verbal cues General Comments: possible short term memory issues?  repeated cues for safety with RW, discussed oxygen requirements a couple times         Exercises     Shoulder Instructions       General Comments      Pertinent Vitals/ Pain       Pain Assessment: No/denies pain  Home Living                                          Prior  Functioning/Environment              Frequency  Min 2X/week        Progress Toward Goals  OT Goals(current goals can now be found in the care plan section)  Progress towards OT goals: Progressing toward goals     Plan      Co-evaluation                 AM-PAC OT "6 Clicks" Daily Activity     Outcome Measure   Help from another person eating meals?: None Help from another person taking care of personal grooming?: A Little Help from another person toileting, which includes using toliet, bedpan, or urinal?: A Little Help from another person bathing (including washing, rinsing, drying)?: A Little Help from another person to put on and taking off regular upper body clothing?: A Little Help from another person to put on and taking off  regular lower body clothing?: A Little 6 Click Score: 19    End of Session    OT Visit Diagnosis: Unsteadiness on feet (R26.81);Muscle weakness (generalized) (M62.81)   Activity Tolerance Patient tolerated treatment well   Patient Left in bed;with call bell/phone within reach;with family/visitor present   Nurse Communication          Time: [id number] OT Time Calculation (min): 34 min  Charges: OT General Charges $OT Visit: 1 Visit OT Treatments $Self Care/Home Management : 23-37 mins  Marica OtterMaryellen Mendy Chou, OTR/L Acute Rehabilitation Services [phone] [facility] pager [phone] office 10/05/2018   Danette Weinfeld 10/05/2018, 3:33 PM

## 2018-10-05 NOTE — Progress Notes (Signed)
Physical Therapy Treatment Patient Details Name: Zola Buttonnn B Hinde MRN: [id number] DOB: 05/01/1936 Today's Date: 10/05/2018  SATURATION QUALIFICATIONS: (This note is used to comply with regulatory documentation for home oxygen)  Patient Saturations on Room Air at Rest = 90%  Patient Saturations on Room Air while Ambulating = 87%  Patient Saturations on 2 Liters of oxygen while Ambulating = 91%  Please briefly explain why patient needs home oxygen: to improve oxygen saturations with physical activity such as ambulation  Chloe Miyoshi,KATHrine E 10/05/2018, 2:21 PM  Zenovia JarredKati Issabella Rix, PT, DPT Acute Rehabilitation Services Office: [phone] Pager: [phone]

## 2018-10-05 NOTE — Progress Notes (Signed)
Physical Therapy Treatment Patient Details Name: Katie Johnson MRN: [id number] DOB: 06/12/1936 Today's Date: 10/05/2018    History of Present Illness 82 yo female admitted to ED 11/24 with cough, fever, weakness, and fall. Pt with medical diagnosis of sepsis secondary to suspected CAP, acute hypoxic respiratory failure. Pt reports recently returning from a trip to [place], and all symptoms are new. PMH includes OA, GERD, HTN, AC OA, thoracic pain, CKDIII.     PT Comments    Pt assisted with ambulating in hallway and requires RW and supplemental oxygen at this time.  Pt requires assist for standing.  Spouse and pt agreeable for HHPT upon d/c.  Recommended pt use RW upon d/c as well for safe mobility.    Follow Up Recommendations  Home health PT;Supervision for mobility/OOB     Equipment Recommendations  Rolling walker with 5" wheels    Recommendations for Other Services       Precautions / Restrictions Precautions Precautions: Fall Precaution Comments: monitor O2    Mobility  Bed Mobility Overal bed mobility: Needs Assistance Bed Mobility: Supine to Sit;Sit to Supine     Supine to sit: Supervision;HOB elevated Sit to supine: Supervision;HOB elevated      Transfers Overall transfer level: Needs assistance Equipment used: Rolling walker (2 wheeled) Transfers: Sit to/from Stand Sit to Stand: Min assist         General transfer comment: assist to rise and steady, cues for hand placement, pt uses rocking technique to self assist, uncontrolled descent requiring assist  Ambulation/Gait Ambulation/Gait assistance: Min guard Gait Distance (Feet): 200 Feet Assistive device: Rolling walker (2 wheeled) Gait Pattern/deviations: Step-through pattern;Decreased stride length;Trunk flexed Gait velocity: decr    General Gait Details: verbal cues for posture and RW positioning, pt required supplemental oxygen (see previous progress note from today)   Stairs              Wheelchair Mobility    Modified Rankin (Stroke Patients Only)       Balance Overall balance assessment: Needs assistance         Standing balance support: Bilateral upper extremity supported Standing balance-Leahy Scale: Poor Standing balance comment: reliant on UEs                            Cognition Arousal/Alertness: Awake/alert Behavior During Therapy: WFL for tasks assessed/performed Overall Cognitive Status: Impaired/Different from baseline                         Following Commands: Follows one step commands consistently Safety/Judgement: Decreased awareness of safety   Problem Solving: Requires verbal cues General Comments: possible short term memory issues?  repeated cues for safety with RW, discussed oxygen requirements a couple times       Exercises      General Comments        Pertinent Vitals/Pain Pain Assessment: No/denies pain    Home Living                      Prior Function            PT Goals (current goals can now be found in the care plan section) Progress towards PT goals: Progressing toward goals    Frequency           PT Plan      Co-evaluation  AM-PAC PT "6 Clicks" Mobility   Outcome Measure  Help needed turning from your back to your side while in a flat bed without using bedrails?: A Little Help needed moving from lying on your back to sitting on the side of a flat bed without using bedrails?: A Little Help needed moving to and from a bed to a chair (including a wheelchair)?: A Little Help needed standing up from a chair using your arms (e.g., wheelchair or bedside chair)?: A Little Help needed to walk in hospital room?: A Little Help needed climbing 3-5 steps with a railing? : A Lot 6 Click Score: 17    End of Session Equipment Utilized During Treatment: Gait belt;Oxygen Activity Tolerance: Patient tolerated treatment well Patient left: in bed;with call  bell/phone within reach;with bed alarm set;with family/visitor present   PT Visit Diagnosis: Difficulty in walking, not elsewhere classified (R26.2);Muscle weakness (generalized) (M62.81)     Time: 1610-9604 PT Time Calculation (min) (ACUTE ONLY): 21 min  Charges:  $Gait Training: 8-22 mins                    Zenovia Jarred, PT, DPT Acute Rehabilitation Services Office: [phone] Pager: [phone]   Sarajane Jews 10/05/2018, 3:32 PM

## 2018-10-06 ENCOUNTER — Inpatient Hospital Stay (HOSPITAL_COMMUNITY): Payer: Medicare Other

## 2018-10-06 DIAGNOSIS — D539 Nutritional anemia, unspecified: Secondary | ICD-10-CM

## 2018-10-06 DIAGNOSIS — E871 Hypo-osmolality and hyponatremia: Secondary | ICD-10-CM

## 2018-10-06 DIAGNOSIS — R945 Abnormal results of liver function studies: Secondary | ICD-10-CM

## 2018-10-06 LAB — COMPREHENSIVE METABOLIC PANEL
ALK PHOS: 69 U/L (ref 38–126)
ALT: 76 U/L — AB (ref 0–44)
AST: 63 U/L — ABNORMAL HIGH (ref 15–41)
Albumin: 3 g/dL — ABNORMAL LOW (ref 3.5–5.0)
Anion gap: 10 (ref 5–15)
BUN: 12 mg/dL (ref 8–23)
CALCIUM: 8.9 mg/dL (ref 8.9–10.3)
CO2: 27 mmol/L (ref 22–32)
CREATININE: 0.7 mg/dL (ref 0.44–1.00)
Chloride: 104 mmol/L (ref 98–111)
GFR calc Af Amer: >60 mL/min (ref >60)
GFR calc non Af Amer: >60 mL/min (ref >60)
Glucose, Bld: 77 mg/dL (ref 70–99)
Potassium: 4 mmol/L (ref 3.5–5.1)
Sodium: 141 mmol/L (ref 135–145)
Total Bilirubin: 0.8 mg/dL (ref 0.3–1.2)
Total Protein: 6.6 g/dL (ref 6.5–8.1)

## 2018-10-06 LAB — RETICULOCYTES
Immature Retic Fract: 23.6 % — ABNORMAL HIGH (ref 2.3–15.9)
RBC.: 3.46 MIL/uL — ABNORMAL LOW (ref 3.87–5.11)
Retic Count, Absolute: 51.6 10*3/uL (ref 19.0–186.0)
Retic Ct Pct: 1.5 % (ref 0.4–3.1)

## 2018-10-06 LAB — CBC WITH DIFFERENTIAL/PLATELET
Abs Immature Granulocytes: 0.21 10*3/uL — ABNORMAL HIGH (ref 0.00–0.07)
Basophils Absolute: 0.1 10*3/uL (ref 0.0–0.1)
Basophils Relative: 1 %
Eosinophils Absolute: 0.5 10*3/uL (ref 0.0–0.5)
Eosinophils Relative: 6 %
HCT: 34.8 % — ABNORMAL LOW (ref 36.0–46.0)
Hemoglobin: 11.2 g/dL — ABNORMAL LOW (ref 12.0–15.0)
Immature Granulocytes: 3 %
LYMPHS ABS: 2.1 10*3/uL (ref 0.7–4.0)
Lymphocytes Relative: 25 %
MCH: 32.4 pg (ref 26.0–34.0)
MCHC: 32.2 g/dL (ref 30.0–36.0)
MCV: 100.6 fL — ABNORMAL HIGH (ref 80.0–100.0)
MONO ABS: 0.8 10*3/uL (ref 0.1–1.0)
Monocytes Relative: 10 %
NEUTROS ABS: 4.5 10*3/uL (ref 1.7–7.7)
Neutrophils Relative %: 55 %
Platelets: 325 10*3/uL (ref 150–400)
RBC: 3.46 MIL/uL — ABNORMAL LOW (ref 3.87–5.11)
RDW: 13.2 % (ref 11.5–15.5)
WBC: 8.2 10*3/uL (ref 4.0–10.5)
nRBC: 0 % (ref 0.0–0.2)

## 2018-10-06 LAB — FOLATE: Folate: >22.4 ng/mL (ref >5.9)

## 2018-10-06 LAB — IRON AND TIBC
Iron: 47 ug/dL (ref 28–170)
Saturation Ratios: 20 % (ref 10.4–31.8)
TIBC: 231 ug/dL — ABNORMAL LOW (ref 250–450)
UIBC: 184 ug/dL

## 2018-10-06 LAB — PHOSPHORUS: Phosphorus: 3.8 mg/dL (ref 2.5–4.6)

## 2018-10-06 LAB — MAGNESIUM: Magnesium: 2.3 mg/dL (ref 1.7–2.4)

## 2018-10-06 LAB — VITAMIN B12: Vitamin B-12: 1314 pg/mL — ABNORMAL HIGH (ref 180–914)

## 2018-10-06 LAB — FERRITIN: FERRITIN: 564 ng/mL — AB (ref 11–307)

## 2018-10-06 MED ORDER — SACCHAROMYCES BOULARDII 250 MG PO CAPS
250.0000 mg | ORAL_CAPSULE | Freq: Two times a day (BID) | ORAL | Status: DC
  Administered 2018-10-06: 250 mg via ORAL
  Filled 2018-10-06: qty 1

## 2018-10-06 MED ORDER — GUAIFENESIN ER 600 MG PO TB12: 1200.0000 mg | ORAL_TABLET | Freq: Two times a day (BID) | ORAL | 0 refills | Status: DC

## 2018-10-06 MED ORDER — LEVOFLOXACIN 500 MG PO TABS: 500.0000 mg | ORAL_TABLET | Freq: Every day | ORAL | 0 refills | Status: DC

## 2018-10-06 NOTE — Discharge Summary (Signed)
Physician Discharge Summary  Katie Johnson:[id number] DOB: [birth year] DOA: 09/30/2018  PCP: Pearline Cables, MD  Admit date: 09/30/2018 Discharge date: 10/06/2018  Time spent: 45 minutes  Recommendations for Outpatient Follow-up:  Patient will be discharged to home with home health physical and occupational therapy.  Patient will need to follow up with primary care provider within one week of discharge, repeat CBC and CMP.  Follow up with gastroenterology. Patient should continue medications as prescribed.  Patient should follow a heart healthy diet.   Discharge Diagnoses:  Acute respiratory failure with hypoxia in the setting of community-acquired pneumonia Sepsis secondary to community-acquired pneumonia Essential Hypertension Hyponatremia Chronic kidney disease, stage III Generalized Weakness Hypokalemia Hypophosphatemia Abnormal LFTs Macrocytic Anemia  Discharge Condition: stable  Diet recommendation: Heart healthy  Filed Weights   09/30/18 0915  Weight: 73 kg    History of present illness:  on 09/30/2018 by Dr. Ulla Gallo Burkeis a 82 y.o.femalewith medical history significant forhypertension, hyperlipidemia, hearing loss,who presented to [facility] ED with complaints of worsening productive cough and poor appetite of 3-daysduration. Recently returned from a trip to [place] 1 week ago. Denies chest pain or dyspnea at rest. Admits to close contact exposure to flu. Uponpresentation to the ED patient is hypoxic and febrile with T-max of 102.8, tachypneicwith respiration rate 28. Chest x-ray revealed right middle lobe infiltrate with suspicion for community-acquired pneumonia. TRH asked to admit.  Hospital Course:  Acute respiratory failure with hypoxia in the setting of community-acquired pneumonia -Patient was admitted to stepdown and required high flow oxygen  -PCCM consulted and appreciated  -Chest x-ray 10/04/18 suggested bilateral pulmonary edema  which is slightly increased from previous chest x-ray suggesting a mild volume overload, IVF was discontinued and she was given some IV lasix -Able to ambulate today without oxygen desaturation -CXR reviewed today and shows improvement  Sepsis secondary to community-acquired pneumonia -Patient presented with tachypnea, fever and source of infection -Sepsis Physiology is much improved  -Chest x-ray reviewed which noted infiltrates in the right middle lobe and right hilum along with left lung base -CT chest:Dense geographic consolidation with peripheral air bronchograms right lower lobe. Mild patchy tree-in-bud opacity in the peripheral right middle lobe. No discrete lung masses or central airway stenosis. Findings most compatible with multilobar pneumonia. Repeat x-ray in 4 to 6 weeks. -Influenza PCRand respiratory viral panelnegative -was placed on nebulizer treatments, Mucinex, flutter valve -She continued to spike fevers, and was transitioned from  Azithromycin and Ceftriaxone to more broader coverage with vancomycin and cefepime -patient improved and was transitioned to levaquin on 10/04/2018, and will be discharged with additional 2 doses  -Strep pneumo antigen was negative and Urine Legionella was Negative   Essential Hypertension -Continue Amlodipine   Hyponatremia -Resolved with IVF and was getting NS at 50 mL/hr; this is now been discontinued given her mild volume overload on chest x-ray -Sodium currently 139 (was 132 on admission)  Chronic kidney disease, stage III -Creatinine stable  Generalized Weakness -Likely secondary to pneumonia -PT recommended HH  Hypokalemia -resolved   Hypophosphatemia, improved -resolved   Abnormal LFTs -Likely related to infection and sepsis -Mildly elevated -Acute hepatitis panel negative -RUQ U/S showed extrahepatic biliary dilatation the common bile duct being 8 mm without any accompanying intrahepatic dilatation or  identification of gallstones.  -Patient did not want further work-up including MRCP and states that she will do this as an outpatient -Follow up with outpatient GI -repeat CMP in one week  Macrocytic Anemia -Likely  in the setting of chronic kidney disease -Patient's hemoglobin/hematocrit stable at 11.3/34.2  Procedures: None  Consultations: PCCM  Discharge Exam: Vitals:   10/05/18 2036 10/06/18 0454  BP: (!) 147/82 (!) 153/85  Pulse: 83 83  Resp: 18 20  Temp: 98.7 F (37.1 C) 97.8 F (36.6 C)  SpO2: 90% 95%   Patient has no complaints today.  Would like to go home today.  Denies current chest pain, abdominal pain, nausea vomiting, diarrhea or constipation.  Feels her breathing is almost back to normal.   General: Well developed, well nourished, NAD, appears stated age  HEENT: NCAT, mucous membranes moist.  Neck: Suppe  Cardiovascular: S1 S2 auscultated, no rubs, murmurs or gallops. Regular rate and rhythm.  Respiratory: Diminished but clear, occ cough  Abdomen: Soft, nontender, nondistended, + bowel sounds  Extremities: warm dry without cyanosis clubbing or edema  Neuro: AAOx3, nonfocal  Psych: Normal affect and demeanor with intact judgement and insight  Discharge Instructions Discharge Instructions    Discharge instructions   Complete by:  As directed    Patient will be discharged to home with home health physical and occupational therapy.  Patient will need to follow up with primary care provider within one week of discharge, repeat CBC and CMP.  Follow up with gastroenterology. Patient should continue medications as prescribed.  Patient should follow a heart healthy diet.   Recommend eating yogurt (such as Activia) or taking probiotics.     Allergies as of 10/06/2018      Reactions   Shellfish-derived Products Anaphylaxis   Statins Diarrhea, Nausea And Vomiting   Pravastatin Diarrhea   Ketoprofen Rash      Medication List    TAKE these  medications   amLODipine-benazepril 10-20 MG capsule Commonly known as:  LOTREL Take 1 capsule by mouth daily.   CENTRUM ADULTS PO Take by mouth.   cholecalciferol 1000 units tablet Commonly known as:  VITAMIN D Take 2,000 Units by mouth daily.   diclofenac sodium 1 % Gel Commonly known as:  VOLTAREN Apply 2 g topically 4 (four) times daily.   guaiFENesin 600 MG 12 hr tablet Commonly known as:  MUCINEX Take 2 tablets (1,200 mg total) by mouth 2 (two) times daily.   levofloxacin 500 MG tablet Commonly known as:  LEVAQUIN Take 1 tablet (500 mg total) by mouth daily. Start taking on:  10/07/2018            Durable Medical Equipment  (From admission, onward)         Start     Ordered   10/06/18 1026  For home use only DME Walker rolling  [facility](Walkers)  Once    Question:  Patient needs a walker to treat with the following condition  Answer:  Generalized weakness   10/06/18 1025   10/05/18 1623  For home use only DME oxygen  Once    Question Answer Comment  Mode or (Route) Nasal cannula   Liters per Minute 2   Frequency Continuous (stationary and portable oxygen unit needed)   Oxygen delivery system Gas      10/05/18 1623   10/05/18 1247  For home use only DME Nebulizer machine  Once    Question:  Patient needs a nebulizer to treat with the following condition  Answer:  Respiratory abnormalities   10/05/18 1248         Allergies  Allergen Reactions  . Shellfish-Derived Products Anaphylaxis  . Statins Diarrhea and Nausea And Vomiting  . Pravastatin  Diarrhea  . Ketoprofen Rash   Follow-up Information    Copland, Gwenlyn Found, MD. Schedule an appointment as soon as possible for a visit in 1 week(s).   Specialty:  Family Medicine Why:  Hospital follow up Contact information: [address] [place] [address] [phone]            The results of significant diagnostics from this hospitalization (including imaging, microbiology, ancillary  and laboratory) are listed below for reference.    Significant Diagnostic Studies: Dg Chest 2 View  Result Date: 09/30/2018 CLINICAL DATA:  Cough, fever, body aches, generalized weakness for 3 days, return from [place] 1 week ago, history hypertension, GERD EXAM: CHEST - 2 VIEW COMPARISON:  04/08/2013 FINDINGS: Normal heart size, mediastinal contours, and pulmonary vascularity. Opacity identified in the posterior RIGHT mid lung, probably representing pneumonia though requiring followup until resolution to exclude underlying mass. Remaining lungs clear. No pleural effusion or pneumothorax. Diffuse osseous demineralization. IMPRESSION: RIGHT mid lung opacity posteriorly likely representing pneumonia. However, underlying mass/tumor is not excluded and radiographic follow-up until resolution is required to exclude underlying tumor. Electronically Signed   By: Ulyses Southward M.D.   On: 09/30/2018 10:26   Ct Chest Wo Contrast  Result Date: 10/01/2018 CLINICAL DATA:  Inpatient. Acute hypoxic respiratory failure. Right mid lung opacity on chest radiograph. EXAM: CT CHEST WITHOUT CONTRAST TECHNIQUE: Multidetector CT imaging of the chest was performed following the standard protocol without IV contrast. COMPARISON:  Chest radiograph from earlier today. FINDINGS: Cardiovascular: Normal heart size. No significant pericardial effusion/thickening. Left anterior descending coronary atherosclerosis. Atherosclerotic nonaneurysmal thoracic aorta. Normal caliber pulmonary arteries. Mediastinum/Nodes: No discrete thyroid nodules. Unremarkable esophagus. No pathologically enlarged axillary, mediastinal or hilar lymph nodes, noting limited sensitivity for the detection of hilar adenopathy on this noncontrast study. Lungs/Pleura: No pneumothorax. No pleural effusion. No central airway stenoses. There is dense geographic consolidation in the right lower lobe with a few air bronchograms at the periphery and with surrounding patchy  ground-glass opacity. There is mild patchy tree-in-bud opacity and ground-glass opacity in the peripheral right middle lobe. Solid 4 mm peripheral right lower lobe pulmonary nodule (series 10/image 76). No discrete lung masses or additional significant pulmonary nodules. Upper abdomen: No acute abnormality. Musculoskeletal: No aggressive appearing focal osseous lesions. Moderate thoracic spondylosis. IMPRESSION: 1. Dense geographic consolidation with peripheral air bronchograms in the right lower lobe. Mild patchy tree-in-bud opacity in the peripheral right middle lobe. No discrete lung masses or central airway stenoses. Findings are most compatible with multilobar pneumonia. Follow-up chest radiograph recommended in 4-6 weeks to document clearing. 2. One vessel coronary atherosclerosis. Aortic Atherosclerosis (ICD10-I70.0). Electronically Signed   By: Delbert Phenix M.D.   On: 10/01/2018 08:09   Dg Chest Port 1 View  Result Date: 10/06/2018 CLINICAL DATA:  Shortness of breath. EXAM: PORTABLE CHEST 1 VIEW COMPARISON:  Radiograph 10/04/2018.  Chest CT 09/30/2018. FINDINGS: Improving lung aeration from prior exam with improving bibasilar opacities. Streaky right perihilar airspace disease persists. Mild cardiomegaly with unchanged mediastinal contours. Improving pulmonary edema from prior exam. Suspect left pleural effusion. IMPRESSION: Improving radiographic appearance of the chest with improving bibasilar opacities and improving pulmonary edema. Persistent but improving right perihilar pneumonia. Electronically Signed   By: Narda Rutherford M.D.   On: 10/06/2018 05:59   Dg Chest Port 1 View  Result Date: 10/04/2018 CLINICAL DATA:  Shortness of breath EXAM: PORTABLE CHEST 1 VIEW COMPARISON:  Chest x-rays dated 10/03/2018, 09/30/2018 and 04/08/2013. FINDINGS: Persistent opacity  at the RIGHT hilum, compatible with the pneumonia demonstrated on chest CT. Increased opacity at the LEFT lung base, additional  pneumonia versus atelectasis/airspace collapse. Bilateral perihilar and bibasilar pulmonary edema, slightly increased compared to the most recent chest x-ray of 10/03/2018. No pneumothorax seen. Osseous structures about the chest are unremarkable. IMPRESSION: 1. Persistent opacity at the RIGHT hilum, compatible with the pneumonia demonstrated on chest CT. 2. Increased opacity at the LEFT lung base, additional pneumonia versus atelectasis/airspace collapse. 3. Bilateral perihilar and bibasilar pulmonary edema, slightly increased compared to the most recent chest x-ray of 10/03/2018, suggesting worsened fluid status. Electronically Signed   By: Bary Richard M.D.   On: 10/04/2018 07:10   Dg Chest Port 1 View  Result Date: 10/03/2018 CLINICAL DATA:  Short of breath EXAM: PORTABLE CHEST 1 VIEW COMPARISON:  09/30/2018 FINDINGS: Progression of bibasilar airspace disease right greater than left. No significant effusion. Negative for pulmonary edema. IMPRESSION: Progression of bibasilar airspace disease right greater than left. Probable pneumonia. Electronically Signed   By: Marlan Palau M.D.   On: 10/03/2018 10:58   US Abdomen Limited Ruq  Result Date: 10/04/2018 CLINICAL DATA:  Abnormal LFTs, history hypertension EXAM: ULTRASOUND ABDOMEN LIMITED RIGHT UPPER QUADRANT COMPARISON:  None FINDINGS: Gallbladder: Normally distended without stones or wall thickening. No pericholecystic fluid or sonographic Murphy sign. Common bile duct: Diameter: 8 mm diameter Liver: Normal hepatic echogenicity. No focal hepatic mass or nodularity. No intrahepatic biliary dilatation. Portal vein is patent on color Doppler imaging with normal direction of blood flow towards the liver. No RIGHT upper quadrant free fluid. IMPRESSION: Extrahepatic biliary dilatation, CBD 8 mm, without accompanying intrahepatic dilatation or identification of gallstones. Recommend correlation with LFTs. If there is clinical concern for distal CBD  obstruction, consider MRCP. Electronically Signed   By: Ulyses Southward M.D.   On: 10/04/2018 16:31    Microbiology: Recent Results (from the past 240 hour(s))  Blood culture (routine x 2)     Status: None   Collection Time: 09/30/18  9:30 AM  Result Value Ref Range Status   Specimen Description   Final    BLOOD LEFT ARM Performed at [facility], [address] [place], [place] [address]    Special Requests   Final    BOTTLES DRAWN AEROBIC AND ANAEROBIC Blood Culture adequate volume Performed at [facility], [address] [place], [place] [address]    Culture   Final    NO GROWTH 5 DAYS Performed at [facility], [address] [place], [place] [address]    Report Status 10/05/2018 FINAL  Final  Blood culture (routine x 2)     Status: None   Collection Time: 09/30/18  9:36 AM  Result Value Ref Range Status   Specimen Description   Final    BLOOD LEFT HAND Performed at [facility], [address] [facility] [address] [place], [place] [address]    Special Requests   Final    BOTTLES DRAWN AEROBIC AND ANAEROBIC Blood Culture adequate volume Performed at [facility], [address] [place], [place] [address]    Culture   Final    NO GROWTH 5 DAYS Performed at [facility], [address] [place], [place] [address]    Report Status 10/05/2018 FINAL  Final  Respiratory Panel by PCR     Status: None   Collection Time: 10/01/18  9:10 AM  Result Value Ref Range Status  Adenovirus NOT DETECTED NOT DETECTED Final   Coronavirus 229E NOT DETECTED NOT DETECTED Final   Coronavirus HKU1 NOT DETECTED NOT DETECTED Final   Coronavirus NL63 NOT DETECTED NOT DETECTED Final   Coronavirus OC43 NOT DETECTED NOT DETECTED Final   Metapneumovirus NOT DETECTED NOT DETECTED Final   Rhinovirus / Enterovirus NOT DETECTED NOT DETECTED Final   Influenza A NOT DETECTED NOT DETECTED Final   Influenza B NOT DETECTED NOT DETECTED Final   Parainfluenza Virus 1  NOT DETECTED NOT DETECTED Final   Parainfluenza Virus 2 NOT DETECTED NOT DETECTED Final   Parainfluenza Virus 3 NOT DETECTED NOT DETECTED Final   Parainfluenza Virus 4 NOT DETECTED NOT DETECTED Final   Respiratory Syncytial Virus NOT DETECTED NOT DETECTED Final   Bordetella pertussis NOT DETECTED NOT DETECTED Final   Chlamydophila pneumoniae NOT DETECTED NOT DETECTED Final   Mycoplasma pneumoniae NOT DETECTED NOT DETECTED Final    Comment: Performed at [facility], [address], [place], [place] [address]  MRSA PCR Screening     Status: None   Collection Time: 10/02/18 11:33 AM  Result Value Ref Range Status   MRSA by PCR NEGATIVE NEGATIVE Final    Comment:        The GeneXpert MRSA Assay (FDA approved for NASAL specimens only), is one component of a comprehensive MRSA colonization surveillance program. It is not intended to diagnose MRSA infection nor to guide or monitor treatment for MRSA infections. Performed at [facility], [address] [place], [place] [address]      Labs: Basic Metabolic Panel: Recent Labs  Lab 09/30/18 0941 10/01/18 0541 10/03/18 0307 10/03/18 0748 10/04/18 0307 10/05/18 0445  NA 132* 137  --  136 138 139  K 3.7 4.5  --  3.6 3.1* 3.7  CL 96* 106  --  105 108 107  CO2 23 22  --  23 23 24   GLUCOSE 153* 135*  --  133* 140* 118*  BUN 17 15  --  10 10 10   CREATININE 1.14* 1.00 0.94 0.94 0.74 0.76  CALCIUM 9.0 8.3*  --  8.1* 7.9* 8.7*  MG  --   --   --  2.1 2.1 2.2  PHOS  --   --   --  2.4* 3.0 2.5   Liver Function Tests: Recent Labs  Lab 09/30/18 0941 10/03/18 0748 10/04/18 0307 10/05/18 0445  AST 21 63* 67* 66*  ALT 18 45* 61* 79*  ALKPHOS 61 56 54 70  BILITOT 0.8 0.7 0.8 0.7  PROT 8.4* 6.6 5.9* 6.8  ALBUMIN 4.1 2.8* 2.6* 2.8*   No results for input(s): LIPASE, AMYLASE in the last 168 hours. No results for input(s): AMMONIA in the last 168 hours. CBC: Recent Labs  Lab 09/30/18 0941  10/02/18 0607  10/03/18 0748 10/04/18 0307 10/05/18 0445 10/06/18 0506  WBC 11.9*   < > 13.2* 10.6* 9.8 9.8 8.2  NEUTROABS 9.0*  --   --  8.1* 6.9 6.6 4.5  HGB 13.2   < > 12.7 11.4* 10.5* 11.3* 11.2*  HCT 41.0   < > 39.9 35.5* 32.0* 34.2* 34.8*  MCV 100.7*   < > 104.5* 101.7* 101.3* 100.3* 100.6*  PLT 195   < > 165 188 184 232 325   < > = values in this interval not displayed.   Cardiac Enzymes: No results for input(s): CKTOTAL, CKMB, CKMBINDEX, TROPONINI in the last 168 hours. BNP: BNP (last 3 results) No results for  input(s): BNP in the last 8760 hours.  ProBNP (last 3 results) No results for input(s): PROBNP in the last 8760 hours.  CBG: No results for input(s): GLUCAP in the last 168 hours.     Signed:  Antwanette Wesche  [facility] 10/06/2018, 10:26 AM

## 2018-10-06 NOTE — Discharge Instructions (Signed)

## 2018-10-06 NOTE — Progress Notes (Signed)
Patient ambulated for 10 min on room air. O2 sat 93-95%. Mildly SOB, chest clear throughout. Nonproductive cough.

## 2018-10-07 NOTE — Telephone Encounter (Signed)
Hi Katie Johnson- can you call them?  I am not sure what [facility] home [facility] needs.  If they need me to sign home health order then sure, I am glad to

## 2018-10-08 MED FILL — levoFLOXacin 500 MG TABS: 500 | 2 days supply | Qty: 2 | Fill #0

## 2018-10-08 MED FILL — SM MUCUS RELIEF 600 MG TB12: 600 | 10 days supply | Qty: 40 | Fill #0

## 2018-10-09 ENCOUNTER — Telehealth: Payer: Self-pay

## 2018-10-09 NOTE — Progress Notes (Addendum)
[facility] [address] [place], [place] [address] [phone]  Date:  10/11/2018   Name:  Katie Johnson   DOB:  09-16-1936   MRN:  [id number]  PCP:  Pearline Cables, MD    Chief Complaint: No chief complaint on file.   History of Present Illness:  Katie Johnson is a 82 y.o. very pleasant female patient who presents with the following:  Following up from recent hospital stay for CAP today  Admit date: 09/30/2018 Discharge date: 10/06/2018 Recommendations for Outpatient Follow-up:  Patient will be discharged to home with home health physical and occupational therapy.  Patient will need to follow up with primary care provider within one week of discharge, repeat CBC and CMP.  Follow up with gastroenterology. Patient should continue medications as prescribed.  Patient should follow a heart healthy diet.  Discharge Diagnoses:  Acute respiratory failure with hypoxia in the setting of community-acquired pneumonia Sepsis secondary to community-acquired pneumonia Essential Hypertension Hyponatremia Chronic kidney disease, stage III Generalized Weakness Hypokalemia Hypophosphatemia Abnormal LFTs Macrocytic Anemia History of present illness:  on 09/30/2018 by Dr. Ulla Gallo Burkeis a 82 y.o.femalewith medical history significant forhypertension, hyperlipidemia, hearing loss,who presented to [facility] ED with complaints of worsening productive cough and poor appetite of 3-daysduration. Recently returned from a trip to [place] 1 week ago. Denies chest pain or dyspnea at rest. Admits to close contact exposure to flu. Uponpresentation to the ED patient is hypoxic and febrile with T-max of 102.8, tachypneicwith respiration rate 28. Chest x-ray revealed right middle lobe infiltrate with suspicion for community-acquired pneumonia. TRH asked to admit.  Hospital Course:  Acute respiratory failure with hypoxia in the  setting of community-acquired pneumonia -Patient was admitted to stepdown and required high flow oxygen  -PCCM consulted and appreciated  -Chest x-ray11/28/19suggested bilateral pulmonary edema which is slightly increased from previous chest x-ray suggesting a mild volume overload, IVF was discontinued and she was given some IV lasix -Able to ambulate today without oxygen desaturation -CXR reviewed today and shows improvement Sepsis secondary to community-acquired pneumonia -Patient presented with tachypnea, feverand source of infection -Sepsis Physiology is much improved -Chest x-ray reviewed which noted infiltrates in the right middle lobe and right hilum along with left lung base -CT chest:Dense geographic consolidation with peripheral air bronchograms right lower lobe. Mild patchy tree-in-bud opacity in the peripheral right middle lobe. No discrete lung masses or central airway stenosis. Findings most compatible with multilobar pneumonia. Repeat x-ray in 4 to 6 weeks. -Influenza PCRand respiratory viral panelnegative -was placed on nebulizer treatments, Mucinex, flutter valve -She continued to spike fevers, and was transitioned from  Azithromycin and Ceftriaxone to more broader coverage with vancomycin and cefepime -patient improved and was transitioned to levaquin on 10/04/2018, and will be discharged with additional 2 doses  -Strep pneumo antigen was negativeand Urine Legionella was Negative Essential Hypertension -Continue Amlodipine  Hyponatremia -Resolved with IVF and was getting NS at 50 mL/hr;this is now been discontinued given her mild volume overload on chest x-ray -Sodium currently 139(was 132 on admission) Chronic kidney disease, stage III -Creatinine stable Generalized Weakness -Likely secondary to pneumonia -PT recommended HH Hypokalemia -resolved Hypophosphatemia, improved -resolved  Abnormal LFTs -Likely related to infection and sepsis -Mildly  elevated -Acute hepatitis panel negative -RUQ U/S showedextrahepatic biliary dilatation the common bile duct being 8 mm without any accompanying intrahepatic dilatation or identification of gallstones. -Patient did not want further work-up  including MRCP and states that she will do this as an outpatient -Follow up with outpatient GI -repeat CMP in one week Macrocytic Anemia -Likely in the setting of chronic kidney disease -Patient's hemoglobin/hematocritstable at 11.3/34.2  Chest film 11/27: COMPARISON:  09/30/2018 FINDINGS: Progression of bibasilar airspace disease right greater than left. No significant effusion. Negative for pulmonary edema. IMPRESSION: Progression of bibasilar airspace disease right greater than left. Probable pneumonia.  She had just been on a trip to [place] when she got sick. Got home on 11/19.  She took her 2nd dose of shingles vaccine a couple of days later.  She felt a bit off after the shot, and she fell at home, felt really weak, and came to the ER -She [facility] and [facility]  She was inpt for 5 days and then released to home She is still feeling weak but is getting her strength back.  She does feel like she is making progress  She was not thrilled with communication from the hospital and we went over her medications and instructions in detail today She was told to have OT but does not think that she needs it She does have PT coming out for 3 weeks which is fine  She is just finishing out her levaquin   No fever noted She is using her spirometer at home   Patient Active Problem List   Diagnosis Date Noted  . Acute respiratory failure with hypoxia (HCC) 10/03/2018  . CAP (community acquired pneumonia) 10/03/2018  . Hyponatremia 10/03/2018  . CKD (chronic kidney disease) stage 3, GFR 30-59 ml/min (HCC) 10/03/2018  . Generalized weakness 10/03/2018  . Hypophosphatemia 10/03/2018  . Abnormal LFTs  10/03/2018  . Macrocytic anemia 10/03/2018  . Sepsis (HCC) 09/30/2018  . AC (acromioclavicular) arthritis 12/21/2017  . Essential hypertension 11/28/2017  . Degenerative arthritis of knee, bilateral 07/15/2015  . De Quervain's disease (tenosynovitis) 09/15/2011  . METATARSALGIA 07/14/2010  . BUNIONS, BILATERAL 07/14/2010  . FOOT PAIN, BILATERAL 07/14/2010  . BACK PAIN, THORACIC REGION 03/30/2010    Past Medical History:  Diagnosis Date  . Arthritis   . Chicken pox   . GERD (gastroesophageal reflux disease)   . High blood pressure   . Hypertension     History reviewed. No pertinent surgical history.  Social History   Tobacco Use  . Smoking status: Never Smoker  . Smokeless tobacco: Never Used  Substance Use Topics  . Alcohol use: Not on file  . Drug use: Not on file    Family History  Problem Relation Age of Onset  . COPD Mother   . Heart disease Mother   . Diabetes Father   . Heart disease Father     Allergies  Allergen Reactions  . Shellfish-Derived Products Anaphylaxis  . Statins Diarrhea and Nausea And Vomiting  . Pravastatin Diarrhea  . Ketoprofen Rash    Medication list has been reviewed and updated.  Current Outpatient Medications on File Prior to Visit  Medication Sig Dispense Refill  . amLODipine-benazepril (LOTREL) 10-20 MG capsule Take 1 capsule by mouth daily. 90 capsule 3  . cholecalciferol (VITAMIN D) 1000 units tablet Take 2,000 Units by mouth daily.    . diclofenac sodium (VOLTAREN) 1 % GEL Apply 2 g topically 4 (four) times daily. 1 Tube 3  . guaiFENesin (MUCINEX) 600 MG 12 hr tablet Take 2 tablets (1,200 mg total) by mouth 2 (two) times daily. 14 tablet 0  .  levofloxacin (LEVAQUIN) 500 MG tablet Take 1 tablet (500 mg total) by mouth daily. 2 tablet 0  . Multiple Vitamins-Minerals (CENTRUM ADULTS PO) Take by mouth.     No current facility-administered medications on file prior to visit.     Review of Systems:  As per HPI- otherwise  negative.   Physical Examination: Vitals:   10/11/18 1501  BP: (!) 143/65  Pulse: 79  Resp: 16  Temp: 98.4 F (36.9 C)  SpO2: 99%   Vitals:   10/11/18 1501  Weight: 162 lb (73.5 kg)  Height: 5\' 4"  (1.626 m)   Body mass index is 27.81 kg/m. Ideal Body Weight: Weight in (lb) to have BMI = 25: 145.3  GEN: WDWN, NAD, Non-toxic, A & O x 3, looks well, normal weight for age  HEENT: Atraumatic, Normocephalic. Neck supple. No masses, No LAD.  Bilateral TM wnl, oropharynx normal.  PEERL,EOMI.   Ears and Nose: No external deformity. CV: RRR, No M/G/R. No JVD. No thrill. No extra heart sounds. PULM: CTA B, no wheezes, crackles, rhonchi. No retractions. No resp. distress. No accessory muscle use. ABD: S, NT, ND, +BS. No rebound. No HSM. EXTR: No c/c/e NEURO Normal gait.  PSYCH: Normally interactive. Conversant. Not depressed or anxious appearing.  Calm demeanor.    Assessment and Plan: Hospital discharge follow-up - Plan: CBC, Comprehensive metabolic panel, DG Chest 2 View  Community acquired pneumonia of right lung, unspecified part of lung - Plan: CBC, Comprehensive metabolic panel, DG Chest 2 View  Following up from recent hospitalization for community-acquired pneumonia today. She is feeling better, not quite her normal self but improving. And does not wish to do occupational therapy.  She is very high functioning for age, and I agree that OT is probably not necessary.  However, she does wish to have physical therapy. I have asked her to alert me if she does not continue to improve and get back to her baseline. Follow-up on her renal function today with BMP  Her LFTs were up, will repeat today Ordered chest film for her to have done in about one month  Signed Abbe Amsterdam, MD   Received her labs 12/6 Results for orders placed or performed in visit on 10/11/18  CBC  Result Value Ref Range   WBC 8.9 4.0 - 10.5 K/uL   RBC 3.83 (L) 3.87 - 5.11 Mil/uL   Platelets 460.0  (H) 150.0 - 400.0 K/uL   Hemoglobin 12.7 12.0 - 15.0 g/dL   HCT 16.1 09.6 - 04.5 %   MCV 98.5 78.0 - 100.0 fl   MCHC 33.7 30.0 - 36.0 g/dL   RDW 40.9 81.1 - 91.4 %  Comprehensive metabolic panel  Result Value Ref Range   Sodium 135 135 - 145 mEq/L   Potassium 5.1 3.5 - 5.1 mEq/L   Chloride 99 96 - 112 mEq/L   CO2 26 19 - 32 mEq/L   Glucose, Bld 91 70 - 99 mg/dL   BUN 29 (H) 6 - 23 mg/dL   Creatinine, Ser 7.82 (H) 0.40 - 1.20 mg/dL   Total Bilirubin 0.3 0.2 - 1.2 mg/dL   Alkaline Phosphatase 74 39 - 117 U/L   AST 55 (H) 0 - 37 U/L   ALT 150 (H) 0 - 35 U/L   Total Protein 7.6 6.0 - 8.3 g/dL   Albumin 4.1 3.5 - 5.2 g/dL   Calcium 9.6 8.4 - 95.6 mg/dL   GFR 21.30 (L) >86.57 mL/min    Your blood counts  look good.  Mild anemia is resolved. Your kidney function numbers have crept up a bit again.  However, numbers are not alarmingly high.  Let us plan to check this back in about 6 weeks. Your liver function tests-AST, ALT- are still a bit high.  Your discharge summary mentioned having you see a gastroenterologist.  Do you have a gastroenterology doctor?  If you do not I can make a referral for you.  Please let me know, you can reply to me directly here.  In the meantime avoid taking Tylenol, and avoid alcohol.  Please let me know if you are not continuing to feel better!  Unless we are able to have you see gastroenterology quite soon, I would like to repeat your liver function tests.  I would like to look at them again within the next 2 weeks.

## 2018-10-09 NOTE — Telephone Encounter (Signed)
10/09/18  Transition Care Management Follow-up Telephone Call  ADMISSION DATE: 09/30/18  DISCHARGE DATE: 10/05/18   How have you been since you were released from the hospital? Starting to feell better.  Do you understand why you were in the hospital? Yes   Do you understand the discharge instrcutions? Yes     Items Reviewed: Medications reviewed: Yes  Allergies reviewed:Yes  Dietary changes reviewed:  Heart health  Referrals reviewed: Appointment scheduled  Functional Questionnaire:  Activities of Daily Living (ADLs): Patient states she can perform all independentlly.  Any patient concerns? Patient concerned about medications.   Confirmed importance and date/time of follow-up visits scheduled: Yes  Confirmed with patient if condi  tion begins to worsen call PCP or go to the ER. Yes      Patient was given the office number and encouragred to call back with questions or concerns.Yes

## 2018-10-09 NOTE — Telephone Encounter (Signed)
Called the number back and put on hold without ever transferring.

## 2018-10-11 ENCOUNTER — Ambulatory Visit: Payer: Medicare Other | Admitting: Family Medicine

## 2018-10-11 ENCOUNTER — Encounter: Payer: Self-pay | Admitting: Family Medicine

## 2018-10-11 VITALS — BP 143/65 | HR 79 | Temp 98.4°F | Resp 16 | Ht 64.0 in | Wt 162.0 lb

## 2018-10-11 DIAGNOSIS — Z09 Encounter for follow-up examination after completed treatment for conditions other than malignant neoplasm: Secondary | ICD-10-CM

## 2018-10-11 DIAGNOSIS — J189 Pneumonia, unspecified organism: Secondary | ICD-10-CM | POA: Diagnosis not present

## 2018-10-11 NOTE — Patient Instructions (Addendum)
It was good to see you today- take care!   Will be in touch with your labs aspa  Please have a chest x-ray done here in about one month at your convenience  Please let me know if you do not continue to get your strength back

## 2018-10-12 ENCOUNTER — Encounter: Payer: Self-pay | Admitting: Family Medicine

## 2018-10-12 DIAGNOSIS — R7989 Other specified abnormal findings of blood chemistry: Secondary | ICD-10-CM

## 2018-10-12 DIAGNOSIS — R945 Abnormal results of liver function studies: Principal | ICD-10-CM

## 2018-10-12 LAB — CBC
HCT: 37.8 % (ref 36.0–46.0)
Hemoglobin: 12.7 g/dL (ref 12.0–15.0)
MCHC: 33.7 g/dL (ref 30.0–36.0)
MCV: 98.5 fl (ref 78.0–100.0)
Platelets: 460 10*3/uL — ABNORMAL HIGH (ref 150.0–400.0)
RBC: 3.83 Mil/uL — ABNORMAL LOW (ref 3.87–5.11)
RDW: 13.6 % (ref 11.5–15.5)
WBC: 8.9 10*3/uL (ref 4.0–10.5)

## 2018-10-12 LAB — COMPREHENSIVE METABOLIC PANEL
ALT: 150 U/L — AB (ref 0–35)
AST: 55 U/L — ABNORMAL HIGH (ref 0–37)
Albumin: 4.1 g/dL (ref 3.5–5.2)
Alkaline Phosphatase: 74 U/L (ref 39–117)
BUN: 29 mg/dL — AB (ref 6–23)
CO2: 26 mEq/L (ref 19–32)
Calcium: 9.6 mg/dL (ref 8.4–10.5)
Chloride: 99 mEq/L (ref 96–112)
Creatinine, Ser: 1.28 mg/dL — ABNORMAL HIGH (ref 0.40–1.20)
GFR: 42.61 mL/min — ABNORMAL LOW (ref >60.00)
Glucose, Bld: 91 mg/dL (ref 70–99)
Potassium: 5.1 mEq/L (ref 3.5–5.1)
Sodium: 135 mEq/L (ref 135–145)
Total Bilirubin: 0.3 mg/dL (ref 0.2–1.2)
Total Protein: 7.6 g/dL (ref 6.0–8.3)

## 2018-10-16 ENCOUNTER — Encounter: Payer: Self-pay | Admitting: Gastroenterology

## 2018-10-17 ENCOUNTER — Ambulatory Visit (HOSPITAL_BASED_OUTPATIENT_CLINIC_OR_DEPARTMENT_OTHER)
Admission: RE | Admit: 2018-10-17 | Discharge: 2018-10-17 | Disposition: A | Discharge status: Home / Self Care | Payer: Medicare Other | Source: Ambulatory Visit | Attending: Family Medicine | Admitting: Family Medicine

## 2018-10-17 ENCOUNTER — Encounter: Payer: Self-pay | Admitting: Family Medicine

## 2018-10-17 DIAGNOSIS — J189 Pneumonia, unspecified organism: Secondary | ICD-10-CM | POA: Insufficient documentation

## 2018-10-17 DIAGNOSIS — Z09 Encounter for follow-up examination after completed treatment for conditions other than malignant neoplasm: Secondary | ICD-10-CM | POA: Diagnosis not present

## 2018-10-19 ENCOUNTER — Ambulatory Visit: Payer: Self-pay | Admitting: *Deleted

## 2018-10-19 NOTE — Telephone Encounter (Signed)
Galina, physical therapist with [facility] calling stating that after physical therapy the pt has a BP of 80/50 , P-87 and O2 sats 95%. Garnett FarmGalina states that the BP was checked using a manual cuff while the pt was sitting.Garnett FarmGalina states she did not check BP before the pt had PT.Garnett FarmGalina reports that the pt's BP previously has been 110/70 and 120/80. BP rechecked while on the phone with triage nurse and it was 100/60. Pt denies feeling lightheaded, weak or dizzy at this time. Pt reports that she did Amlodipine-Benazepril 10-20 mg capsule today around 7-7:30 am today with breakfast. Pt advised to return call to the office  if BP decreases below 90 and she starts to have symptoms or to seek treatment in the ED if BP remains 80/50. Galina,PT and pt verbalized understanding.  Reason for Disposition . [1] Systolic BP 90-110 AND [2] taking blood pressure medications AND [3] NOT dizzy, lightheaded or weak  Answer Assessment - Initial Assessment Questions 1. BLOOD PRESSURE: "What is the blood pressure?" "Did you take at least two measurements 5 minutes apart?"     80/50 with sitting 2. ONSET: "When did you take your blood pressure?"     Right before 3. HOW: "How did you obtain the blood pressure?" (e.g., visiting nurse, automatic home BP monitor)     manual 4. HISTORY: "Do you have a history of low blood pressure?" "What is your blood pressure normally?"     BP is normally 110/70 5. MEDICATIONS: "Are you taking any medications for blood pressure?" If yes: "Have they been changed recently?"     Yes amlodipine was taken this morning 6. PULSE RATE: "Do you know what your pulse rate is?"      87 7. OTHER SYMPTOMS: "Have you been sick recently?" "Have you had a recent injury?"     No 8. PREGNANCY: "Is there any chance you are pregnant?" "When was your last menstrual period?"     n/a  Protocols used: LOW BLOOD PRESSURE-A-AH

## 2018-10-22 ENCOUNTER — Telehealth: Payer: Self-pay | Admitting: *Deleted

## 2018-10-22 NOTE — Telephone Encounter (Signed)
[facility]; forwarded to provider/SLS 12/16

## 2018-10-29 ENCOUNTER — Telehealth: Payer: Self-pay | Admitting: *Deleted

## 2018-10-29 NOTE — Telephone Encounter (Signed)
[facility]; forwarded to provider/SLS

## 2018-11-02 ENCOUNTER — Telehealth: Payer: Self-pay | Admitting: *Deleted

## 2018-11-02 NOTE — Telephone Encounter (Signed)
[facility]; forwarded to provider/SLS 12/27

## 2018-11-08 ENCOUNTER — Ambulatory Visit: Payer: Medicare Other | Admitting: Family Medicine

## 2018-11-13 ENCOUNTER — Ambulatory Visit: Payer: Medicare Other | Admitting: Gastroenterology

## 2018-11-13 ENCOUNTER — Encounter: Payer: Self-pay | Admitting: Gastroenterology

## 2018-11-13 ENCOUNTER — Other Ambulatory Visit (INDEPENDENT_AMBULATORY_CARE_PROVIDER_SITE_OTHER): Payer: Medicare Other

## 2018-11-13 VITALS — BP 126/72 | HR 83 | Ht 64.0 in | Wt 164.0 lb

## 2018-11-13 DIAGNOSIS — R740 Nonspecific elevation of levels of transaminase and lactic acid dehydrogenase [LDH]: Secondary | ICD-10-CM

## 2018-11-13 DIAGNOSIS — R932 Abnormal findings on diagnostic imaging of liver and biliary tract: Secondary | ICD-10-CM

## 2018-11-13 DIAGNOSIS — R7401 Elevation of levels of liver transaminase levels: Secondary | ICD-10-CM

## 2018-11-13 LAB — HEPATIC FUNCTION PANEL
ALT: 25 U/L (ref 0–35)
AST: 21 U/L (ref 0–37)
Albumin: 4.5 g/dL (ref 3.5–5.2)
Alkaline Phosphatase: 63 U/L (ref 39–117)
BILIRUBIN TOTAL: 0.4 mg/dL (ref 0.2–1.2)
Bilirubin, Direct: 0.1 mg/dL (ref 0.0–0.3)
Total Protein: 7.4 g/dL (ref 6.0–8.3)

## 2018-11-13 NOTE — Progress Notes (Signed)
Chief Complaint: Elevated LAEs   Referring Provider:     Pearline Cablesopland, Jessica C, MD    HPI:     Katie Johnson is a 83 y.o. female referred to the Gastroenterology Clinic for evaluation of elevated liver enzymes.  She was recently hospitalized for pneumonia with septic physiology 11/24 to 10/06/2018 and on that admission noted to have elevated liver enzymes.  Followed up with her PCM on 10/11/2018 and was clinically improving at that time and completing antibiotics with Levaquin.  Repeat labs at that time as outlined below with uptrending LAEs at that time, prompting referral to GI.  Otherwise normal CBC on 10/11/2018.  She denies any previous known history of elevated liver enzymes, hepatobiliary or pancreatic disease.  No known family history of liver, biliary, pancreatic disease. She denies any jaundice, icteric sclera, acites. Good PO intake since hpsital d/c. Still very active, walking and riding bike daily. Denies new medications prior to admission or since d/c (aside from Levaquin, which she has since completed). No new supplements or herbal meds.  Has avoided any alcohol or APAP since 10/11/2018 due to elevated enzymes.  Recent inpatient course regarding elevated LAE's as below: - RUQ ultrasound showed extrahepatic biliary dilation with CBD 8 mm without intrahepatic dilation, gallstones, CDL.  Patient declined MRCP - 09/30/2018 (day of admission): AST/ALT/ALP: 63/45/61 - Peak AST/ALT/ALP on admission was 67/79/70 with all normal bilirubins - Repeat outpatient labs on 10/11/2018: AST/ALT/ALP/bili: 55/150/74/0 0.3 - Acute viral hepatitis panel negative - Normal B12, folate, iron panel (mildly elevated ferritin 564 in the setting of recent pneumonia) - Previous labs on 06/04/2018 with AST/ALT/ALP 13/14/79   Past Medical History:  Diagnosis Date  . Arthritis   . Chicken pox   . GERD (gastroesophageal reflux disease)   . High blood pressure   . Hypertension      Past  Surgical History:  Procedure Laterality Date  . COLONOSCOPY     Dr Jeani HawkingPatrick Hung before 2010 per patient   . ESOPHAGOGASTRODUODENOSCOPY     over 20 years ago   Family History  Problem Relation Age of Onset  . COPD Mother   . Heart disease Mother   . Diabetes Father   . Heart disease Father   . Colon cancer Neg Hx   . Esophageal cancer Neg Hx    Social History   Tobacco Use  . Smoking status: Never Smoker  . Smokeless tobacco: Never Used  Substance Use Topics  . Alcohol use: Not Currently  . Drug use: Never   Current Outpatient Medications  Medication Sig Dispense Refill  . amLODipine-benazepril (LOTREL) 10-20 MG capsule Take 1 capsule by mouth daily. 90 capsule 3  . cholecalciferol (VITAMIN D) 1000 units tablet Take 2,000 Units by mouth daily.    . diclofenac sodium (VOLTAREN) 1 % GEL Apply 2 g topically 4 (four) times daily. 1 Tube 3  . Multiple Vitamins-Minerals (CENTRUM ADULTS PO) Take by mouth.     No current facility-administered medications for this visit.    Allergies  Allergen Reactions  . Shellfish-Derived Products Anaphylaxis  . Statins Diarrhea and Nausea And Vomiting  . Pravastatin Diarrhea  . Ketoprofen Rash     Review of Systems: All systems reviewed and negative except where noted in HPI.     Physical Exam:    Wt Readings from Last 3 Encounters:  11/13/18 164 lb (74.4 kg)  10/11/18 162 lb (73.5 kg)  09/30/18  161 lb (73 kg)    BP 126/72   Pulse 83   Ht 5\' 4"  (1.626 m)   Wt 164 lb (74.4 kg)   BMI 28.15 kg/m  Constitutional:  Pleasant, in no acute distress. Psychiatric: Normal mood and affect. Behavior is normal. EENT: Pupils normal.  Conjunctivae are normal. No scleral icterus. Neck supple. No cervical LAD. Cardiovascular: Normal rate, regular rhythm. No edema Pulmonary/chest: Effort normal and breath sounds normal. No wheezing, rales or rhonchi. Abdominal: Soft, nondistended, nontender. Bowel sounds active throughout. There are no  masses palpable. No hepatomegaly. Neurological: Alert and oriented to person place and time. Skin: Skin is warm and dry. No rashes noted.   ASSESSMENT AND PLAN;   Katie Johnson is a 83 y.o. female presenting with:  1) Elevated ALT>AST: Normal liver enzymes in August 2019 with elevated AST/ALT on recent admission.  Suspect initial insult secondary to sepsis.  Otherwise normal ALP and T bili and ultrasound negative for cholelithiasis or CDL.  Repeat labs as an outpatient demonstrated uptrending ALT with essentially stable AST, with continued normal ALP and T bili.  Suspect this ongoing elevation secondary to antibiotics.  She otherwise feels well and no clinical serologic evidence of impaired hepatic synthetic function.  No preceding family or personal history of hepatobiliary or pancreatic disease.  - Repeat LAEs today.  If downtrending or normalization, no further evaluation needed at this time - If LAE's uptrending, can pursue further serologic evaluation  2) Dilated CBD: 8 mm CBD is at ULN in this 83 year old.  Otherwise with normal ALP and T bili and no intrahepatic dilation, cholelithiasis, CDL.  Plan to monitor for now.  Can consider repeat ultrasound in 6 months or so.  If, however, LAE's uptrending as above, can certainly pursue with additional imaging at this time.  I spent a total of 30 minutes of face-to-face time with the patient. Greater than 50% of the time was spent counseling and coordinating care.   Shellia Cleverly, DO, FACG  11/13/2018, 10:03 AM   Copland, Gwenlyn Found, MD

## 2018-11-13 NOTE — Patient Instructions (Signed)
If you are age 81 or older, your body mass index should be between 23-30. Your Body mass index is 28.15 kg/m. If this is out of the aforementioned range listed, please consider follow up with your Primary Care Provider.  If you are age 81 or younger, your body mass index should be between 19-25. Your Body mass index is 28.15 kg/m. If this is out of the aformentioned range listed, please consider follow up with your Primary Care Provider.   Please go to the lab on the 2nd floor [address] before you leave the office today.   It was a pleasure to see you today!  Vito Cirigliano, D.O.

## 2018-11-14 NOTE — Progress Notes (Signed)
[name] [address] [place], [place] [address] Phone: [phone] Subjective:    [name] am serving as a Neurosurgeon for Dr. Antoine Primas.     CC: Bilateral knee pain  OZH:Katie Johnson  Katie Johnson is a 83 y.o. female coming in with complaint of bilateral knee pain. States that her left knee is worse than right. Issues with stairs and sitting to standing positions.  Moderate to severe arthritis of the knees bilaterally.  Last injection was 5 months ago.  Started having worsening pain with instability as well as patient is noticing some increasing swelling.  Different activities of daily living becoming more difficult -     Past Medical History:  Diagnosis Date  . Arthritis   . Chicken pox   . GERD (gastroesophageal reflux disease)   . High blood pressure   . Hypertension    Past Surgical History:  Procedure Laterality Date  . COLONOSCOPY     Dr Jeani Hawking before 2010 per patient   . ESOPHAGOGASTRODUODENOSCOPY     over 20 years ago   Social History   Socioeconomic History  . Marital status: Married    Spouse name: Not on file  . Number of children: Not on file  . Years of education: Not on file  . Highest education level: Not on file  Occupational History  . Occupation: Agricultural consultant at [employer]  . Financial resource strain: Not on file  . Food insecurity:    Worry: Not on file    Inability: Not on file  . Transportation needs:    Medical: Not on file    Non-medical: Not on file  Tobacco Use  . Smoking status: Never Smoker  . Smokeless tobacco: Never Used  Substance and Sexual Activity  . Alcohol use: Not Currently  . Drug use: Never  . Sexual activity: Not on file  Lifestyle  . Physical activity:    Days per week: Not on file    Minutes per session: Not on file  . Stress: Not on file  Relationships  . Social connections:    Talks on phone: Not on file    Gets together: Not on file    Attends religious  service: Not on file    Active member of club or organization: Not on file    Attends meetings of clubs or organizations: Not on file    Relationship status: Not on file  Other Topics Concern  . Not on file  Social History Narrative  . Not on file   Allergies  Allergen Reactions  . Shellfish-Derived Products Anaphylaxis  . Statins Diarrhea and Nausea And Vomiting  . Pravastatin Diarrhea  . Ketoprofen Rash   Family History  Problem Relation Age of Onset  . COPD Mother   . Heart disease Mother   . Diabetes Father   . Heart disease Father   . Colon cancer Neg Hx   . Esophageal cancer Neg Hx      Current Outpatient Medications (Cardiovascular):  .  amLODipine-benazepril (LOTREL) 10-20 MG capsule, Take 1 capsule by mouth daily.     Current Outpatient Medications (Other):  .  cholecalciferol (VITAMIN D) 1000 units tablet, Take 2,000 Units by mouth daily. .  diclofenac sodium (VOLTAREN) 1 % GEL, Apply 2 g topically 4 (four) times daily. .  Multiple Vitamins-Minerals (CENTRUM ADULTS PO), Take by mouth.    Past medical history, social, surgical and family history all reviewed in electronic  medical record.  No pertanent information unless stated regarding to the chief complaint.   Review of Systems:  No headache, visual changes, nausea, vomiting, diarrhea, constipation, dizziness, abdominal pain, skin rash, fevers, chills, night sweats, weight loss, swollen lymph nodes, body aches, joint swelling, muscle aches, chest pain, shortness of breath, mood changes.  Positive muscle aches  Objective  Blood pressure 110/70, pulse 70, height 5\' 4"  (1.626 m), weight 165 lb (74.8 kg), SpO2 98 %.    General: No apparent distress alert and oriented x3 mood and affect normal, dressed appropriately.  HEENT: Pupils equal, extraocular movements intact  Respiratory: Patient's speak in full sentences and does not appear short of breath  Cardiovascular: No lower extremity edema, non tender, no  erythema  Skin: Warm dry intact with no signs of infection or rash on extremities or on axial skeleton.  Abdomen: Soft nontender  Neuro: Cranial nerves II through XII are intact, neurovascularly intact in all extremities with 2+ DTRs and 2+ pulses.  Lymph: No lymphadenopathy of posterior or anterior cervical chain or axillae bilaterally.  Gait antalgic MSK:  tender with limtied range of motion and good stability and symmetric strength and tone of shoulders, elbows, wrist, hip, and ankles bilaterally.  Knee: Bilateral valgus deformity noted. Large thigh to calf ratio.  Tender to palpation over medial and PF joint line.  ROM full in flexion and extension and lower leg rotation. instability with valgus force.  painful patellar compression. Patellar glide with moderate crepitus. Patellar and quadriceps tendons unremarkable. Hamstring and quadriceps strength is normal.   After informed written and verbal consent, patient was seated on exam table. Right knee was prepped with alcohol swab and utilizing anterolateral approach, patient's right knee space was injected with 4:1  marcaine 0.5%: Kenalog 40mg /dL. Patient tolerated the procedure well without immediate complications.  After informed written and verbal consent, patient was seated on exam table. Left knee was prepped with alcohol swab and utilizing anterolateral approach, patient's left knee space was injected with 4:1  marcaine 0.5%: Kenalog 40mg /dL. Patient tolerated the procedure well without immediate complications.   Impression and Recommendations:     This case required medical decision making of moderate complexity. The above documentation has been reviewed and is accurate and complete Judi Saa, DO       Note: This dictation was prepared with Dragon dictation along with smaller phrase technology. Any transcriptional errors that result from this process are unintentional.

## 2018-11-15 ENCOUNTER — Ambulatory Visit: Payer: Medicare Other | Admitting: Family Medicine

## 2018-11-15 ENCOUNTER — Encounter: Payer: Self-pay | Admitting: Family Medicine

## 2018-11-15 DIAGNOSIS — M170 Bilateral primary osteoarthritis of knee: Secondary | ICD-10-CM | POA: Diagnosis not present

## 2018-11-15 NOTE — Assessment & Plan Note (Signed)
Patient given injection today.  Tolerated the procedure well.  Discussed icing regimen and home exercise.  Discussed which activities to do which wants to avoid.  Patient is to increase activity slowly over the course the next several days.could be candidate for visco.  Spent  25 minutes with patient face-to-face and had greater than 50% of counseling including as described above in assessment and plan.

## 2018-11-15 NOTE — Patient Instructions (Signed)
Good to see you  Ice is yoru friend Stay active See me agaim om 3 months or call me soner if you want the gel injections

## 2018-12-02 NOTE — Progress Notes (Addendum)
[facility] [address], [address] [place], [place] [id number] [phone] [phone]  Date:  12/05/2018   Name:  Katie Buttonnn B Arzola   DOB:  09/20/1936   MRN:  [id number]  PCP:  Pearline Cablesopland, Chalee Hirota C, MD    Chief Complaint: Hypertension (6 month follow up, med refills) and Anemia   History of Present Illness:  Katie Johnson is a 83 y.o. very pleasant female patient who presents with the following:  Here today for periodic follow-up visit History of chronic renal disease, macrocytic anemia, hypertension. She had generally been in good health, but was admitted for almost a week last November with pneumonia.  The pneumonia led to acute respiratory failure and sepsis Thankfully she has recovered well   She saw gastroenterology earlier this month for elevated liver function test-this was thought due to recent illness and sepsis, they are following it.  In fact, most recent liver function panel was normal  She is back to enjoying a glass of wine on occasion   Shingles vaccine?- per pt already done at [facility] in [place], we have added dates to her record  She is overall feeling well   Her only concern is her BP; would like to maybe decrease her medications if we can as her BP is running low. Will look at her renal function and get back with her    Patient Active Problem List   Diagnosis Date Noted  . Acute respiratory failure with hypoxia (HCC) 10/03/2018  . CAP (community acquired pneumonia) 10/03/2018  . Hyponatremia 10/03/2018  . CKD (chronic kidney disease) stage 3, GFR 30-59 ml/min (HCC) 10/03/2018  . Generalized weakness 10/03/2018  . Hypophosphatemia 10/03/2018  . Abnormal LFTs 10/03/2018  . Macrocytic anemia 10/03/2018  . Sepsis (HCC) 09/30/2018  . AC (acromioclavicular) arthritis 12/21/2017  . Essential hypertension 11/28/2017  . Degenerative arthritis of knee, bilateral 07/15/2015  . De Quervain's disease (tenosynovitis) 09/15/2011  .  METATARSALGIA 07/14/2010  . BUNIONS, BILATERAL 07/14/2010  . FOOT PAIN, BILATERAL 07/14/2010  . BACK PAIN, THORACIC REGION 03/30/2010    Past Medical History:  Diagnosis Date  . Arthritis   . Chicken pox   . GERD (gastroesophageal reflux disease)   . High blood pressure   . Hypertension     Past Surgical History:  Procedure Laterality Date  . COLONOSCOPY     Dr Jeani HawkingPatrick Hung before 2010 per patient   . ESOPHAGOGASTRODUODENOSCOPY     over 20 years ago    Social History   Tobacco Use  . Smoking status: Never Smoker  . Smokeless tobacco: Never Used  Substance Use Topics  . Alcohol use: Not Currently  . Drug use: Never    Family History  Problem Relation Age of Onset  . COPD Mother   . Heart disease Mother   . Diabetes Father   . Heart disease Father   . Colon cancer Neg Hx   . Esophageal cancer Neg Hx     Allergies  Allergen Reactions  . Shellfish-Derived Products Anaphylaxis  . Statins Diarrhea and Nausea And Vomiting  . Pravastatin Diarrhea  . Ketoprofen Rash    Medication list has been reviewed and updated.  Current Outpatient Medications on File Prior to Visit  Medication Sig Dispense Refill  . amLODipine-benazepril (LOTREL) 10-20 MG capsule Take 1 capsule by mouth daily. 90 capsule 3  . cholecalciferol (VITAMIN D) 1000 units tablet Take 2,000 Units by mouth daily.    .Marland Kitchen  Multiple Vitamins-Minerals (CENTRUM ADULTS PO) Take by mouth.     No current facility-administered medications on file prior to visit.     Review of Systems:  As per HPI- otherwise negative.  No CP or SOB No fever or chills Feeling well  Physical Examination: Vitals:   12/05/18 1557  BP: 112/68  Pulse: 64  Resp: 16  Temp: 97.9 F (36.6 C)  SpO2: 99%   Vitals:   12/05/18 1557  Weight: 164 lb 6.4 oz (74.6 kg)  Height: 5\' 4"  (1.626 m)   Body mass index is 28.22 kg/m. Ideal Body Weight: Weight in (lb) to have BMI = 25: 145.3  GEN: WDWN, NAD, Non-toxic, A & O x 3, mild  overweight, looks well  HEENT: Atraumatic, Normocephalic. Neck supple. No masses, No LAD. Ears and Nose: No external deformity. CV: RRR, No M/G/R. No JVD. No thrill. No extra heart sounds. PULM: CTA B, no wheezes, crackles, rhonchi. No retractions. No resp. distress. No accessory muscle use. ABD: S, NT, ND EXTR: No c/c/e NEURO Normal gait.  PSYCH: Normally interactive. Conversant. Not depressed or anxious appearing.  Calm demeanor.    Assessment and Plan: Decreased GFR - Plan: Basic metabolic panel  Mild anemia - Plan: CBC  Screening for hyperlipidemia - Plan: Lipid panel  Essential hypertension  LFT elevation  Following up today- labs pending as above LFTS back to normal at recent eval  Pt wonders if she can decrease her BP meds; will check her labs and then hopefully can do so Would like to see her renal function first   BP Readings from Last 3 Encounters:  12/05/18 112/68  11/15/18 110/70  11/13/18 126/72   Signed Abbe Amsterdam, MD  1/30- received her labs [facility] that I am going to change her to plain amlodipine as her K is high and BP low Will make change now, call her back to discuss   Called her on 1/31, discussed, she is ok to go on plain amlodipine She also uses mychart which I forgot, will send her the details  Results for orders placed or performed in visit on 12/05/18  Basic metabolic panel  Result Value Ref Range   Sodium 137 135 - 145 mEq/L   Potassium 5.4 (H) 3.5 - 5.1 mEq/L   Chloride 102 96 - 112 mEq/L   CO2 26 19 - 32 mEq/L   Glucose, Bld 100 (H) 70 - 99 mg/dL   BUN 37 (H) 6 - 23 mg/dL   Creatinine, Ser 3.41 (H) 0.40 - 1.20 mg/dL   Calcium 9.7 8.4 - 96.2 mg/dL   GFR 22.97 (L) >98.92 mL/min  CBC  Result Value Ref Range   WBC 7.7 4.0 - 10.5 K/uL   RBC 4.07 3.87 - 5.11 Mil/uL   Platelets 216.0 150.0 - 400.0 K/uL   Hemoglobin 13.3 12.0 - 15.0 g/dL   HCT 11.9 41.7 - 40.8 %   MCV 97.5 78.0 - 100.0 fl   MCHC 33.5 30.0 - 36.0 g/dL   RDW 14.4 81.8  - 56.3 %  Lipid panel  Result Value Ref Range   Cholesterol 262 (H) 0 - 200 mg/dL   Triglycerides 149.7 (H) 0.0 - 149.0 mg/dL   HDL 02.63 >78.58 mg/dL   VLDL 85.0 0.0 - 27.7 mg/dL   LDL Cholesterol 412 (H) 0 - 99 mg/dL   Total CHOL/HDL Ratio 3    NonHDL 186.37

## 2018-12-05 ENCOUNTER — Encounter: Payer: Self-pay | Admitting: Family Medicine

## 2018-12-05 ENCOUNTER — Ambulatory Visit: Payer: Medicare Other | Admitting: Family Medicine

## 2018-12-05 VITALS — BP 112/68 | HR 64 | Temp 97.9°F | Resp 16 | Ht 64.0 in | Wt 164.4 lb

## 2018-12-05 DIAGNOSIS — R944 Abnormal results of kidney function studies: Secondary | ICD-10-CM

## 2018-12-05 DIAGNOSIS — I10 Essential (primary) hypertension: Secondary | ICD-10-CM

## 2018-12-05 DIAGNOSIS — D649 Anemia, unspecified: Secondary | ICD-10-CM

## 2018-12-05 DIAGNOSIS — R7989 Other specified abnormal findings of blood chemistry: Secondary | ICD-10-CM

## 2018-12-05 DIAGNOSIS — Z13220 Encounter for screening for lipoid disorders: Secondary | ICD-10-CM

## 2018-12-05 DIAGNOSIS — R945 Abnormal results of liver function studies: Secondary | ICD-10-CM

## 2018-12-05 NOTE — Patient Instructions (Addendum)
I am so glad that you are doing well!  Please stop by the lab and get your blood drawn; I am just checking on your kidney funtion and anemia today, and your cholesterol  Please see me in about 6 months to check in, let me know if any concerns in the meantime

## 2018-12-06 LAB — BASIC METABOLIC PANEL
BUN: 37 mg/dL — ABNORMAL HIGH (ref 6–23)
CHLORIDE: 102 meq/L (ref 96–112)
CO2: 26 mEq/L (ref 19–32)
Calcium: 9.7 mg/dL (ref 8.4–10.5)
Creatinine, Ser: 1.51 mg/dL — ABNORMAL HIGH (ref 0.40–1.20)
GFR: 33.12 mL/min — ABNORMAL LOW (ref >60.00)
Glucose, Bld: 100 mg/dL — ABNORMAL HIGH (ref 70–99)
Potassium: 5.4 mEq/L — ABNORMAL HIGH (ref 3.5–5.1)
Sodium: 137 mEq/L (ref 135–145)

## 2018-12-06 LAB — CBC
HEMATOCRIT: 39.7 % (ref 36.0–46.0)
Hemoglobin: 13.3 g/dL (ref 12.0–15.0)
MCHC: 33.5 g/dL (ref 30.0–36.0)
MCV: 97.5 fl (ref 78.0–100.0)
Platelets: 216 10*3/uL (ref 150.0–400.0)
RBC: 4.07 Mil/uL (ref 3.87–5.11)
RDW: 13.9 % (ref 11.5–15.5)
WBC: 7.7 10*3/uL (ref 4.0–10.5)

## 2018-12-06 LAB — LIPID PANEL
Cholesterol: 262 mg/dL — ABNORMAL HIGH (ref 0–200)
HDL: 75.4 mg/dL (ref >39.00)
LDL Cholesterol: 152 mg/dL — ABNORMAL HIGH (ref 0–99)
NonHDL: 186.37
Total CHOL/HDL Ratio: 3
Triglycerides: 171 mg/dL — ABNORMAL HIGH (ref 0.0–149.0)
VLDL: 34.2 mg/dL (ref 0.0–40.0)

## 2018-12-06 MED ORDER — AMLODIPINE BESYLATE 10 MG PO TABS: 10.0000 mg | ORAL_TABLET | Freq: Every day | ORAL | 3 refills | Status: DC

## 2018-12-06 NOTE — Addendum Note (Signed)
Addended by: Abbe Amsterdam C on: 12/06/2018 06:16 PM   Modules accepted: Orders

## 2018-12-07 ENCOUNTER — Encounter: Payer: Self-pay | Admitting: Family Medicine

## 2018-12-07 MED ORDER — AMLODIPINE BESYLATE 10 MG PO TABS: 10.0000 mg | ORAL_TABLET | Freq: Every day | ORAL | 6 refills | Status: DC

## 2018-12-07 NOTE — Addendum Note (Signed)
Addended by: Abbe Amsterdam C on: 12/07/2018 08:42 AM   Modules accepted: Orders

## 2018-12-10 MED FILL — AMLODIPINE BESYLATE 10 MG T: 10 | 30 days supply | Qty: 30 | Fill #0

## 2019-01-04 NOTE — Progress Notes (Addendum)
[facility] [address], [address] [place], [place] [address] [phone]  Date:  01/09/2019   Name:  Katie Johnson   DOB:  February 09, 1936   MRN:  [id number]  PCP:  Pearline Cables, MD    Chief Complaint: Medication Refill   History of Present Illness:  Katie Johnson is a 83 y.o. very pleasant female patient who presents with the following:  Following up on medications today History of CAD, HTN, CKD  I saw her last month; her BP was running a bit low .  Also potassium was high and her renal function was worsening We stopped her amlodipine/ benazepril and put her on plain amlodipine   immun are UTD   BP Readings from Last 3 Encounters:  01/09/19 122/74  12/05/18 112/68  11/15/18 110/70   Not feeling faint or lightheaded  She feels like she is doing well on her current dose of amlodipine Patient Active Problem List   Diagnosis Date Noted  . Acute respiratory failure with hypoxia (HCC) 10/03/2018  . CAP (community acquired pneumonia) 10/03/2018  . Hyponatremia 10/03/2018  . CKD (chronic kidney disease) stage 3, GFR 30-59 ml/min (HCC) 10/03/2018  . Generalized weakness 10/03/2018  . Hypophosphatemia 10/03/2018  . Abnormal LFTs 10/03/2018  . Macrocytic anemia 10/03/2018  . Sepsis (HCC) 09/30/2018  . AC (acromioclavicular) arthritis 12/21/2017  . Essential hypertension 11/28/2017  . Degenerative arthritis of knee, bilateral 07/15/2015  . De Quervain's disease (tenosynovitis) 09/15/2011  . METATARSALGIA 07/14/2010  . BUNIONS, BILATERAL 07/14/2010  . FOOT PAIN, BILATERAL 07/14/2010  . BACK PAIN, THORACIC REGION 03/30/2010    Past Medical History:  Diagnosis Date  . Arthritis   . Chicken pox   . GERD (gastroesophageal reflux disease)   . High blood pressure   . Hypertension     Past Surgical History:  Procedure Laterality Date  . COLONOSCOPY     Dr Jeani Hawking before 2010 per patient   .  ESOPHAGOGASTRODUODENOSCOPY     over 20 years ago    Social History   Tobacco Use  . Smoking status: Never Smoker  . Smokeless tobacco: Never Used  Substance Use Topics  . Alcohol use: Not Currently  . Drug use: Never    Family History  Problem Relation Age of Onset  . COPD Mother   . Heart disease Mother   . Diabetes Father   . Heart disease Father   . Colon cancer Neg Hx   . Esophageal cancer Neg Hx     Allergies  Allergen Reactions  . Shellfish-Derived Products Anaphylaxis  . Statins Diarrhea and Nausea And Vomiting  . Pravastatin Diarrhea  . Ketoprofen Rash    Medication list has been reviewed and updated.  Current Outpatient Medications on File Prior to Visit  Medication Sig Dispense Refill  . amLODipine (NORVASC) 10 MG tablet Take 1 tablet (10 mg total) by mouth daily. 30 tablet 6  . cholecalciferol (VITAMIN D) 1000 units tablet Take 2,000 Units by mouth daily.    . Multiple Vitamins-Minerals (CENTRUM ADULTS PO) Take by mouth.     No current facility-administered medications on file prior to visit.     Review of Systems:  As per HPI- otherwise negative. Feels well  Physical Examination: Vitals:   01/09/19 1450  BP: 122/74  Pulse: 74  Resp: 16  Temp: 98.3 F (36.8 C)  SpO2: 99%   Vitals:   01/09/19 1450  Weight: 166  lb (75.3 kg)  Height: 5\' 4"  (1.626 m)   Body mass index is 28.49 kg/m. Ideal Body Weight: Weight in (lb) to have BMI = 25: 145.3  GEN: WDWN, NAD, Non-toxic, A & O x 3, looks well  HEENT: Atraumatic, Normocephalic. Neck supple. No masses, No LAD. Ears and Nose: No external deformity. CV: RRR, No M/G/R. No JVD. No thrill. No extra heart sounds. PULM: CTA B, no wheezes, crackles, rhonchi. No retractions. No resp. distress. No accessory muscle use. EXTR: No c/c.  Minimal - trace- edema of both legs  NEURO Normal gait.  PSYCH: Normally interactive. Conversant. Not depressed or anxious appearing.  Calm demeanor.    Assessment and  Plan: Acute renal insufficiency - Plan: Basic metabolic panel  Essential hypertension - Plan: amLODipine (NORVASC) 10 MG tablet  Here today to repeat her kidney function She is doing well with 10mg  of amlodipine which I refilled today. Will plan further follow- up pending labs.   Signed Abbe Amsterdam, MD  Addendum 3/5, received her labs.   1.51 BUN down from 37 Very good result, will send message to pt   Results for orders placed or performed in visit on 01/09/19  Basic metabolic panel  Result Value Ref Range   Sodium 138 135 - 145 mEq/L   Potassium 5.0 3.5 - 5.1 mEq/L   Chloride 102 96 - 112 mEq/L   CO2 29 19 - 32 mEq/L   Glucose, Bld 82 70 - 99 mg/dL   BUN 27 (H) 6 - 23 mg/dL   Creatinine, Ser 7.82 0.40 - 1.20 mg/dL   Calcium 9.6 8.4 - 42.3 mg/dL   GFR 53.61 (L) >44.31 mL/min

## 2019-01-09 ENCOUNTER — Encounter: Payer: Self-pay | Admitting: Family Medicine

## 2019-01-09 ENCOUNTER — Ambulatory Visit: Payer: Medicare Other | Admitting: Family Medicine

## 2019-01-09 VITALS — BP 122/74 | HR 74 | Temp 98.3°F | Resp 16 | Ht 64.0 in | Wt 166.0 lb

## 2019-01-09 DIAGNOSIS — I10 Essential (primary) hypertension: Secondary | ICD-10-CM | POA: Diagnosis not present

## 2019-01-09 DIAGNOSIS — N289 Disorder of kidney and ureter, unspecified: Secondary | ICD-10-CM | POA: Diagnosis not present

## 2019-01-09 MED ORDER — AMLODIPINE BESYLATE 10 MG PO TABS: 10.0000 mg | ORAL_TABLET | Freq: Every day | ORAL | 3 refills | Status: DC

## 2019-01-09 MED FILL — AMLODIPINE BESYLATE 10 MG T: 10 | 90 days supply | Qty: 90 | Fill #0

## 2019-01-09 NOTE — Patient Instructions (Addendum)
I will be in touch with your labs asap  Will check on your potassium and kidneys today  You might try compression socks for your legs

## 2019-01-10 ENCOUNTER — Encounter: Payer: Self-pay | Admitting: Family Medicine

## 2019-01-10 LAB — BASIC METABOLIC PANEL
BUN: 27 mg/dL — ABNORMAL HIGH (ref 6–23)
CO2: 29 mEq/L (ref 19–32)
Calcium: 9.6 mg/dL (ref 8.4–10.5)
Chloride: 102 mEq/L (ref 96–112)
Creatinine, Ser: 1.16 mg/dL (ref 0.40–1.20)
GFR: 44.88 mL/min — ABNORMAL LOW (ref >60.00)
Glucose, Bld: 82 mg/dL (ref 70–99)
Potassium: 5 mEq/L (ref 3.5–5.1)
Sodium: 138 mEq/L (ref 135–145)

## 2019-01-11 ENCOUNTER — Encounter: Payer: Self-pay | Admitting: Family Medicine

## 2019-01-16 ENCOUNTER — Other Ambulatory Visit: Payer: Self-pay | Admitting: Family Medicine

## 2019-01-16 DIAGNOSIS — I10 Essential (primary) hypertension: Secondary | ICD-10-CM

## 2019-01-18 ENCOUNTER — Other Ambulatory Visit: Payer: Self-pay

## 2019-01-23 ENCOUNTER — Ambulatory Visit: Payer: Medicare Other | Admitting: Family Medicine

## 2019-02-11 NOTE — Progress Notes (Signed)
[name] [address] [place], [place] [address] Phone: [phone] Subjective:   I Katie Johnson am serving as a Neurosurgeon for Dr. Antoine Primas.    CC: knee pain follow up    AXK:[id number]  Katie Johnson is a 83 y.o. female coming in with complaint of bilateral knee pain. Bilateral knee injection. Known to have severe osteoarthritic changes of the knees bilaterally.  Has been approved for Visco supplementation.  Patient states still some swelling.  Has not been able to do the exercise bike secondary to her assisted living facility being on lockdown.     Past Medical History:  Diagnosis Date  . Arthritis   . Chicken pox   . GERD (gastroesophageal reflux disease)   . High blood pressure   . Hypertension    Past Surgical History:  Procedure Laterality Date  . COLONOSCOPY     Dr Jeani Hawking before 2010 per patient   . ESOPHAGOGASTRODUODENOSCOPY     over 20 years ago   Social History   Socioeconomic History  . Marital status: Married    Spouse name: Not on file  . Number of children: Not on file  . Years of education: Not on file  . Highest education level: Not on file  Occupational History  . Occupation: Agricultural consultant at [employer]  . Financial resource strain: Not on file  . Food insecurity:    Worry: Not on file    Inability: Not on file  . Transportation needs:    Medical: Not on file    Non-medical: Not on file  Tobacco Use  . Smoking status: Never Smoker  . Smokeless tobacco: Never Used  Substance and Sexual Activity  . Alcohol use: Not Currently  . Drug use: Never  . Sexual activity: Not on file  Lifestyle  . Physical activity:    Days per week: Not on file    Minutes per session: Not on file  . Stress: Not on file  Relationships  . Social connections:    Talks on phone: Not on file    Gets together: Not on file    Attends religious service: Not on file    Active member of club or organization: Not on file   Attends meetings of clubs or organizations: Not on file    Relationship status: Not on file  Other Topics Concern  . Not on file  Social History Narrative  . Not on file   Allergies  Allergen Reactions  . Shellfish-Derived Products Anaphylaxis  . Statins Diarrhea and Nausea And Vomiting  . Pravastatin Diarrhea  . Ketoprofen Rash   Family History  Problem Relation Age of Onset  . COPD Mother   . Heart disease Mother   . Diabetes Father   . Heart disease Father   . Colon cancer Neg Hx   . Esophageal cancer Neg Hx      Current Outpatient Medications (Cardiovascular):  .  amLODipine (NORVASC) 10 MG tablet, Take 1 tablet (10 mg total) by mouth daily.     Current Outpatient Medications (Other):  .  cholecalciferol (VITAMIN D) 1000 units tablet, Take 2,000 Units by mouth daily. .  Multiple Vitamins-Minerals (CENTRUM ADULTS PO), Take by mouth.    Past medical history, social, surgical and family history all reviewed in electronic medical record.  No pertanent information unless stated regarding to the chief complaint.   Review of Systems:  No headache, visual changes, nausea, vomiting, diarrhea, constipation, dizziness,  abdominal pain, skin rash, fevers, chills, night sweats, weight loss, swollen lymph nodes, body aches,chest pain, shortness of breath, mood changes.  Positive muscle aches and joint swelling  Objective  Blood pressure 120/60, pulse 70, height 5\' 4"  (1.626 m), weight 166 lb (75.3 kg), SpO2 95 %.   General: No apparent distress alert and oriented x3 mood and affect normal, dressed appropriately.  HEENT: Pupils equal, extraocular movements intact  Respiratory: Patient's speak in full sentences and does not appear short of breath  Cardiovascular: Bilateral symmetric lower extremity swelling noted \.  Abdomen: Soft nontender  Neuro: Cranial nerves II through XII are intact, neurovascularly intact in all extremities with 2+ DTRs and 2+ pulses.  Lymph: No  lymphadenopathy of posterior or anterior cervical chain or axillae bilaterally.  Gait antalgic  MSK: Arthritic changes noted. Knee:bilateral  valgus deformity noted. Large thigh to calf ratio.  Tender to palpation over medial and PF joint line.  ROM full in flexion and extension and lower leg rotation. instability with valgus force.  painful patellar compression. Patellar glide with moderate crepitus. Patellar and quadriceps tendons unremarkable. Hamstring and quadriceps strength is normal.  After informed written and verbal consent, patient was seated on exam table. Right knee was prepped with alcohol swab and utilizing anterolateral approach, patient's right knee space was injected with60mg /18mL of durolene (sodium hyaluronate) in a prefilled syringe was injected easily into the knee through a 22-gauge needle..Patient tolerated the procedure well without immediate complications.  After informed written and verbal consent, patient was seated on exam table. Left knee was prepped with alcohol swab and utilizing anterolateral approach, patient's left knee space was injected with 60mg /28mL of durolene(sodium hyaluronate) in a prefilled syringe was injected easily into the knee through a 22-gauge needle..Patient tolerated the procedure well without immediate complications.    Impression and Recommendations:     This case required medical decision making of moderate complexity. The above documentation has been reviewed and is accurate and complete Judi Saa, DO       Note: This dictation was prepared with Dragon dictation along with smaller phrase technology. Any transcriptional errors that result from this process are unintentional.

## 2019-02-12 ENCOUNTER — Encounter: Payer: Self-pay | Admitting: Family Medicine

## 2019-02-12 ENCOUNTER — Other Ambulatory Visit: Payer: Self-pay

## 2019-02-12 ENCOUNTER — Ambulatory Visit: Payer: Medicare Other | Admitting: Family Medicine

## 2019-02-12 DIAGNOSIS — M170 Bilateral primary osteoarthritis of knee: Secondary | ICD-10-CM

## 2019-02-12 NOTE — Patient Instructions (Signed)
Good to see you  Ice is your friend Blondell Reveal injections today  Will take up to a month to work but expect it to do well  Can repeat every 6 months but if pain before this then call us please  Please stay safe and healthy!

## 2019-02-12 NOTE — Assessment & Plan Note (Signed)
Worsening symptoms overall.  Discussed with patient in great length.  Spent greater than 25 minutes face-to-face discussing patient's answers as well as different treatment options.  Patient wants to avoid all surgical intervention.  Will try the viscosupplementation today.  Warned that it may not have significant improvement for the next month.  Discussed icing regimen and home exercise.  Discussed which activities to do which wants to avoid.  Has topical anti-inflammatories at home.  Patient will not making a follow-up appointment at this time secondary to the coronavirus outbreak.  Patient is somewhat high risk.  Patient is in the agreement with the plan.  Will call if needs a sooner.

## 2019-02-19 ENCOUNTER — Ambulatory Visit: Payer: Medicare Other | Admitting: Family Medicine

## 2019-02-25 ENCOUNTER — Encounter: Payer: Self-pay | Admitting: Family Medicine

## 2019-02-25 ENCOUNTER — Other Ambulatory Visit: Payer: Self-pay

## 2019-02-25 ENCOUNTER — Ambulatory Visit (INDEPENDENT_AMBULATORY_CARE_PROVIDER_SITE_OTHER): Payer: Medicare Other | Admitting: Family Medicine

## 2019-02-25 VITALS — BP 140/78 | HR 78 | Temp 97.9°F | Resp 16 | Ht 64.0 in | Wt 170.0 lb

## 2019-02-25 DIAGNOSIS — I10 Essential (primary) hypertension: Secondary | ICD-10-CM

## 2019-02-25 DIAGNOSIS — R600 Localized edema: Secondary | ICD-10-CM

## 2019-02-25 NOTE — Patient Instructions (Addendum)
It was great to see you again today-we will get labs for you tomorrow and then figure out our next step  You can also try taking a 1/2 tablet of your amlodipine (5mg ) for the next few days

## 2019-02-25 NOTE — Telephone Encounter (Signed)
Spoke with pt and answered questions. I assured her that if there was any difficulty connecting with her PCP that our office would call her.

## 2019-02-25 NOTE — Progress Notes (Addendum)
[facility] [address], [address] [place], [place] [address] [phone] [phone]  Date:  02/25/2019   Name:  Katie Buttonnn B Benish   DOB:  06/18/1936   MRN:  [id number]  PCP:  Pearline Cablesopland, Calahan Pak C, MD    Chief Complaint: Hypertension (amlodipine side effects, leg swelling )   History of Present Illness:  Katie Johnson is a 83 y.o. very pleasant female patient who presents with the following:  Virtual visit today due to pandemic-however converted to inpatient visit due to patient complaint  Following up on her BP and lower extremity edema today  Last visit was about 7 weeks ago She notes that her legs are swelling- her calves have been swollen since last year , severity varies  at our last visit she had trace edema - she notes worsening for a month or so She is having a hard time getting her shoes on It may feel hot and be red around her calves  Her feet looks better in the am, but get worse as the day goes on  Her legs do feel sore  Sh tried some support hose- this just seemed to push the swelling up to the knee level No orthopnea Her weight is up a few pounds  Despite quarantine, she has still been active around her house.  She is up and walking quite a bit  She has tried some parsley tea as a home remedy type diuretic She takes her BP med in the am -10 mg of amlodipine  BP Readings from Last 3 Encounters:  02/25/19 140/78  02/12/19 120/60  01/09/19 122/74   No cough, fever or SOB She does have a home BP machine but it is old, she is not sure how well it works We had taken her off benazepril earlier this year due to renal insufficiency  Wt Readings from Last 3 Encounters:  02/25/19 170 lb (77.1 kg)  02/12/19 166 lb (75.3 kg)  01/09/19 166 lb (75.3 kg)     Patient Active Problem List   Diagnosis Date Noted  . Acute respiratory failure with hypoxia (HCC) 10/03/2018  . CAP (community acquired pneumonia) 10/03/2018  . Hyponatremia  10/03/2018  . CKD (chronic kidney disease) stage 3, GFR 30-59 ml/min (HCC) 10/03/2018  . Generalized weakness 10/03/2018  . Hypophosphatemia 10/03/2018  . Abnormal LFTs 10/03/2018  . Macrocytic anemia 10/03/2018  . Sepsis (HCC) 09/30/2018  . AC (acromioclavicular) arthritis 12/21/2017  . Essential hypertension 11/28/2017  . Degenerative arthritis of knee, bilateral 07/15/2015  . De Quervain's disease (tenosynovitis) 09/15/2011  . METATARSALGIA 07/14/2010  . BUNIONS, BILATERAL 07/14/2010  . FOOT PAIN, BILATERAL 07/14/2010  . BACK PAIN, THORACIC REGION 03/30/2010    Past Medical History:  Diagnosis Date  . Arthritis   . Chicken pox   . GERD (gastroesophageal reflux disease)   . High blood pressure   . Hypertension     Past Surgical History:  Procedure Laterality Date  . COLONOSCOPY     Dr Jeani HawkingPatrick Hung before 2010 per patient   . ESOPHAGOGASTRODUODENOSCOPY     over 20 years ago    Social History   Tobacco Use  . Smoking status: Never Smoker  . Smokeless tobacco: Never Used  Substance Use Topics  . Alcohol use: Not Currently  . Drug use: Never    Family History  Problem Relation Age of Onset  . COPD Mother   . Heart disease Mother   . Diabetes  Father   . Heart disease Father   . Colon cancer Neg Hx   . Esophageal cancer Neg Hx     Allergies  Allergen Reactions  . Shellfish-Derived Products Anaphylaxis  . Statins Diarrhea and Nausea And Vomiting  . Pravastatin Diarrhea  . Ketoprofen Rash    Medication list has been reviewed and updated.  Current Outpatient Medications on File Prior to Visit  Medication Sig Dispense Refill  . amLODipine (NORVASC) 10 MG tablet Take 1 tablet (10 mg total) by mouth daily. 90 tablet 3  . cholecalciferol (VITAMIN D) 1000 units tablet Take 2,000 Units by mouth daily.    . Multiple Vitamins-Minerals (CENTRUM ADULTS PO) Take by mouth.     No current facility-administered medications on file prior to visit.     Review of  Systems:  As per HPI- otherwise negative. No fever or chills, no chest pain or shortness of breath  Physical Examination: Vitals:   02/25/19 1605 02/25/19 1627  BP: (!) 144/78 140/78  Pulse: 78   Resp: 16   Temp: 97.9 F (36.6 C)   SpO2: 98%    Vitals:   02/25/19 1605  Weight: 170 lb (77.1 kg)  Height: 5\' 4"  (1.626 m)   Body mass index is 29.18 kg/m. Ideal Body Weight: Weight in (lb) to have BMI = 25: 145.3  GEN: WDWN, NAD, Non-toxic, A & O x 3, well-appearing older lady, overweight HEENT: Atraumatic, Normocephalic. Neck supple. No masses, No LAD. Ears and Nose: No external deformity. CV: RRR, No M/G/R. No JVD. No thrill. No extra heart sounds. PULM: CTA B, no wheezes, crackles, rhonchi. No retractions. No resp. distress. No accessory muscle use. EXTR: No c/c.  She has soft pitting edema to the mid to upper calf bilaterally.  Left is worse than right.  Feet are warm and well perfused with strong dorsalis pedis pulses.  No calf tenderness or cords NEURO Normal gait.  PSYCH: Normally interactive. Conversant. Not depressed or anxious appearing.  Calm demeanor.    Assessment and Plan: Bilateral leg edema - Plan: CBC, Comprehensive metabolic panel, B Nat Peptide  Essential hypertension  And is here today with leg edema, this has been present for some time but worse for the last month.  She is on amlodipine 10 mg, which may be contributing.  Would like to use a diuretic, but need to check her renal function first Her creatinine and GFR have been normal until this past December, when these unexpectedly went up. At her most recent blood draw her creatinine and GFR were improved  And will come in tomorrow to get her blood drawn, as the lab is currently closed She will look her blood pressure cuff Suggest that she halve her amlodipine dose for a few days, if she can monitor her blood pressure  Signed Abbe Amsterdam, MD  Received her labs 4/21 Kidney function continues to  improve Called pt and went over her labs  No sign of CHF BP 135/72 this am She halved her amlodipine today for the first time BP was higher this afternoon- about 150/80  Will have her add hctz 12.5 daily as needed for swelling Continue amlodipine reduction to 5 mg She will check BP twice a day email me with readings Repeat BMP in 2 weeks- lab only K is a bit high; repeat in 2 weeks, hctz should help     Chemistry      Component Value Date/Time   NA 136 02/26/2019 1110   K 5.5 (  H) 02/26/2019 1110   CL 99 02/26/2019 1110   CO2 30 02/26/2019 1110   BUN 26 (H) 02/26/2019 1110   CREATININE 1.07 02/26/2019 1110      Component Value Date/Time   CALCIUM 9.7 02/26/2019 1110   ALKPHOS 78 02/26/2019 1110   AST 16 02/26/2019 1110   ALT 18 02/26/2019 1110   BILITOT 0.5 02/26/2019 1110     Lab Results  Component Value Date   WBC 6.6 02/26/2019   HGB 13.4 02/26/2019   HCT 39.7 02/26/2019   MCV 96.5 02/26/2019   PLT 223.0 02/26/2019    ProBNP    Component Value Date/Time   PROBNP 94.0 02/26/2019 1110

## 2019-02-26 ENCOUNTER — Other Ambulatory Visit (INDEPENDENT_AMBULATORY_CARE_PROVIDER_SITE_OTHER): Payer: Medicare Other

## 2019-02-26 ENCOUNTER — Encounter: Payer: Self-pay | Admitting: Family Medicine

## 2019-02-26 DIAGNOSIS — R600 Localized edema: Secondary | ICD-10-CM

## 2019-02-26 LAB — COMPREHENSIVE METABOLIC PANEL
ALT: 18 U/L (ref 0–35)
AST: 16 U/L (ref 0–37)
Albumin: 4.2 g/dL (ref 3.5–5.2)
Alkaline Phosphatase: 78 U/L (ref 39–117)
BUN: 26 mg/dL — ABNORMAL HIGH (ref 6–23)
CO2: 30 mEq/L (ref 19–32)
Calcium: 9.7 mg/dL (ref 8.4–10.5)
Chloride: 99 mEq/L (ref 96–112)
Creatinine, Ser: 1.07 mg/dL (ref 0.40–1.20)
GFR: 49.25 mL/min — ABNORMAL LOW (ref >60.00)
Glucose, Bld: 83 mg/dL (ref 70–99)
Potassium: 5.5 mEq/L — ABNORMAL HIGH (ref 3.5–5.1)
Sodium: 136 mEq/L (ref 135–145)
Total Bilirubin: 0.5 mg/dL (ref 0.2–1.2)
Total Protein: 7.2 g/dL (ref 6.0–8.3)

## 2019-02-26 LAB — CBC
HCT: 39.7 % (ref 36.0–46.0)
Hemoglobin: 13.4 g/dL (ref 12.0–15.0)
MCHC: 33.7 g/dL (ref 30.0–36.0)
MCV: 96.5 fl (ref 78.0–100.0)
Platelets: 223 10*3/uL (ref 150.0–400.0)
RBC: 4.11 Mil/uL (ref 3.87–5.11)
RDW: 13.1 % (ref 11.5–15.5)
WBC: 6.6 10*3/uL (ref 4.0–10.5)

## 2019-02-26 LAB — BRAIN NATRIURETIC PEPTIDE: Pro B Natriuretic peptide (BNP): 94 pg/mL (ref 0.0–100.0)

## 2019-02-26 MED ORDER — HYDROCHLOROTHIAZIDE 12.5 MG PO TABS: 12.5000 mg | ORAL_TABLET | Freq: Every day | ORAL | 3 refills | Status: DC

## 2019-02-26 MED FILL — HYDROCHLOROTHIAZIDE 12.5 MG: 12.5 | 30 days supply | Qty: 30 | Fill #0

## 2019-02-26 NOTE — Addendum Note (Signed)
Addended by: Abbe Amsterdam C on: 02/26/2019 05:42 PM   Modules accepted: Orders

## 2019-03-08 ENCOUNTER — Encounter: Payer: Self-pay | Admitting: Family Medicine

## 2019-03-08 DIAGNOSIS — I10 Essential (primary) hypertension: Secondary | ICD-10-CM

## 2019-03-08 DIAGNOSIS — Z5181 Encounter for therapeutic drug level monitoring: Secondary | ICD-10-CM

## 2019-03-09 MED ORDER — AMLODIPINE BESYLATE 5 MG PO TABS: 5.0000 mg | ORAL_TABLET | Freq: Every day | ORAL | 3 refills | Status: DC

## 2019-03-09 NOTE — Addendum Note (Signed)
Addended by: Abbe Amsterdam C on: 03/09/2019 08:27 AM   Modules accepted: Orders

## 2019-03-11 MED FILL — AMLODIPINE BESYLATE 5 MG TA: 5 | 90 days supply | Qty: 90 | Fill #0

## 2019-03-13 ENCOUNTER — Other Ambulatory Visit: Payer: Self-pay

## 2019-03-13 ENCOUNTER — Other Ambulatory Visit (INDEPENDENT_AMBULATORY_CARE_PROVIDER_SITE_OTHER): Payer: Medicare Other

## 2019-03-13 DIAGNOSIS — Z5181 Encounter for therapeutic drug level monitoring: Secondary | ICD-10-CM | POA: Diagnosis not present

## 2019-03-13 LAB — BASIC METABOLIC PANEL
BUN: 30 mg/dL — ABNORMAL HIGH (ref 6–23)
CO2: 30 mEq/L (ref 19–32)
Calcium: 9.5 mg/dL (ref 8.4–10.5)
Chloride: 98 mEq/L (ref 96–112)
Creatinine, Ser: 1.13 mg/dL (ref 0.40–1.20)
GFR: 46.24 mL/min — ABNORMAL LOW (ref >60.00)
Glucose, Bld: 158 mg/dL — ABNORMAL HIGH (ref 70–99)
Potassium: 3.9 mEq/L (ref 3.5–5.1)
Sodium: 138 mEq/L (ref 135–145)

## 2019-03-15 ENCOUNTER — Encounter: Payer: Self-pay | Admitting: Family Medicine

## 2019-04-07 NOTE — Progress Notes (Signed)
[facility] [address] [place], [place] [address] Phone: [phone] Subjective:   I Katie Johnson am serving as a Neurosurgeon for Dr. Antoine Johnson.  I'm seeing this patient by the request  of:    CC: Knee pain follow-up  JFH:[id number]  Katie Johnson is a 83 y.o. female coming in with complaint of bilateral knee pain. States the knees are bad. She is not happy today. Would like answers as to why her knees are not getting better.  Patient feels with the knee pain is not making any significant improvement.  Has tried injections and Visco supplementation and physical therapy     Past Medical History:  Diagnosis Date  . Arthritis   . Chicken pox   . GERD (gastroesophageal reflux disease)   . High blood pressure   . Hypertension    Past Surgical History:  Procedure Laterality Date  . COLONOSCOPY     Dr Katie Johnson before 2010 per patient   . ESOPHAGOGASTRODUODENOSCOPY     over 20 years ago   Social History   Socioeconomic History  . Marital status: Married    Spouse name: Not on file  . Number of children: Not on file  . Years of education: Not on file  . Highest education level: Not on file  Occupational History  . Occupation: Agricultural consultant at [employer]  . Financial resource strain: Not on file  . Food insecurity:    Worry: Not on file    Inability: Not on file  . Transportation needs:    Medical: Not on file    Non-medical: Not on file  Tobacco Use  . Smoking status: Never Smoker  . Smokeless tobacco: Never Used  Substance and Sexual Activity  . Alcohol use: Not Currently  . Drug use: Never  . Sexual activity: Not on file  Lifestyle  . Physical activity:    Days per week: Not on file    Minutes per session: Not on file  . Stress: Not on file  Relationships  . Social connections:    Talks on phone: Not on file    Gets together: Not on file    Attends religious service: Not on file    Active member of club or organization:  Not on file    Attends meetings of clubs or organizations: Not on file    Relationship status: Not on file  Other Topics Concern  . Not on file  Social History Narrative  . Not on file   Allergies  Allergen Reactions  . Shellfish-Derived Products Anaphylaxis  . Statins Diarrhea and Nausea And Vomiting  . Pravastatin Diarrhea  . Ketoprofen Rash   Family History  Problem Relation Age of Onset  . COPD Mother   . Heart disease Mother   . Diabetes Father   . Heart disease Father   . Colon cancer Neg Hx   . Esophageal cancer Neg Hx      Current Outpatient Medications (Cardiovascular):  .  amLODipine (NORVASC) 5 MG tablet, Take 1 tablet (5 mg total) by mouth daily. .  hydrochlorothiazide (HYDRODIURIL) 12.5 MG tablet, Take 1 tablet (12.5 mg total) by mouth daily. Use as needed for swelling of legs     Current Outpatient Medications (Other):  .  cholecalciferol (VITAMIN D) 1000 units tablet, Take 2,000 Units by mouth daily. .  Multiple Vitamins-Minerals (CENTRUM ADULTS PO), Take by mouth. .  diclofenac sodium (VOLTAREN) 1 % GEL, Apply 2 g  topically 4 (four) times daily.    Past medical history, social, surgical and family history all reviewed in electronic medical record.  No pertanent information unless stated regarding to the chief complaint.   Review of Systems:  No headache, visual changes, nausea, vomiting, diarrhea, constipation, dizziness, abdominal pain, skin rash, fevers, chills, night sweats, weight loss, swollen lymph nodes, body aches, joint swelling,  chest pain, shortness of breath, mood changes.  Positive muscle aches  Objective  Blood pressure 140/80, pulse 68, height 5\' 4"  (1.626 m), weight 167 lb (75.8 kg), SpO2 98 %.    General: No apparent distress alert and oriented x3 mood and affect normal, dressed appropriately.  HEENT: Pupils equal, extraocular movements intact  Respiratory: Patient's speak in full sentences and does not appear short of breath   Cardiovascular: No lower extremity edema, non tender, no erythema  Skin: Warm dry intact with no signs of infection or rash on extremities or on axial skeleton.  Abdomen: Soft nontender  Neuro: Cranial nerves II through XII are intact, neurovascularly intact in all extremities with 2+ DTRs and 2+ pulses.  Lymph: No lymphadenopathy of posterior or anterior cervical chain or axillae bilaterally.  Gait antalgic gait MSK:  tender with limited range of motion and good stability and symmetric strength and tone of shoulders, elbows, wrist, hip, and ankles bilaterally.  Knee: Bilateral valgus deformity noted.  Abnormal thigh to calf ratio.  Left greater than right Tender to palpation over medial and PF joint line.  ROM f lacks last 5 degrees of extension bilaterally left greater than right though n. instability with valgus force.  painful patellar compression. Patellar glide with moderate crepitus. Patellar and quadriceps tendons unremarkable. Hamstring and quadriceps strength is normal.      Impression and Recommendations:     This case required medical decision making of moderate complexity. The above documentation has been reviewed and is accurate and complete Katie SaaZachary M Myishia Kasik, DO       Note: This dictation was prepared with Dragon dictation along with smaller phrase technology. Any transcriptional errors that result from this process are unintentional.

## 2019-04-08 ENCOUNTER — Other Ambulatory Visit: Payer: Self-pay

## 2019-04-08 ENCOUNTER — Ambulatory Visit (INDEPENDENT_AMBULATORY_CARE_PROVIDER_SITE_OTHER): Payer: Medicare Other | Admitting: Family Medicine

## 2019-04-08 ENCOUNTER — Encounter: Payer: Self-pay | Admitting: Family Medicine

## 2019-04-08 VITALS — BP 140/80 | HR 68 | Ht 64.0 in | Wt 167.0 lb

## 2019-04-08 DIAGNOSIS — M170 Bilateral primary osteoarthritis of knee: Secondary | ICD-10-CM

## 2019-04-08 DIAGNOSIS — M25562 Pain in left knee: Secondary | ICD-10-CM

## 2019-04-08 DIAGNOSIS — M25561 Pain in right knee: Secondary | ICD-10-CM | POA: Diagnosis not present

## 2019-04-08 DIAGNOSIS — G8929 Other chronic pain: Secondary | ICD-10-CM | POA: Diagnosis not present

## 2019-04-08 MED ORDER — DICLOFENAC SODIUM 1 % TD GEL: 2.0000 g | Freq: Four times a day (QID) | TRANSDERMAL | 3 refills | Status: DC

## 2019-04-08 NOTE — Assessment & Plan Note (Signed)
Patient feels with viscosupplementation nothing to make a significant improvement.  Patient does not feel like she will wear custom bracing on a regular basis.  Patient is considering surgical intervention.  Patient will be referred to orthopedic surgery to discuss.  We discussed if she decides not to do surgery we can continue with some of other possibilities including PRP but this would be more experimental.  Patient will follow-up with me again as needed.  Spent  25 minutes with patient face-to-face and had greater than 50% of counseling including as described above in assessment and plan.

## 2019-04-08 NOTE — Patient Instructions (Signed)
Good to se eyou  I want you to discuss with Dr. Jerl Santos on next steps and see if we should get the left knee fixed.  You have done great and stay active Consider HCTZ 12.5 mg or triamterene as possible other blood pressure medicines, but getting the knee fix may help  I am here if you have questions

## 2019-06-04 NOTE — Progress Notes (Signed)
[facility] [place], [place], [place] [address] [phone]  Date:  06/05/2019   Name:  Katie Johnson   DOB:  05/02/1936   MRN:  [id number]  PCP:  Darreld Mclean, MD    Chief Complaint: Hypertension (6 month follow up), Coronary Artery Disease (swelling in ankles), and Hip Pain (left hip pain, no known injury, dull ache)   History of Present Illness:  Katie Johnson is a 83 y.o. very pleasant female patient who presents with the following:  Here today for periodic follow-up visit I saw her virtually in April, we did do labs at that time She has mild CKD, which we have been monitoring Also history of hypertension, CAD  Last November she was hospitalized for about a week with CAP and sepsis-thankfully she recovered  Can suggest a DEXA scan Mammogram due next month Immunizations are up-to-date and complete, including Shingrix  Current blood pressure medication is Amlodipine 5 hctz 12.5 prn for swelling She does check her BP at home- tends to run 125- 150/60- 70s She is frustrated by persistent swelling in her feet, like related to amlodipine.  She wonders if we could switch her medication She was on benazepril and amlodipine previously, benazepril was stopped when her kidney function dropped  Her left hip is sore - no known injury, but she did play golf shortly before the pain got worse  She has noted this for about 2 months  Her knees are painful- she had synvisc but it did not seem to help her  She was told that a knee replacement would not be to her benefit She has a hard time getting up from seated due to her hip pain Worse with weather change   Wt Readings from Last 3 Encounters:  06/05/19 168 lb (76.2 kg)  04/08/19 167 lb (75.8 kg)  02/25/19 170 lb (77.1 kg)   BP Readings from Last 3 Encounters:  06/05/19 126/80  04/08/19 140/80  02/25/19 140/78    Patient Active Problem List   Diagnosis Date  Noted  . Acute respiratory failure with hypoxia ([facility]) 10/03/2018  . CAP (community acquired pneumonia) 10/03/2018  . Hyponatremia 10/03/2018  . CKD (chronic kidney disease) stage 3, GFR 30-59 ml/min (HCC) 10/03/2018  . Generalized weakness 10/03/2018  . Hypophosphatemia 10/03/2018  . Abnormal LFTs 10/03/2018  . Macrocytic anemia 10/03/2018  . Sepsis ([facility]) 09/30/2018  . AC (acromioclavicular) arthritis 12/21/2017  . Essential hypertension 11/28/2017  . Degenerative arthritis of knee, bilateral 07/15/2015  . De Quervain's disease (tenosynovitis) 09/15/2011  . METATARSALGIA 07/14/2010  . BUNIONS, BILATERAL 07/14/2010  . FOOT PAIN, BILATERAL 07/14/2010  . BACK PAIN, THORACIC REGION 03/30/2010    Past Medical History:  Diagnosis Date  . Arthritis   . Chicken pox   . GERD (gastroesophageal reflux disease)   . High blood pressure   . Hypertension     Past Surgical History:  Procedure Laterality Date  . COLONOSCOPY     Dr Carol Ada before 2010 per patient   . ESOPHAGOGASTRODUODENOSCOPY     over 20 years ago    Social History   Tobacco Use  . Smoking status: Never Smoker  . Smokeless tobacco: Never Used  Substance Use Topics  . Alcohol use: Not Currently  . Drug use: Never    Family History  Problem Relation Age of Onset  . COPD Mother   . Heart disease Mother   .  Diabetes Father   . Heart disease Father   . Colon cancer Neg Hx   . Esophageal cancer Neg Hx     Allergies  Allergen Reactions  . Shellfish-Derived Products Anaphylaxis  . Statins Diarrhea and Nausea And Vomiting  . Pravastatin Diarrhea  . Ketoprofen Rash    Medication list has been reviewed and updated.  Current Outpatient Medications on File Prior to Visit  Medication Sig Dispense Refill  . cholecalciferol (VITAMIN D) 1000 units tablet Take 2,000 Units by mouth daily.    . diclofenac sodium (VOLTAREN) 1 % GEL Apply 2 g topically 4 (four) times daily. 1 Tube 3  . Multiple Vitamins-Minerals  (CENTRUM ADULTS PO) Take by mouth.    . hydrochlorothiazide (HYDRODIURIL) 12.5 MG tablet Take 1 tablet (12.5 mg total) by mouth daily. Use as needed for swelling of legs (Patient not taking: Reported on 06/05/2019) 30 tablet 3   No current facility-administered medications on file prior to visit.     Review of Systems:  As per HPI- otherwise negative. No fever or chills, no chest pain or shortness of breath  Physical Examination: Vitals:   06/05/19 1537  BP: 126/80  Pulse: 73  Resp: 16  Temp: 98.1 F (36.7 C)  SpO2: 95%   Vitals:   06/05/19 1537  Weight: 168 lb (76.2 kg)  Height: 5\' 4"  (1.626 m)   Body mass index is 28.84 kg/m. Ideal Body Weight: Weight in (lb) to have BMI = 25: 145.3  GEN: WDWN, NAD, Non-toxic, A & O x 3, well-appearing lady, very spry for her age HEENT: Atraumatic, Normocephalic. Neck supple. No masses, No LAD. Ears and Nose: No external deformity. CV: RRR, No M/G/R. No JVD. No thrill. No extra heart sounds. PULM: CTA B, no wheezes, crackles, rhonchi. No retractions. No resp. distress. No accessory muscle use. ABD: S, NT, ND, +BS. No rebound. No HSM. EXTR: No c/c/mild swelling of both feet, migrated to amlodipine NEURO normal gait, but has some difficulty stepping up on her back hip Right hip displays normal range of motion, no pain Left hip displays pain with abduction and abduction of the flexed hip PSYCH: Normally interactive. Conversant. Not depressed or anxious appearing.  Calm demeanor.    Assessment and Plan:   ICD-10-CM   1. Essential hypertension  I10 benazepril (LOTENSIN) 5 MG tablet  2. Left hip pain  M25.552 DG HIP UNILAT W OR W/O PELVIS 2-3 VIEWS LEFT   Following up today on blood pressure and hip pain Her blood pressure is reasonable, she describes some sensation of low blood pressure when she may feel lightheaded We will try stopping amlodipine, will change to a low dose of benazepril. I advised her that we like to keep her blood  pressure under 150/90.  If she is clearly above this range on 5 mg she may go ahead and increase to 10 mg of benazepril I encouraged her to keep me closely updated regarding her blood pressure  Suspect arthritis of the left hip, will get films today  Follow-up: No follow-ups on file.  Meds ordered this encounter  Medications  . benazepril (LOTENSIN) 5 MG tablet    Sig: Take 1 tablet (5 mg total) by mouth daily. Increase to 10 mg if directed by MD    Dispense:  60 tablet    Refill:  5   Orders Placed This Encounter  Procedures  . DG HIP UNILAT W OR W/O PELVIS 2-3 VIEWS LEFT    @SIGN @  Signed Abbe AmsterdamJessica Kyra Laffey, MD  Received her hip films. Message to pt  Dg Hip Unilat W Or W/o Pelvis 2-3 Views Left  Result Date: 06/05/2019 CLINICAL DATA:  Left hip pain for the past 2 months. EXAM: DG HIP (WITH OR WITHOUT PELVIS) 2-3V LEFT COMPARISON:  None. FINDINGS: No fracture or dislocation. Moderate degenerative change the right hip with joint space loss and subchondral sclerosis. No evidence of avascular necrosis. Limited visualization of the contralateral right hip demonstrates mild degenerative change, incompletely evaluated. Limited visualization of the adjacent pelvis is normal. Multiple punctate phleboliths overlie the lower pelvis bilaterally. Regional soft tissues appear normal. IMPRESSION: Moderate degenerative change of the left hip. Electronically Signed   By: Simonne ComeJohn  Watts M.D.   On: 06/05/2019 16:44

## 2019-06-05 ENCOUNTER — Encounter: Payer: Self-pay | Admitting: Family Medicine

## 2019-06-05 ENCOUNTER — Ambulatory Visit (INDEPENDENT_AMBULATORY_CARE_PROVIDER_SITE_OTHER): Payer: Medicare Other | Admitting: Family Medicine

## 2019-06-05 ENCOUNTER — Ambulatory Visit (HOSPITAL_BASED_OUTPATIENT_CLINIC_OR_DEPARTMENT_OTHER)
Admission: RE | Admit: 2019-06-05 | Discharge: 2019-06-05 | Disposition: A | Discharge status: Home / Self Care | Payer: Medicare Other | Source: Ambulatory Visit | Attending: Family Medicine | Admitting: Family Medicine

## 2019-06-05 ENCOUNTER — Other Ambulatory Visit: Payer: Self-pay

## 2019-06-05 VITALS — BP 126/80 | HR 73 | Temp 98.1°F | Resp 16 | Ht 64.0 in | Wt 168.0 lb

## 2019-06-05 DIAGNOSIS — I10 Essential (primary) hypertension: Secondary | ICD-10-CM

## 2019-06-05 DIAGNOSIS — M25552 Pain in left hip: Secondary | ICD-10-CM

## 2019-06-05 MED ORDER — BENAZEPRIL HCL 5 MG PO TABS: 5.0000 mg | ORAL_TABLET | Freq: Every day | ORAL | 5 refills | Status: DC

## 2019-06-05 MED FILL — BENAZEPRIL HCL 5 MG TABLET: 5 | 30 days supply | Qty: 60 | Fill #0

## 2019-06-05 NOTE — Patient Instructions (Signed)
Good to see you today!  Please have a left hip x-ray on the ground floor- I will be in touch with your reports asap Let's try changing your BP medication from amlodipine to benazepril. Start with 5 mg- we may go up to 10 depending on how your BP responds.  Please check your BP at home and let me know how your numbers look

## 2019-06-11 ENCOUNTER — Telehealth: Payer: Self-pay | Admitting: Family Medicine

## 2019-06-11 ENCOUNTER — Emergency Department (HOSPITAL_BASED_OUTPATIENT_CLINIC_OR_DEPARTMENT_OTHER)
Admission: EM | Admit: 2019-06-11 | Discharge: 2019-06-11 | Disposition: A | Discharge status: Home / Self Care | Payer: Medicare Other | Attending: Emergency Medicine | Admitting: Emergency Medicine

## 2019-06-11 ENCOUNTER — Other Ambulatory Visit: Payer: Self-pay

## 2019-06-11 ENCOUNTER — Encounter (HOSPITAL_BASED_OUTPATIENT_CLINIC_OR_DEPARTMENT_OTHER): Payer: Self-pay | Admitting: Emergency Medicine

## 2019-06-11 DIAGNOSIS — N183 Chronic kidney disease, stage 3 (moderate): Secondary | ICD-10-CM | POA: Insufficient documentation

## 2019-06-11 DIAGNOSIS — Z79899 Other long term (current) drug therapy: Secondary | ICD-10-CM | POA: Insufficient documentation

## 2019-06-11 DIAGNOSIS — R42 Dizziness and giddiness: Secondary | ICD-10-CM | POA: Insufficient documentation

## 2019-06-11 DIAGNOSIS — I129 Hypertensive chronic kidney disease with stage 1 through stage 4 chronic kidney disease, or unspecified chronic kidney disease: Secondary | ICD-10-CM | POA: Insufficient documentation

## 2019-06-11 LAB — CBC
HCT: 41.6 % (ref 36.0–46.0)
Hemoglobin: 13.5 g/dL (ref 12.0–15.0)
MCH: 32 pg (ref 26.0–34.0)
MCHC: 32.5 g/dL (ref 30.0–36.0)
MCV: 98.6 fL (ref 80.0–100.0)
Platelets: 183 10*3/uL (ref 150–400)
RBC: 4.22 MIL/uL (ref 3.87–5.11)
RDW: 12.5 % (ref 11.5–15.5)
WBC: 8.5 10*3/uL (ref 4.0–10.5)
nRBC: 0 % (ref 0.0–0.2)

## 2019-06-11 LAB — URINALYSIS, ROUTINE W REFLEX MICROSCOPIC
Bilirubin Urine: NEGATIVE
Glucose, UA: NEGATIVE mg/dL
Hgb urine dipstick: NEGATIVE
Ketones, ur: NEGATIVE mg/dL
Leukocytes,Ua: NEGATIVE
Nitrite: NEGATIVE
Protein, ur: NEGATIVE mg/dL
Specific Gravity, Urine: <1.005 — ABNORMAL LOW (ref 1.005–1.030)
pH: 6.5 (ref 5.0–8.0)

## 2019-06-11 LAB — BASIC METABOLIC PANEL
Anion gap: 12 (ref 5–15)
BUN: 22 mg/dL (ref 8–23)
CO2: 23 mmol/L (ref 22–32)
Calcium: 9.5 mg/dL (ref 8.9–10.3)
Chloride: 103 mmol/L (ref 98–111)
Creatinine, Ser: 0.99 mg/dL (ref 0.44–1.00)
GFR calc Af Amer: >60 mL/min (ref >60)
GFR calc non Af Amer: 53 mL/min — ABNORMAL LOW (ref >60)
Glucose, Bld: 108 mg/dL — ABNORMAL HIGH (ref 70–99)
Potassium: 4.3 mmol/L (ref 3.5–5.1)
Sodium: 138 mmol/L (ref 135–145)

## 2019-06-11 LAB — CBG MONITORING, ED: Glucose-Capillary: 93 mg/dL (ref 70–99)

## 2019-06-11 LAB — TROPONIN I (HIGH SENSITIVITY): Troponin I (High Sensitivity): 8 ng/L (ref <18)

## 2019-06-11 MED ORDER — MECLIZINE HCL 25 MG PO TABS: 25.0000 mg | ORAL_TABLET | Freq: Three times a day (TID) | ORAL | 0 refills | Status: DC | PRN

## 2019-06-11 MED ORDER — SODIUM CHLORIDE 0.9 % IV BOLUS
1000.0000 mL | Freq: Once | INTRAVENOUS | Status: AC
  Administered 2019-06-11: 1000 mL via INTRAVENOUS

## 2019-06-11 MED ORDER — MECLIZINE HCL 25 MG PO TABS
25.0000 mg | ORAL_TABLET | Freq: Once | ORAL | Status: AC
  Administered 2019-06-11: 14:00:00 25 mg via ORAL
  Filled 2019-06-11: qty 1

## 2019-06-11 MED FILL — MECLIZINE 25 MG TABLET: 25 | 10 days supply | Qty: 30 | Fill #0

## 2019-06-11 NOTE — ED Provider Notes (Signed)
[facility] EMERGENCY DEPARTMENT Provider Note   CSN: [id number] Arrival date & time: 06/11/19  1240    History   Chief Complaint Chief Complaint  Patient presents with  . Dizziness    HPI Katie Johnson is a 83 y.o. female.     83 yo F with chief complaints of dizziness.  Worse upon standing.  She feels the at the sensation of feeling unsteady.  Also feels lightheaded at times.  Worse with walking as well.  Denies nausea vomiting or diarrhea denies decreased oral intake denies dark stool or blood in her stool.  She denies headache or neck pain.  Denies head injury.  Denies unilateral numbness or weakness denies difficulty with speech or swallowing.  The patient had recently a change her blood pressure medicines.  She went from and amlodipine Benzapril combination pill to just Benzapril.  This was about a week ago.  Her symptoms of dizziness started today.  The history is provided by the patient.  Dizziness Quality:  Imbalance Severity:  Moderate Onset quality:  Gradual Duration:  2 days Timing:  Constant Progression:  Worsening Chronicity:  New Context: standing up   Relieved by:  Nothing Worsened by:  Nothing Ineffective treatments:  None tried Associated symptoms: no chest pain, no headaches, no nausea, no palpitations, no shortness of breath and no vomiting     Past Medical History:  Diagnosis Date  . Arthritis   . Chicken pox   . GERD (gastroesophageal reflux disease)   . High blood pressure   . Hypertension     Patient Active Problem List   Diagnosis Date Noted  . Acute respiratory failure with hypoxia (HCC) 10/03/2018  . CAP (community acquired pneumonia) 10/03/2018  . Hyponatremia 10/03/2018  . CKD (chronic kidney disease) stage 3, GFR 30-59 ml/min (HCC) 10/03/2018  . Generalized weakness 10/03/2018  . Hypophosphatemia 10/03/2018  . Abnormal LFTs 10/03/2018  . Macrocytic anemia 10/03/2018  . Sepsis (HCC) 09/30/2018  . AC (acromioclavicular)  arthritis 12/21/2017  . Essential hypertension 11/28/2017  . Degenerative arthritis of knee, bilateral 07/15/2015  . De Quervain's disease (tenosynovitis) 09/15/2011  . METATARSALGIA 07/14/2010  . BUNIONS, BILATERAL 07/14/2010  . FOOT PAIN, BILATERAL 07/14/2010  . BACK PAIN, THORACIC REGION 03/30/2010    Past Surgical History:  Procedure Laterality Date  . COLONOSCOPY     Dr Jeani HawkingPatrick Hung before 2010 per patient   . ESOPHAGOGASTRODUODENOSCOPY     over 20 years ago     OB History   No obstetric history on file.      Home Medications    Prior to Admission medications   Medication Sig Start Date End Date Taking? Authorizing Provider  benazepril (LOTENSIN) 5 MG tablet Take 1 tablet (5 mg total) by mouth daily. Increase to 10 mg if directed by MD 06/05/19   Copland, Gwenlyn FoundJessica C, MD  cholecalciferol (VITAMIN D) 1000 units tablet Take 2,000 Units by mouth daily.    [provider]  diclofenac sodium (VOLTAREN) 1 % GEL Apply 2 g topically 4 (four) times daily. 04/08/19   Judi SaaSmith, Zachary M, DO  hydrochlorothiazide (HYDRODIURIL) 12.5 MG tablet Take 1 tablet (12.5 mg total) by mouth daily. Use as needed for swelling of legs Patient not taking: Reported on 06/05/2019 02/26/19   Copland, Gwenlyn FoundJessica C, MD  meclizine (ANTIVERT) 25 MG tablet Take 1 tablet (25 mg total) by mouth 3 (three) times daily as needed for dizziness. 06/11/19   Melene PlanFloyd, Micaila Ziemba, DO  Multiple Vitamins-Minerals (CENTRUM ADULTS PO)  Take by mouth.    [provider]    Family History Family History  Problem Relation Age of Onset  . COPD Mother   . Heart disease Mother   . Diabetes Father   . Heart disease Father   . Colon cancer Neg Hx   . Esophageal cancer Neg Hx     Social History Social History   Tobacco Use  . Smoking status: Never Smoker  . Smokeless tobacco: Never Used  Substance Use Topics  . Alcohol use: Not Currently  . Drug use: Never     Allergies   Shellfish-derived products, Statins,  Amlodipine, Pravastatin, and Ketoprofen   Review of Systems Review of Systems  Constitutional: Negative for chills and fever.  HENT: Negative for congestion and rhinorrhea.   Eyes: Negative for redness and visual disturbance.  Respiratory: Negative for shortness of breath and wheezing.   Cardiovascular: Negative for chest pain and palpitations.  Gastrointestinal: Negative for nausea and vomiting.  Genitourinary: Negative for dysuria and urgency.  Musculoskeletal: Negative for arthralgias and myalgias.  Skin: Negative for pallor and wound.  Neurological: Positive for dizziness. Negative for headaches.     Physical Exam Updated Vital Signs BP (!) 179/88   Pulse 63   Temp 98.6 F (37 C) (Oral)   Resp 15   Ht 5\' 4"  (1.626 m)   Wt 76.2 kg   SpO2 100%   BMI 28.84 kg/m   Physical Exam Vitals signs and nursing note reviewed.  Constitutional:      General: She is not in acute distress.    Appearance: She is well-developed. She is not diaphoretic.  HENT:     Head: Normocephalic and atraumatic.  Eyes:     Pupils: Pupils are equal, round, and reactive to light.  Neck:     Musculoskeletal: Normal range of motion and neck supple.  Cardiovascular:     Rate and Rhythm: Normal rate and regular rhythm.     Heart sounds: No murmur. No friction rub. No gallop.   Pulmonary:     Effort: Pulmonary effort is normal.     Breath sounds: No wheezing or rales.  Abdominal:     General: There is no distension.     Palpations: Abdomen is soft.     Tenderness: There is no abdominal tenderness.  Musculoskeletal:        General: No tenderness.  Skin:    General: Skin is warm and dry.  Neurological:     Mental Status: She is alert and oriented to person, place, and time.     Cranial Nerves: Cranial nerves are intact.     Sensory: Sensation is intact.     Motor: Motor function is intact.     Coordination: Coordination is intact.     Comments: Patient is somewhat symptomatic upon standing.   She is able to ambulate independently.  Left-sided fast-growing nystagmus.  Psychiatric:        Behavior: Behavior normal.      ED Treatments / Results  Labs (all labs ordered are listed, but only abnormal results are displayed) Labs Reviewed  BASIC METABOLIC PANEL - Abnormal; Notable for the following components:      Result Value   Glucose, Bld 108 (*)    GFR calc non Af Amer 53 (*)    All other components within normal limits  URINALYSIS, ROUTINE W REFLEX MICROSCOPIC - Abnormal; Notable for the following components:   Specific Gravity, Urine <1.005 (*)    All other  components within normal limits  CBC  CBG MONITORING, ED  TROPONIN I (HIGH SENSITIVITY)  TROPONIN I (HIGH SENSITIVITY)    EKG EKG Interpretation  Date/Time:  Tuesday June 11 2019 12:58:27 EDT Ventricular Rate:  54 PR Interval:    QRS Duration: 106 QT Interval:  409 QTC Calculation: 388 R Axis:   64 Text Interpretation:  Sinus rhythm No significant change since last tracing Confirmed by Deno Etienne [phone]) on 06/11/2019 1:13:31 PM   Radiology No results found.  Procedures Procedures (including critical care time)  Medications Ordered in ED Medications  sodium chloride 0.9 % bolus 1,000 mL (1,000 mLs Intravenous New Bag/Given 06/11/19 1422)  meclizine (ANTIVERT) tablet 25 mg (25 mg Oral Given 06/11/19 1425)     Initial Impression / Assessment and Plan / ED Course  I have reviewed the triage vital signs and the nursing notes.  Pertinent labs & imaging results that were available during my care of the patient were reviewed by me and considered in my medical decision making (see chart for details).        83 yo F with a chief complaint of dizziness.  Described as an unsteadiness on her feet.  She also has a new significant hypertension.  Will give a bolus of IV fluids check lab work give meclizine and reassess.  Patient completely asymptomatic on repeat exam.  She is able to ambulate to the bathroom  and back without difficulty.  I again discussed the limitation of diagnosing a stroke without an MRI.  Patient at this point would rather go home.  She will follow-up with her PCP and discuss an outpatient MRI and maybe outpatient neurology follow-up.  Return for worsening symptoms.  3:15 PM:  I have discussed the diagnosis/risks/treatment options with the patient and family and believe the pt to be eligible for discharge home to follow-up with PCP. We also discussed returning to the ED immediately if new or worsening sx occur. We discussed the sx which are most concerning (e.g., sudden worsening dizziness, inability to ambulate, one-sided numbness weakness difficulty with speech or swallowing.) that necessitate immediate return. Medications administered to the patient during their visit and any new prescriptions provided to the patient are listed below.  Medications given during this visit Medications  sodium chloride 0.9 % bolus 1,000 mL (1,000 mLs Intravenous New Bag/Given 06/11/19 1422)  meclizine (ANTIVERT) tablet 25 mg (25 mg Oral Given 06/11/19 1425)     The patient appears reasonably screen and/or stabilized for discharge and I doubt any other medical condition or other [facility] requiring further screening, evaluation, or treatment in the ED at this time prior to discharge.    Final Clinical Impressions(s) / ED Diagnoses   Final diagnoses:  Dizziness    ED Discharge Orders         Ordered    meclizine (ANTIVERT) 25 MG tablet  3 times daily PRN     06/11/19 1443           Deno Etienne, DO 06/11/19 1515

## 2019-06-11 NOTE — Discharge Instructions (Signed)
Your lab work was unremarkable.  Your symptoms are somewhat concerning for a possible stroke.  The only way to rule this out is with an MRI.  Please discuss this with your family doctor and see what they recommend.  As your symptoms are gotten significantly better this may be due to you being slightly dehydrated.  Eat and drink well for the next couple days.  Please return to the ED at any time would like to be reevaluated or if you start show other signs of a stroke one-sided numbness or weakness difficulty with speech or swallowing or if you are unable to ambulate.

## 2019-06-11 NOTE — ED Triage Notes (Signed)
Pt states she recently had BP medication change one week ago.  This morning she had some dizziness and noted that her BP was higher than usual.  Denies chest pain or SOB.  Denies room spinning, double vision or syncope.

## 2019-06-11 NOTE — Telephone Encounter (Signed)
Blood pressure checked with machine here.  Blood pressure was 209/95 pulse 63.  Patient stated that she has only dizziness, no chest pain, no SOB, and no numbness or tingling.  Per Dr. Nani Ravens patient needs to be seen.  We do not have any openings today and Ekg machine is down.  Patient sent to ED for evaluation.

## 2019-06-11 NOTE — Telephone Encounter (Signed)
Patient came into the office to seek assistance/guidence over high BP readings. She stated her last BP reading was 206/94.

## 2019-06-11 NOTE — ED Notes (Signed)
ED Provider at bedside. 

## 2019-06-12 ENCOUNTER — Other Ambulatory Visit: Payer: Self-pay

## 2019-06-12 ENCOUNTER — Telehealth: Payer: Self-pay

## 2019-06-12 NOTE — Telephone Encounter (Signed)
Scheduled appt.

## 2019-06-12 NOTE — Telephone Encounter (Signed)
Could someone please call Katie Johnson and get Katie Johnson in to see Dr. Lorelei Pont on Monday for high blood pressure as this is the first availability. Thank you!

## 2019-06-15 NOTE — Progress Notes (Signed)
[facility] [address], [address] [place], [place] [address] [phone] [phone]  Date:  06/17/2019   Name:  Katie Buttonnn B Henricks   DOB:  11/06/1936   MRN:  [id number]  PCP:  Pearline Cablesopland, Ivery Nanney C, MD    Chief Complaint: ER follow up and Hypertension   History of Present Illness:  Katie Johnson is a 83 y.o. very pleasant female patient who presents with the following:  Pt with history of CKD, HTN Here today to follow-up from recent ER visit for HTN I saw her on 7/29 at which time her BP was normal- on amlodipine 5 and hctz 12.5 prn for swelling She was having some LE edema on amlodipine so we tried changing her over to benazepril   Her BP then went way up and she got dizzy, she was directed to go to the ER and was seen on 8/4- evaluated and allowed to go home after fluid bolus  They did not change her BP meds in the ER as her BP was ok again   Her kidney function was quite normal in the ER  ?did they adjust her BP meds in ER  She is using some lemon water in the morning and this seems to help with her swelling She is now back on amlipdine 10 mg- as long as she does the lemon water she does not swell and is tolerating this well  Patient Active Problem List   Diagnosis Date Noted  . Acute respiratory failure with hypoxia (HCC) 10/03/2018  . CAP (community acquired pneumonia) 10/03/2018  . Hyponatremia 10/03/2018  . CKD (chronic kidney disease) stage 3, GFR 30-59 ml/min (HCC) 10/03/2018  . Generalized weakness 10/03/2018  . Hypophosphatemia 10/03/2018  . Abnormal LFTs 10/03/2018  . Macrocytic anemia 10/03/2018  . Sepsis (HCC) 09/30/2018  . AC (acromioclavicular) arthritis 12/21/2017  . Essential hypertension 11/28/2017  . Degenerative arthritis of knee, bilateral 07/15/2015  . De Quervain's disease (tenosynovitis) 09/15/2011  . METATARSALGIA 07/14/2010  . BUNIONS, BILATERAL 07/14/2010  . FOOT PAIN, BILATERAL 07/14/2010  . BACK PAIN,  THORACIC REGION 03/30/2010    Past Medical History:  Diagnosis Date  . Arthritis   . Chicken pox   . GERD (gastroesophageal reflux disease)   . High blood pressure   . Hypertension     Past Surgical History:  Procedure Laterality Date  . COLONOSCOPY     Dr Jeani HawkingPatrick Hung before 2010 per patient   . ESOPHAGOGASTRODUODENOSCOPY     over 20 years ago    Social History   Tobacco Use  . Smoking status: Never Smoker  . Smokeless tobacco: Never Used  Substance Use Topics  . Alcohol use: Not Currently  . Drug use: Never    Family History  Problem Relation Age of Onset  . COPD Mother   . Heart disease Mother   . Diabetes Father   . Heart disease Father   . Colon cancer Neg Hx   . Esophageal cancer Neg Hx     Allergies  Allergen Reactions  . Shellfish-Derived Products Anaphylaxis  . Statins Diarrhea and Nausea And Vomiting  . Amlodipine Swelling  . Pravastatin Diarrhea  . Ketoprofen Rash    Medication list has been reviewed and updated.  Current Outpatient Medications on File Prior to Visit  Medication Sig Dispense Refill  . benazepril (LOTENSIN) 5 MG tablet Take 1 tablet (5 mg total) by mouth daily. Increase to 10 mg if directed by  MD 60 tablet 5  . cholecalciferol (VITAMIN D) 1000 units tablet Take 2,000 Units by mouth daily.    . diclofenac sodium (VOLTAREN) 1 % GEL Apply 2 g topically 4 (four) times daily. 1 Tube 3  . hydrochlorothiazide (HYDRODIURIL) 12.5 MG tablet Take 1 tablet (12.5 mg total) by mouth daily. Use as needed for swelling of legs 30 tablet 3  . meclizine (ANTIVERT) 25 MG tablet Take 1 tablet (25 mg total) by mouth 3 (three) times daily as needed for dizziness. 30 tablet 0  . Multiple Vitamins-Minerals (CENTRUM ADULTS PO) Take by mouth.     No current facility-administered medications on file prior to visit.     Review of Systems:  As per HPI- otherwise negative.  Physical Examination: Vitals:   06/17/19 1415  BP: 132/80  Pulse: 75  Resp:  16  Temp: 97.7 F (36.5 C)  SpO2: 98%   Vitals:   06/17/19 1415  Weight: 168 lb (76.2 kg)  Height: 5\' 4"  (1.626 m)   Body mass index is 28.84 kg/m. Ideal Body Weight: Weight in (lb) to have BMI = 25: 145.3  GEN: WDWN, NAD, Non-toxic, A & O x 3 HEENT: Atraumatic, Normocephalic. Neck supple. No masses, No LAD. Ears and Nose: No external deformity. CV: RRR, No M/G/R. No JVD. No thrill. No extra heart sounds. PULM: CTA B, no wheezes, crackles, rhonchi. No retractions. No resp. distress. No accessory muscle use. ABD: S, NT, ND, +BS. No rebound. No HSM. EXTR: No c/c/e NEURO Normal gait.  PSYCH: Normally interactive. Conversant. Not depressed or anxious appearing.  Calm demeanor.    Assessment and Plan:   ICD-10-CM   1. Essential hypertension  I10 amLODipine (NORVASC) 5 MG tablet   Following up on her BP today We have made some adjustments and changes to her BP recently- she had noted some LE swelling with amlodipine so we stopped it and changed to benazepril.  However last week her BP was quite high and she was seen in the ER.  She changed herself back over to amlodipine 5 and has done well, swelling seems to have resolved with the addition of lemon water in the morning She will continue this regimen and see me in about 3 months- will contact me if any concerns in the meantime   Follow-up: No follow-ups on file.  Meds ordered this encounter  Medications  . amLODipine (NORVASC) 5 MG tablet    Sig: Take 1 tablet (5 mg total) by mouth daily.    Dispense:  90 tablet    Refill:  3   No orders of the defined types were placed in this encounter.   @SIGN @    Signed Lamar Blinks, MD  Wt Readings from Last 3 Encounters:  06/17/19 168 lb (76.2 kg)  06/11/19 168 lb (76.2 kg)  06/05/19 168 lb (76.2 kg)

## 2019-06-17 ENCOUNTER — Ambulatory Visit: Payer: Medicare Other | Admitting: Family Medicine

## 2019-06-17 ENCOUNTER — Other Ambulatory Visit: Payer: Self-pay

## 2019-06-17 ENCOUNTER — Encounter: Payer: Self-pay | Admitting: Family Medicine

## 2019-06-17 DIAGNOSIS — I10 Essential (primary) hypertension: Secondary | ICD-10-CM | POA: Diagnosis not present

## 2019-06-17 MED ORDER — AMLODIPINE BESYLATE 5 MG PO TABS: 5.0000 mg | ORAL_TABLET | Freq: Every day | ORAL | 3 refills | Status: DC

## 2019-06-17 MED FILL — AMLODIPINE BESYLATE 5 MG TA: 5 | 90 days supply | Qty: 90 | Fill #0

## 2019-06-17 NOTE — Patient Instructions (Addendum)
Your BP looks fine on the 5 mg of amlodpine  Certainly ok to continue using your lemon water Your most recent kidney labs from the ER look fine  Please continue to monitor your BP and let me know how it looks - if you have any concerns please do let me know!

## 2019-07-01 ENCOUNTER — Encounter: Payer: Self-pay | Admitting: Family Medicine

## 2019-07-02 ENCOUNTER — Encounter (HOSPITAL_COMMUNITY): Payer: Self-pay

## 2019-07-02 ENCOUNTER — Other Ambulatory Visit: Payer: Self-pay

## 2019-07-02 ENCOUNTER — Emergency Department (HOSPITAL_COMMUNITY): Payer: Medicare Other

## 2019-07-02 ENCOUNTER — Emergency Department (HOSPITAL_COMMUNITY)
Admission: EM | Admit: 2019-07-02 | Discharge: 2019-07-02 | Disposition: A | Discharge status: Home / Self Care | Payer: Medicare Other | Attending: Emergency Medicine | Admitting: Emergency Medicine

## 2019-07-02 DIAGNOSIS — R51 Headache: Secondary | ICD-10-CM | POA: Insufficient documentation

## 2019-07-02 DIAGNOSIS — R42 Dizziness and giddiness: Secondary | ICD-10-CM | POA: Diagnosis not present

## 2019-07-02 DIAGNOSIS — Z79899 Other long term (current) drug therapy: Secondary | ICD-10-CM | POA: Insufficient documentation

## 2019-07-02 DIAGNOSIS — I129 Hypertensive chronic kidney disease with stage 1 through stage 4 chronic kidney disease, or unspecified chronic kidney disease: Secondary | ICD-10-CM | POA: Insufficient documentation

## 2019-07-02 DIAGNOSIS — N183 Chronic kidney disease, stage 3 (moderate): Secondary | ICD-10-CM | POA: Insufficient documentation

## 2019-07-02 LAB — COMPREHENSIVE METABOLIC PANEL
ALT: 23 U/L (ref 0–44)
AST: 19 U/L (ref 15–41)
Albumin: 4.3 g/dL (ref 3.5–5.0)
Alkaline Phosphatase: 74 U/L (ref 38–126)
Anion gap: 9 (ref 5–15)
BUN: 27 mg/dL — ABNORMAL HIGH (ref 8–23)
CO2: 25 mmol/L (ref 22–32)
Calcium: 9.7 mg/dL (ref 8.9–10.3)
Chloride: 105 mmol/L (ref 98–111)
Creatinine, Ser: 1.01 mg/dL — ABNORMAL HIGH (ref 0.44–1.00)
GFR calc Af Amer: >60 mL/min (ref >60)
GFR calc non Af Amer: 52 mL/min — ABNORMAL LOW (ref >60)
Glucose, Bld: 114 mg/dL — ABNORMAL HIGH (ref 70–99)
Potassium: 4.1 mmol/L (ref 3.5–5.1)
Sodium: 139 mmol/L (ref 135–145)
Total Bilirubin: 0.9 mg/dL (ref 0.3–1.2)
Total Protein: 8 g/dL (ref 6.5–8.1)

## 2019-07-02 LAB — CBC WITH DIFFERENTIAL/PLATELET
Abs Immature Granulocytes: 0.02 10*3/uL (ref 0.00–0.07)
Basophils Absolute: 0 10*3/uL (ref 0.0–0.1)
Basophils Relative: 0 %
Eosinophils Absolute: 0.1 10*3/uL (ref 0.0–0.5)
Eosinophils Relative: 1 %
HCT: 42.5 % (ref 36.0–46.0)
Hemoglobin: 13.9 g/dL (ref 12.0–15.0)
Immature Granulocytes: 0 %
Lymphocytes Relative: 29 %
Lymphs Abs: 2 10*3/uL (ref 0.7–4.0)
MCH: 32.1 pg (ref 26.0–34.0)
MCHC: 32.7 g/dL (ref 30.0–36.0)
MCV: 98.2 fL (ref 80.0–100.0)
Monocytes Absolute: 0.6 10*3/uL (ref 0.1–1.0)
Monocytes Relative: 9 %
Neutro Abs: 4.2 10*3/uL (ref 1.7–7.7)
Neutrophils Relative %: 61 %
Platelets: 192 10*3/uL (ref 150–400)
RBC: 4.33 MIL/uL (ref 3.87–5.11)
RDW: 12.5 % (ref 11.5–15.5)
WBC: 7 10*3/uL (ref 4.0–10.5)
nRBC: 0 % (ref 0.0–0.2)

## 2019-07-02 LAB — URINALYSIS, ROUTINE W REFLEX MICROSCOPIC
Bilirubin Urine: NEGATIVE
Glucose, UA: NEGATIVE mg/dL
Hgb urine dipstick: NEGATIVE
Ketones, ur: NEGATIVE mg/dL
Leukocytes,Ua: NEGATIVE
Nitrite: NEGATIVE
Protein, ur: NEGATIVE mg/dL
Specific Gravity, Urine: 1.006 (ref 1.005–1.030)
pH: 6 (ref 5.0–8.0)

## 2019-07-02 MED ORDER — SODIUM CHLORIDE 0.9 % IV BOLUS
1000.0000 mL | Freq: Once | INTRAVENOUS | Status: AC
  Administered 2019-07-02: 19:00:00 1000 mL via INTRAVENOUS

## 2019-07-02 NOTE — ED Notes (Signed)
Care handoff received from Rebecca Goodwin, RN 

## 2019-07-02 NOTE — ED Triage Notes (Addendum)
Patient sent over from PCP to rule out stroke.  Patient has had blurred vision, unstable coordination and balance.   Patient states she also had a new medication change.  A/ox4 Ambulatory in triage.

## 2019-07-02 NOTE — Discharge Instructions (Addendum)
Please call your doctor tomorrow to schedule an appointment for follow-up.  Please return to the emergency department immediately for any new or worsening symptoms including severe headaches, changes in vision, dizziness, numbness/weakness or difficulty with balance.  Also return for any chest pain or shortness of breath.

## 2019-07-02 NOTE — Telephone Encounter (Signed)
Pt prefers to be seen at [facility].  She is aware that [facility] may be preferred for neuro issues, but [name] is much easier for her to navigate at her age.  I think this is ok

## 2019-07-02 NOTE — Telephone Encounter (Signed)
I called her back on receipt of her message. She is not feeling right, notes that she had a hard time focusing on her card game this am and her vision seems to be abnormal Her BP is running quite high No CP or SOB  She feels like she was doing fine until we "added benazepril"- however I reminded her that she has actually been on benazepril for a long time, it was just in a combination pill until recently.  I don't think that her BP necessarily explains her symptoms and there may be concern for stroke or other pathology She was seen in the ER earlier this month with a similar concern, no MRI done however as she was at the [facility] and they don't have MRI  I advised her to be seen at the ER for evaluation - cannot use the med center as she may need an MRI of her brain She plans to go to [facility] hospital for eval- will go right away Her sx do not seem debilitating and have been present since this am so do not think she will be candidate for TPA, do not need EMS

## 2019-07-02 NOTE — ED Provider Notes (Signed)
[facility]-EMERGENCY DEPT Provider Note   CSN: [id number] Arrival date & time: 07/02/19  1722     History   Chief Complaint Chief Complaint  Patient presents with  . R/O stroke  . Dizziness  . Blurred Vision  . Headache    HPI Katie Johnson is a 83 y.o. female.     HPI   Pt is an 83 y/o female with a h/o arthritis, GERD, HTN, HLD, who presents to the ED today for eval blurred vision, dizziness/lightheadedness, and gait instability. States she last felt normal yesterday.   States that the dizziness has been intermittent for the past month and started after titration of her BP meds. Sxs seem worse when she looks down, changes positions, and when she stands up. The dizziness improves after she sits down. Sxs were more severe this morning and she felt unstable while walking in her home and while taking a shower. States she needed to hold onto things to stay steady. This feeling has improved as the day has gone on. She denies vertiginous sxs.   Reports associated blurred vision today that seems to coincide with episodes of dizziness. It has also improved throughout the day today. She also noted that she was having trouble focusing on a card game she was playing on the computer and that she feels somewhat confused. She has a mild headache as well. She denies speech difficulty, numbness or weakness. Denies chest pain or shortness of breath. Denies any recent URI sxs. Denies recent falls or trauma. No blood thinner use.   She feels that her sxs are related to her BP and/or her BP medication.  Initially, her BP this AM was 157 systolic. She then took her benazepril and states that her BP increased to 180/90.    Past Medical History:  Diagnosis Date  . Arthritis   . Chicken pox   . GERD (gastroesophageal reflux disease)   . High blood pressure   . Hypertension     Patient Active Problem List   Diagnosis Date Noted  . Acute respiratory failure with hypoxia (HCC)  10/03/2018  . CAP (community acquired pneumonia) 10/03/2018  . Hyponatremia 10/03/2018  . CKD (chronic kidney disease) stage 3, GFR 30-59 ml/min (HCC) 10/03/2018  . Generalized weakness 10/03/2018  . Hypophosphatemia 10/03/2018  . Abnormal LFTs 10/03/2018  . Macrocytic anemia 10/03/2018  . Sepsis (HCC) 09/30/2018  . AC (acromioclavicular) arthritis 12/21/2017  . Essential hypertension 11/28/2017  . Degenerative arthritis of knee, bilateral 07/15/2015  . De Quervain's disease (tenosynovitis) 09/15/2011  . METATARSALGIA 07/14/2010  . BUNIONS, BILATERAL 07/14/2010  . FOOT PAIN, BILATERAL 07/14/2010  . BACK PAIN, THORACIC REGION 03/30/2010    Past Surgical History:  Procedure Laterality Date  . COLONOSCOPY     Dr Jeani HawkingPatrick Hung before 2010 per patient   . ESOPHAGOGASTRODUODENOSCOPY     over 20 years ago     OB History   No obstetric history on file.      Home Medications    Prior to Admission medications   Medication Sig Start Date End Date Taking? Authorizing Provider  amLODipine (NORVASC) 5 MG tablet Take 1 tablet (5 mg total) by mouth daily. 06/17/19  Yes Copland, Gwenlyn FoundJessica C, MD  benazepril (LOTENSIN) 5 MG tablet Take 2.5 mg by mouth daily.   Yes [provider]  cholecalciferol (VITAMIN D) 1000 units tablet Take 1,000 Units by mouth daily.    Yes [provider]  diclofenac sodium (VOLTAREN) 1 % GEL  Apply 2 g topically 4 (four) times daily. 04/08/19  Yes Judi SaaSmith, Zachary M, DO  hydrochlorothiazide (HYDRODIURIL) 12.5 MG tablet Take 1 tablet (12.5 mg total) by mouth daily. Use as needed for swelling of legs Patient taking differently: Take 12.5 mg by mouth daily as needed (swelling). Use as needed for swelling of legs 02/26/19  Yes Copland, Gwenlyn FoundJessica C, MD  meclizine (ANTIVERT) 25 MG tablet Take 1 tablet (25 mg total) by mouth 3 (three) times daily as needed for dizziness. 06/11/19  Yes Melene PlanFloyd, Dan, DO  Multiple Vitamins-Minerals (CENTRUM ADULTS PO) Take 1 tablet by  mouth daily.    Yes [provider]    Family History Family History  Problem Relation Age of Onset  . COPD Mother   . Heart disease Mother   . Diabetes Father   . Heart disease Father   . Colon cancer Neg Hx   . Esophageal cancer Neg Hx     Social History Social History   Tobacco Use  . Smoking status: Never Smoker  . Smokeless tobacco: Never Used  Substance Use Topics  . Alcohol use: Not Currently  . Drug use: Never     Allergies   Shellfish-derived products, Statins, Amlodipine, Pravastatin, and Ketoprofen   Review of Systems Review of Systems  Constitutional: Negative for chills and fever.  HENT: Negative for ear pain and sore throat.   Eyes: Negative for visual disturbance.  Respiratory: Negative for cough and shortness of breath.   Cardiovascular: Negative for chest pain.  Gastrointestinal: Negative for abdominal pain, constipation, diarrhea, nausea and vomiting.  Genitourinary: Negative for dysuria.  Musculoskeletal: Negative for back pain.  Skin: Negative for rash.  Neurological: Positive for dizziness, light-headedness and headaches. Negative for weakness and numbness.  All other systems reviewed and are negative.    Physical Exam Updated Vital Signs BP (!) 176/84   Pulse 74   Temp 98.3 F (36.8 C) (Oral)   Resp 20   SpO2 96%   Physical Exam Vitals signs and nursing note reviewed.  Constitutional:      General: She is not in acute distress.    Appearance: She is well-developed. She is not ill-appearing or toxic-appearing.  HENT:     Head: Normocephalic and atraumatic.     Mouth/Throat:     Mouth: Mucous membranes are moist.  Eyes:     Extraocular Movements: Extraocular movements intact.     Conjunctiva/sclera: Conjunctivae normal.     Pupils: Pupils are equal, round, and reactive to light.  Neck:     Musculoskeletal: Neck supple.  Cardiovascular:     Rate and Rhythm: Normal rate and regular rhythm.     Heart sounds: No murmur.   Pulmonary:     Effort: Pulmonary effort is normal. No respiratory distress.     Breath sounds: Normal breath sounds.  Abdominal:     General: Bowel sounds are normal. There is no distension.     Palpations: Abdomen is soft.     Tenderness: There is no abdominal tenderness.  Skin:    General: Skin is warm and dry.  Neurological:     Mental Status: She is alert.     Comments: Mental Status:  Alert, thought content appropriate, able to give a coherent history. Speech fluent without evidence of aphasia. Able to follow 2 step commands without difficulty.  Cranial Nerves:  II:  pupils equal, round, reactive to light III,IV, VI: ptosis not present, extra-ocular motions intact bilaterally  V,VII: smile symmetric, facial light touch  sensation equal VIII: hearing grossly normal to voice  X: uvula elevates symmetrically  XI: bilateral shoulder shrug symmetric and strong XII: midline tongue extension without fassiculations Motor:  Normal tone. 5/5 strength of BUE and BLE major muscle groups including strong and equal grip strength and dorsiflexion/plantar flexion Sensory: light touch normal in all extremities. Cerebellar: normal finger-to-nose with bilateral upper extremities, normal heel to shin Gait: normal gait and balance. Negative romberg      ED Treatments / Results  Labs (all labs ordered are listed, but only abnormal results are displayed) Labs Reviewed  COMPREHENSIVE METABOLIC PANEL - Abnormal; Notable for the following components:      Result Value   Glucose, Bld 114 (*)    BUN 27 (*)    Creatinine, Ser 1.01 (*)    GFR calc non Af Amer 52 (*)    All other components within normal limits  URINALYSIS, ROUTINE W REFLEX MICROSCOPIC - Abnormal; Notable for the following components:   Color, Urine STRAW (*)    All other components within normal limits  CBC WITH DIFFERENTIAL/PLATELET    EKG EKG Interpretation  Date/Time:  Tuesday July 02 2019 17:31:59 EDT Ventricular  Rate:  67 PR Interval:    QRS Duration: 96 QT Interval:  393 QTC Calculation: 415 R Axis:   63 Text Interpretation:  Sinus rhythm Probable left atrial enlargement No acute changes No significant change since last tracing Confirmed by Derwood Kaplan ([id number]) on 07/02/2019 6:44:01 PM   Radiology Mr Brain Wo Contrast  Result Date: 07/02/2019 CLINICAL DATA:  Acute presentation with blurred vision and balance disturbance. EXAM: MRI HEAD WITHOUT CONTRAST TECHNIQUE: Multiplanar, multiecho pulse sequences of the brain and surrounding structures were obtained without intravenous contrast. COMPARISON:  None. FINDINGS: Brain: Diffusion imaging does not show any acute or subacute infarction. The brainstem and cerebellum are normal. 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 No cortical or large vessel territory infarction. No mass lesion, hemorrhage, hydrocephalus or extra-axial collection. Vascular: Major vessels at the base of the brain show flow. Skull and upper cervical spine: Negative Sinuses/Orbits: Clear/normal Other: None IMPRESSION: No acute finding to explain the clinical presentation. Age related atrophy. Moderate chronic small-vessel ischemic changes of the cerebral hemispheric white matter. Electronically Signed   By: Paulina Fusi M.D.   On: 07/02/2019 20:08    Procedures Procedures (including critical care time)  Medications Ordered in ED Medications  sodium chloride 0.9 % bolus 1,000 mL (0 mLs Intravenous Stopped 07/02/19 2051)     Initial Impression / Assessment and Plan / ED Course  I have reviewed the triage vital signs and the nursing notes.  Pertinent labs & imaging results that were available during my care of the patient were reviewed by me and considered in my medical decision making (see chart for details).     Final Clinical Impressions(s) / ED  Diagnoses   Final diagnoses:  Dizziness   83 y/o female presenting for eval of dizziness/lightheadedness and blurred vision.   Initially somewhat hypertensive, but otherwise her VS are reassuring. Her neurologic exam is benign and she is ambulatory in the ED.   Will order labs, EKG, orthostatics, and CT head.   CBC WNL CMP with elevated BUN at 27 and slightly elevated Cr at 1.01 UA neg for UTI  EKG with NSR, LAE, no acute changes from prior tracing   MRI brain with no  acute finding to explain the clinical presentation. Age related atrophy. Moderate chronic small-vessel ischemic changes of the cerebral hemispheric white matter.  Patient's work-up is reassuring.  Her symptoms have been improving throughout the day.  She has been ambulatory in the ED.  I feel she is appropriate for outpatient work-up at this time.  I do question whether her symptoms are related to adverse effect of her medication versus secondary to her hypertension.  I advised that she will need to follow-up with her PCP in regards to her BP medications.  If her dizziness were to persist I would recommend that she follow-up with neurology.  She and her husband at bedside voiced understanding of the plan.  I discussed reasons to return and they voiced understanding.  All questions answered.  Patient stable for discharge.  Case was discussed with Dr. Kathrynn Humble who is in agreement with this plan.  ED Discharge Orders    None       Bishop Dublin 07/02/19 2125    Varney Biles, MD 07/09/19 1321

## 2019-07-03 ENCOUNTER — Encounter: Payer: Self-pay | Admitting: Family Medicine

## 2019-07-06 NOTE — Progress Notes (Signed)
[place] at [facility] [address], [place], [place] [address] [phone]  Date:  07/08/2019   Name:  Katie Johnson   DOB:  14-Sep-1936   MRN:  [id number]  PCP:  Darreld Mclean, MD    Chief Complaint: Hypertension (possible stroke symptoms)   History of Present Illness:  Katie Johnson is a 83 y.o. very pleasant female patient who presents with the following:  Katie Johnson is here today for follow-up on blood pressure and possible TIA symptoms We have recently been struggling to control more volatile blood pressure In the office on August 10-at that time she had recently been seen in the in the ER on August 4  She contacted me via [place] on 8/25 with concern of elevated blood pressure and also difficulty focusing, possible visual changes.  I was concerned she might be having a stroke or TIA She was seen in the emergency department on August 25 on my instruction She had an MRI of her brain which was normal We have also recently monitoring her renal function, this was reasonable at most recent labs  Amlodipine 5 mg- 10 caused swelling  hctz 12.5 mg - she is NOT taking She stopped taking benazepril as she thinks it may be causing her BP to be too high or causing other side effects  We checked her home machine against ours- it seems that she is running about 10 mmHg high at home Her BP has been running "all over the place" She brings in a list of BP readings- over the last 5 days she is taking just 5 mg of amlodipine She stopped taking benazepril - she thinks this may be causing her BP to go quite high  She has been on this med since at least 2012, but it was in a combo med (lotrel) until recently.     BP readings are generally 160- 170/ 80s over the last week  Katie Johnson has been reading about high blood pressure online, is quite concerned that these numbers are dangerous.  I explained to her that while we do want her blood pressure lower, these  blood pressure readings are not generally dangerous in the short-term.  I explained to her that I had her go to the ER not because of her blood pressure, but because of her other symptoms including vision change, dizziness, possible mental status change.  Thankfully her MRI did not show an acute stroke  Mr Brain Wo Contrast  Result Date: 07/02/2019 CLINICAL DATA:  Acute presentation with blurred vision and balance disturbance. EXAM: MRI HEAD WITHOUT CONTRAST TECHNIQUE: Multiplanar, multiecho pulse sequences of the brain and surrounding structures were obtained without intravenous contrast. COMPARISON:  None. FINDINGS: Brain: Diffusion imaging does not show any acute or subacute infarction. The brainstem and cerebellum are normal. 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 No cortical or large vessel territory infarction. No mass lesion, hemorrhage, hydrocephalus or extra-axial collection. Vascular: Major vessels at the base of the brain show flow. Skull and upper cervical spine: Negative Sinuses/Orbits: Clear/normal Other: None IMPRESSION: No acute finding to explain the clinical presentation. Age related atrophy. Moderate chronic small-vessel ischemic changes of the cerebral hemispheric white matter. Electronically Signed   By: Nelson Chimes M.D.   On: 07/02/2019 20:08     Patient Active Problem List   Diagnosis Date  Noted  . Acute respiratory failure with hypoxia (HCC) 10/03/2018  . CAP (community acquired pneumonia) 10/03/2018  . Hyponatremia 10/03/2018  . CKD (chronic kidney disease) stage 3, GFR 30-59 ml/min (HCC) 10/03/2018  . Generalized weakness 10/03/2018  . Hypophosphatemia 10/03/2018  . Abnormal LFTs 10/03/2018  . Macrocytic anemia 10/03/2018  . Sepsis (HCC) 09/30/2018  . AC (acromioclavicular) arthritis 12/21/2017  . Essential hypertension 11/28/2017  .  Degenerative arthritis of knee, bilateral 07/15/2015  . De Quervain's disease (tenosynovitis) 09/15/2011  . METATARSALGIA 07/14/2010  . BUNIONS, BILATERAL 07/14/2010  . FOOT PAIN, BILATERAL 07/14/2010  . BACK PAIN, THORACIC REGION 03/30/2010    Past Medical History:  Diagnosis Date  . Arthritis   . Chicken pox   . GERD (gastroesophageal reflux disease)   . High blood pressure   . Hypertension     Past Surgical History:  Procedure Laterality Date  . COLONOSCOPY     Dr Jeani Hawking before 2010 per patient   . ESOPHAGOGASTRODUODENOSCOPY     over 20 years ago    Social History   Tobacco Use  . Smoking status: Never Smoker  . Smokeless tobacco: Never Used  Substance Use Topics  . Alcohol use: Not Currently  . Drug use: Never    Family History  Problem Relation Age of Onset  . COPD Mother   . Heart disease Mother   . Diabetes Father   . Heart disease Father   . Colon cancer Neg Hx   . Esophageal cancer Neg Hx     Allergies  Allergen Reactions  . Shellfish-Derived Products Anaphylaxis  . Statins Diarrhea and Nausea And Vomiting  . Amlodipine Swelling    Foot swelling only   . Benazepril     High Blood Pressure 209/93  . Pravastatin Diarrhea  . Ketoprofen Rash    Medication list has been reviewed and updated.  Current Outpatient Medications on File Prior to Visit  Medication Sig Dispense Refill  . amLODipine (NORVASC) 5 MG tablet Take 1 tablet (5 mg total) by mouth daily. 90 tablet 3  . cholecalciferol (VITAMIN D) 1000 units tablet Take 1,000 Units by mouth daily.     . diclofenac sodium (VOLTAREN) 1 % GEL Apply 2 g topically 4 (four) times daily. 1 Tube 3  . hydrochlorothiazide (HYDRODIURIL) 12.5 MG tablet Take 1 tablet (12.5 mg total) by mouth daily. Use as needed for swelling of legs (Patient taking differently: Take 12.5 mg by mouth daily as needed (swelling). Use as needed for swelling of legs) 30 tablet 3  . meclizine (ANTIVERT) 25 MG tablet Take 1  tablet (25 mg total) by mouth 3 (three) times daily as needed for dizziness. 30 tablet 0  . Multiple Vitamins-Minerals (CENTRUM ADULTS PO) Take 1 tablet by mouth daily.     . benazepril (LOTENSIN) 5 MG tablet Take 2.5 mg by mouth daily.     No current facility-administered medications on file prior to visit.     Review of Systems:  As per HPI- otherwise negative. No fever or chills Currently feels fine, no dizziness  Physical Examination: Vitals:   07/08/19 1521  BP: (!) 160/80  Pulse: 70  Resp: 16  Temp: 97.8 F (36.6 C)  SpO2: 98%   Vitals:   07/08/19 1521  Weight: 168 lb (76.2 kg)  Height: 5\' 4"  (1.626 m)   Body mass index is 28.84 kg/m. Ideal Body Weight: Weight in (lb) to have BMI = 25: 145.3  GEN: WDWN, NAD, Non-toxic, A & O  x 3, mild overweight, looks well HEENT: Atraumatic, Normocephalic. Neck supple. No masses, No LAD. Ears and Nose: No external deformity. CV: RRR, No M/G/R. No JVD. No thrill. No extra heart sounds. PULM: CTA B, no wheezes, crackles, rhonchi. No retractions. No resp. distress. No accessory muscle use. EXTR: No c/c/e NEURO Normal gait.  PSYCH: Normally interactive. Conversant. Not depressed or anxious appearing.  Calm demeanor.   BP Readings from Last 3 Encounters:  07/08/19 (!) 160/80  07/02/19 (!) 147/72  06/17/19 132/80    Assessment and Plan:   ICD-10-CM   1. Essential hypertension  I10 losartan (COZAAR) 25 MG tablet  2. Needs flu shot  Z23 Flu Vaccine QUAD High Dose(Fluad)   Flu shot given today Following up from repeat ER visit for high blood pressure.  Katie Johnson feels more relaxed about her blood pressure management, understands it may take us a while to find the right balance.  We will have her continue amlodipine, and add losartan 25 mg.  She plans to get a new blood pressure monitor, and will update me regarding her blood pressure over the next few days.  We will then further adjust her medications as needed  Recent renal function  was reasonable for age, explained that mild kidney disease is quite common as we get older.  This will need to be monitored, but is not cause for acute alarm  Note that she is not actually allergic to ACE inhibitor benazepril, but recently has seemed intolerant-she feels like it makes her blood pressure higher  Follow-up: No follow-ups on file.  Meds ordered this encounter  Medications  . losartan (COZAAR) 25 MG tablet    Sig: Take 1 tablet (25 mg total) by mouth daily.    Dispense:  30 tablet    Refill:  6   Orders Placed This Encounter  Procedures  . Flu Vaccine QUAD High Dose(Fluad)    @SIGN @    Signed Abbe AmsterdamJessica Hezikiah Retzloff, MD

## 2019-07-08 ENCOUNTER — Encounter: Payer: Self-pay | Admitting: Family Medicine

## 2019-07-08 ENCOUNTER — Ambulatory Visit (INDEPENDENT_AMBULATORY_CARE_PROVIDER_SITE_OTHER): Payer: Medicare Other | Admitting: Family Medicine

## 2019-07-08 ENCOUNTER — Other Ambulatory Visit: Payer: Self-pay

## 2019-07-08 VITALS — BP 160/86 | HR 70 | Temp 97.8°F | Resp 16 | Ht 64.0 in | Wt 168.0 lb

## 2019-07-08 DIAGNOSIS — Z23 Encounter for immunization: Secondary | ICD-10-CM

## 2019-07-08 DIAGNOSIS — I10 Essential (primary) hypertension: Secondary | ICD-10-CM | POA: Diagnosis not present

## 2019-07-08 MED ORDER — LOSARTAN POTASSIUM 25 MG PO TABS: 25.0000 mg | ORAL_TABLET | Freq: Every day | ORAL | 6 refills | Status: DC

## 2019-07-08 MED FILL — LOSARTAN POTASSIUM 25 MG TA: 25 | 30 days supply | Qty: 30 | Fill #0

## 2019-07-08 NOTE — Patient Instructions (Addendum)
It was good to see you today I am sorry that you had to go to the ER!  Let's continue the amlodipine 5 mg and add losartan 25 once a day   Our blood pressure goals for you will be: Top number 120 -145 Bottom number 70- 90  Please let me know how you respond to this medication change - please send me some BP readings over mychart

## 2019-07-17 ENCOUNTER — Encounter: Payer: Self-pay | Admitting: Family Medicine

## 2019-07-21 NOTE — Progress Notes (Signed)
Tawana ScaleZach Jelitza Manninen D.O. [facility] [address] [place], [place] [address] Phone: [phone] Subjective:       CC: Left hip pain, bilateral knee pain  BJY:[id number]:Subjective   04/08/2019 Patient feels with viscosupplementation nothing to make a significant improvement.  Patient does not feel like she will wear custom bracing on a regular basis.  Patient is considering surgical intervention.  Patient will be referred to orthopedic surgery to discuss.  We discussed if she decides not to do surgery we can continue with some of other possibilities including PRP but this would be more experimental.  Patient will follow-up with me again as needed.  Spent  25 minutes with patient face-to-face and had greater than 50% of counseling including as described above in assessment and plan.  Update 07/23/2019 Zola Buttonnn B Plemons is a 83 y.o. female coming in with complaint of bilateral knee pain. Pain has improved since last visit as she was thinking that visco did not work. Pain with deep knee bending especially with sit to stand.  Also having left hip pain over the GT.     X-rays from June 05, 2019 shows moderate degenerative changes of the left hip  Past Medical History:  Diagnosis Date  . Arthritis   . Chicken pox   . GERD (gastroesophageal reflux disease)   . High blood pressure   . Hypertension    Past Surgical History:  Procedure Laterality Date  . COLONOSCOPY     Dr Jeani HawkingPatrick Hung before 2010 per patient   . ESOPHAGOGASTRODUODENOSCOPY     over 20 years ago   Social History   Socioeconomic History  . Marital status: Married    Spouse name: Not on file  . Number of children: Not on file  . Years of education: Not on file  . Highest education level: Not on file  Occupational History  . Occupation: Agricultural consultantvolunteer at [employer]  Social Needs  . Financial resource strain: Not on file  . Food insecurity    Worry: Not on file    Inability: Not on file  . Transportation needs    Medical: Not on file   Non-medical: Not on file  Tobacco Use  . Smoking status: Never Smoker  . Smokeless tobacco: Never Used  Substance and Sexual Activity  . Alcohol use: Not Currently  . Drug use: Never  . Sexual activity: Not on file  Lifestyle  . Physical activity    Days per week: Not on file    Minutes per session: Not on file  . Stress: Not on file  Relationships  . Social Musicianconnections    Talks on phone: Not on file    Gets together: Not on file    Attends religious service: Not on file    Active member of club or organization: Not on file    Attends meetings of clubs or organizations: Not on file    Relationship status: Not on file  Other Topics Concern  . Not on file  Social History Narrative  . Not on file   Allergies  Allergen Reactions  . Shellfish-Derived Products Anaphylaxis  . Statins Diarrhea and Nausea And Vomiting  . Amlodipine Swelling    Foot swelling only   . Benazepril     High Blood Pressure 209/93  . Pravastatin Diarrhea  . Ketoprofen Rash   Family History  Problem Relation Age of Onset  . COPD Mother   . Heart disease Mother   . Diabetes Father   . Heart  disease Father   . Colon cancer Neg Hx   . Esophageal cancer Neg Hx      Current Outpatient Medications (Cardiovascular):  .  amLODipine (NORVASC) 5 MG tablet, Take 1 tablet (5 mg total) by mouth daily. Marland Kitchen  losartan (COZAAR) 25 MG tablet, Take 1 tablet (25 mg total) by mouth daily.     Current Outpatient Medications (Other):  .  cholecalciferol (VITAMIN D) 1000 units tablet, Take 1,000 Units by mouth daily.  .  diclofenac sodium (VOLTAREN) 1 % GEL, Apply 2 g topically 4 (four) times daily. .  meclizine (ANTIVERT) 25 MG tablet, Take 1 tablet (25 mg total) by mouth 3 (three) times daily as needed for dizziness. .  Multiple Vitamins-Minerals (CENTRUM ADULTS PO), Take 1 tablet by mouth daily.     Past medical history, social, surgical and family history all reviewed in electronic medical record.  No  pertanent information unless stated regarding to the chief complaint.   Review of Systems:  No headache, visual changes, nausea, vomiting, diarrhea, constipation, dizziness, abdominal pain, skin rash, fevers, chills, night sweats, weight loss, swollen lymph nodes, body aches, joint swelling, chest pain, shortness of breath, mood changes.  Positive muscle aches  Objective  Blood pressure 120/66, pulse 72, height 5\' 4"  (1.626 m), weight 167 lb (75.8 kg), SpO2 98 %.    General: No apparent distress alert and oriented x3 mood and affect normal, dressed appropriately.  HEENT: Pupils equal, extraocular movements intact  Respiratory: Patient's speak in full sentences and does not appear short of breath  Cardiovascular: No lower extremity edema, non tender, no erythema  Skin: Warm dry intact with no signs of infection or rash on extremities or on axial skeleton.  Abdomen: Soft nontender  Neuro: Cranial nerves II through XII are intact, neurovascularly intact in all extremities with 2+ DTRs and 2+ pulses.  Lymph: No lymphadenopathy of posterior or anterior cervical chain or axillae bilaterally.  Gait antalgic MSK:  tender with full range of motion and good stability and symmetric strength and tone of shoulders, elbows, wrist, and ankles bilaterally.  Mild arthritic changes of multiple joints Knee: Bilateral valgus deformity noted. Large thigh to calf ratio.  Tender to palpation over medial and PF joint line.  ROM full in flexion and extension and lower leg rotation. instability with valgus force.  painful patellar compression. Patellar glide with moderate crepitus. Patellar and quadriceps tendons unremarkable. Hamstring and quadriceps strength is normal.   Left hip exam does have decreased range of motion in all planes.  Severe tenderness noted noted over the greater trochanteric area.  Positive Faber test.  No pain significantly in the groin with internal range of motion.  After informed  written and verbal consent, patient was seated on exam table. Right knee was prepped with alcohol swab and utilizing anterolateral approach, patient's right knee space was injected with 4:1  marcaine 0.5%: Kenalog 40mg /dL. Patient tolerated the procedure well without immediate complications.  After informed written and verbal consent, patient was seated on exam table. Left knee was prepped with alcohol swab and utilizing anterolateral approach, patient's left knee space was injected with 4:1  marcaine 0.5%: Kenalog 40mg /dL. Patient tolerated the procedure well without immediate complications.  After verbal consent patient was prepped with alcohol swabs and with a 25-gauge 2 inch needle injected with a total 1 cc of 0.5% Marcaine into the greater trochanteric area and 1 cc of Kenalog 40 mg/mL.  No blood loss.  Postinjection instructions given.  And advised  Impression and Recommendations:     This case required medical decision making of moderate complexity. The above documentation has been reviewed and is accurate and complete Lyndal Pulley, DO       Note: This dictation was prepared with Dragon dictation along with smaller phrase technology. Any transcriptional errors that result from this process are unintentional.

## 2019-07-23 ENCOUNTER — Ambulatory Visit (INDEPENDENT_AMBULATORY_CARE_PROVIDER_SITE_OTHER): Payer: Medicare Other | Admitting: Family Medicine

## 2019-07-23 ENCOUNTER — Encounter: Payer: Self-pay | Admitting: Family Medicine

## 2019-07-23 ENCOUNTER — Other Ambulatory Visit: Payer: Self-pay

## 2019-07-23 ENCOUNTER — Ambulatory Visit: Payer: Self-pay

## 2019-07-23 VITALS — BP 120/66 | HR 72 | Ht 64.0 in | Wt 167.0 lb

## 2019-07-23 DIAGNOSIS — M7062 Trochanteric bursitis, left hip: Secondary | ICD-10-CM | POA: Diagnosis not present

## 2019-07-23 DIAGNOSIS — M25552 Pain in left hip: Secondary | ICD-10-CM

## 2019-07-23 DIAGNOSIS — M170 Bilateral primary osteoarthritis of knee: Secondary | ICD-10-CM | POA: Diagnosis not present

## 2019-07-23 NOTE — Assessment & Plan Note (Signed)
Repeat injections given today.  Tolerated procedure well.  Discussed icing regimen and home exercise, which activities to do which wants to avoid.  Patient will increase activity slowly over the course the neck several days.  Follow-up again in 4 to 8 weeks.

## 2019-07-23 NOTE — Assessment & Plan Note (Signed)
Patient given injection and tolerated the procedure well.  Discussed icing regimen of exercise, which activities to do which was to avoid.  Patient will increase activity slowly over the course the next several weeks.  Follow-up again in 12 weeks.  Underlying hip arthritis and monitor.

## 2019-07-23 NOTE — Patient Instructions (Signed)
See me again in 3 months.

## 2019-07-31 ENCOUNTER — Encounter: Payer: Self-pay | Admitting: Family Medicine

## 2019-08-01 MED FILL — LOSARTAN POTASSIUM 25 MG TA: 25 | 30 days supply | Qty: 30 | Fill #1

## 2019-08-16 ENCOUNTER — Other Ambulatory Visit: Payer: Self-pay

## 2019-08-16 DIAGNOSIS — I10 Essential (primary) hypertension: Secondary | ICD-10-CM

## 2019-08-16 MED ORDER — LOSARTAN POTASSIUM 25 MG PO TABS: 37.5000 mg | ORAL_TABLET | Freq: Every day | ORAL | 5 refills | Status: DC

## 2019-08-16 MED FILL — LOSARTAN POTASSIUM 25 MG TA: 25 | 30 days supply | Qty: 45 | Fill #0

## 2019-09-09 ENCOUNTER — Other Ambulatory Visit: Payer: Self-pay | Admitting: Family Medicine

## 2019-09-09 ENCOUNTER — Encounter: Payer: Self-pay | Admitting: Family Medicine

## 2019-09-09 DIAGNOSIS — I10 Essential (primary) hypertension: Secondary | ICD-10-CM

## 2019-09-09 NOTE — Telephone Encounter (Signed)
Copied from [place] [id number]. Topic: Quick Communication - Rx Refill/Question >> Sep 09, 2019  4:12 PM Izola Price, [place] A wrote: Medication: losartan (COZAAR) 25 MG tablet ,amLODipine (NORVASC) 5 MG tablet (Patient requesting 90 day supply)  Has the patient contacted their pharmacy? {Yes (Agent: If no, request that the patient contact the pharmacy for the refill.) (Agent: If yes, when and what did the pharmacy advise?)Contact PCP  Preferred Pharmacy (with phone number or street name): [place], [place] - [address] [phone] (Phone) [phone] (Fax)    Agent: Please be advised that RX refills may take up to 3 business days. We ask that you follow-up with your pharmacy.

## 2019-09-10 MED FILL — AMLODIPINE BESYLATE 5 MG TA: 5 | 90 days supply | Qty: 90 | Fill #1

## 2019-09-10 NOTE — Telephone Encounter (Signed)
Amlodipine 5 mg Losartan 25mg  [facility] [phone] supply requested)

## 2019-09-10 NOTE — Telephone Encounter (Signed)
Please call [phone] when meds have been sent into pharmacy. Per patient request.

## 2019-09-11 MED ORDER — LOSARTAN POTASSIUM 25 MG PO TABS: 25.0000 mg | ORAL_TABLET | Freq: Every day | ORAL | 1 refills | Status: DC

## 2019-09-11 MED ORDER — AMLODIPINE BESYLATE 5 MG PO TABS: 5.0000 mg | ORAL_TABLET | Freq: Every day | ORAL | 1 refills | Status: DC

## 2019-09-11 MED FILL — LOSARTAN POTASSIUM 25 MG TA: 25 | 90 days supply | Qty: 90 | Fill #0

## 2019-09-11 NOTE — Telephone Encounter (Signed)
Medications refilled, patient notified.

## 2019-10-18 ENCOUNTER — Encounter: Payer: Self-pay | Admitting: *Deleted

## 2019-10-21 NOTE — Progress Notes (Signed)
[name] [place], [place] [address] Phone: [phone] Subjective:   I [name] am serving as a Education administrator for Dr. Hulan Saas.  This visit occurred during the SARS-CoV-2 public health emergency.  Safety protocols were in place, including screening questions prior to the visit, additional usage of staff PPE, and extensive cleaning of exam room while observing appropriate contact time as indicated for disinfecting solutions.      CC: Bilateral knee pain follow-up, hip pain  URK:[id number]   07/23/2019 Patient given injection and tolerated the procedure well.  Discussed icing regimen of exercise, which activities to do which was to avoid.  Patient will increase activity slowly over the course the next several weeks.  Follow-up again in 12 weeks.  Underlying hip arthritis and monitor.  Repeat injections given today.  Tolerated procedure well.  Discussed icing regimen and home exercise, which activities to do which wants to avoid.  Patient will increase activity slowly over the course the neck several days.  Follow-up again in 4 to 8 weeks.  10/22/2019 Katie Johnson is a 83 y.o. female coming in with complaint of left hip and bilateral knee pain. Patient would like more voltaren gel. Hip is doing well. Not as painful as the knees. Knees are not as bad. Has exercising and that has helped her knee pain.  Patient has known arthritic changes of the knees bilaterally.  Last injection was over 6 months ago.    Past Medical History:  Diagnosis Date  . Arthritis   . Chicken pox   . GERD (gastroesophageal reflux disease)   . High blood pressure   . Hypertension    Past Surgical History:  Procedure Laterality Date  . COLONOSCOPY     Dr Carol Ada before 2010 per patient   . ESOPHAGOGASTRODUODENOSCOPY     over 20 years ago   Social History   Socioeconomic History  . Marital status: Married    Spouse name: Not on file  . Number of children:  Not on file  . Years of education: Not on file  . Highest education level: Not on file  Occupational History  . Occupation: Psychologist, occupational at [employer]  . Smoking status: Never Smoker  . Smokeless tobacco: Never Used  Substance and Sexual Activity  . Alcohol use: Not Currently  . Drug use: Never  . Sexual activity: Not on file  Other Topics Concern  . Not on file  Social History Narrative  . Not on file   Social Determinants of Health   Financial Resource Strain:   . Difficulty of Paying Living Expenses: Not on file  Food Insecurity:   . Worried About Charity fundraiser in the Last Year: Not on file  . Ran Out of Food in the Last Year: Not on file  Transportation Needs:   . Lack of Transportation (Medical): Not on file  . Lack of Transportation (Non-Medical): Not on file  Physical Activity:   . Days of Exercise per Week: Not on file  . Minutes of Exercise per Session: Not on file  Stress:   . Feeling of Stress : Not on file  Social Connections:   . Frequency of Communication with Friends and Family: Not on file  . Frequency of Social Gatherings with Friends and Family: Not on file  . Attends Religious Services: Not on file  . Active Member of Clubs or Organizations: Not on file  . Attends Archivist  Meetings: Not on file  . Marital Status: Not on file   Allergies  Allergen Reactions  . Shellfish-Derived Products Anaphylaxis  . Statins Diarrhea and Nausea And Vomiting  . Amlodipine Swelling    Foot swelling only   . Benazepril     High Blood Pressure 209/93  . Pravastatin Diarrhea  . Ketoprofen Rash   Family History  Problem Relation Age of Onset  . COPD Mother   . Heart disease Mother   . Diabetes Father   . Heart disease Father   . Colon cancer Neg Hx   . Esophageal cancer Neg Hx      Current Outpatient Medications (Cardiovascular):  .  amLODipine (NORVASC) 5 MG tablet, Take 1 tablet (5 mg total) by mouth daily. Marland Kitchen.  losartan (COZAAR) 25 MG  tablet, Take 1.5 tablets (37.5 mg total) by mouth daily. Marland Kitchen.  losartan (COZAAR) 25 MG tablet, Take 1 tablet (25 mg total) by mouth daily.     Current Outpatient Medications (Other):  .  cholecalciferol (VITAMIN D) 1000 units tablet, Take 1,000 Units by mouth daily.  .  diclofenac sodium (VOLTAREN) 1 % GEL, Apply 2 g topically 4 (four) times daily. .  meclizine (ANTIVERT) 25 MG tablet, Take 1 tablet (25 mg total) by mouth 3 (three) times daily as needed for dizziness. .  Multiple Vitamins-Minerals (CENTRUM ADULTS PO), Take 1 tablet by mouth daily.  .  diclofenac Sodium (VOLTAREN) 1 % GEL, Apply 4 g topically 2 (two) times daily.    Past medical history, social, surgical and family history all reviewed in electronic medical record.  No pertanent information unless stated regarding to the chief complaint.   Review of Systems:  No headache, visual changes, nausea, vomiting, diarrhea, constipation, dizziness, abdominal pain, skin rash, fevers, chills, night sweats, weight loss, swollen lymph nodes, body aches, joint swelling, muscle aches, chest pain, shortness of breath, mood changes.   Objective  Blood pressure 110/72, pulse 73, height 5\' 4"  (1.626 m), weight 170 lb (77.1 kg), SpO2 94 %.    General: No apparent distress alert and oriented x3 mood and affect normal, dressed appropriately.  HEENT: Pupils equal, extraocular movements intact  Respiratory: Patient's speak in full sentences and does not appear short of breath  Cardiovascular: No lower extremity edema, non tender, no erythema  Skin: Warm dry intact with no signs of infection or rash on extremities or on axial skeleton.  Abdomen: Soft nontender  Neuro: Cranial nerves II through XII are intact, neurovascularly intact in all extremities with 2+ DTRs and 2+ pulses.  Lymph: No lymphadenopathy of posterior or anterior cervical chain or axillae bilaterally.  Gait antalgic gait MSK:  tender with limited range of motion and good  stability and symmetric strength and tone of shoulders, elbows, wrist, hip, and ankles bilaterally.  Knee: Bilateral valgus deformity noted. Large thigh to calf ratio.  Tender to palpation over medial and PF joint line.  ROM full in flexion and extension and lower leg rotation. instability with valgus force.  painful patellar compression. Patellar glide with moderate crepitus. Patellar and quadriceps tendons unremarkable. Hamstring and quadriceps strength is normal.   After informed written and verbal consent, patient was seated on exam table. Right knee was prepped with alcohol swab and utilizing anterolateral approach, patient's right knee space was injected with 4:1  marcaine 0.5%: Kenalog 40mg /dL. Patient tolerated the procedure well without immediate complications.  After informed written and verbal consent, patient was seated on exam table. Left knee was prepped with  alcohol swab and utilizing anterolateral approach, patient's left knee space was injected with 4:1  marcaine 0.5%: Kenalog 40mg /dL. Patient tolerated the procedure well without immediate complications.   Impression and Recommendations:     This case required medical decision making of moderate complexity. The above documentation has been reviewed and is accurate and complete , DO       Note: This dictation was prepared with Dragon dictation along with smaller phrase technology. Any transcriptional errors that result from this process are unintentional.

## 2019-10-22 ENCOUNTER — Encounter: Payer: Self-pay | Admitting: Family Medicine

## 2019-10-22 ENCOUNTER — Ambulatory Visit (INDEPENDENT_AMBULATORY_CARE_PROVIDER_SITE_OTHER): Payer: Medicare Other | Admitting: Family Medicine

## 2019-10-22 DIAGNOSIS — M170 Bilateral primary osteoarthritis of knee: Secondary | ICD-10-CM | POA: Diagnosis not present

## 2019-10-22 MED ORDER — DICLOFENAC SODIUM 1 % EX GEL: 4.0000 g | Freq: Two times a day (BID) | CUTANEOUS | 0 refills | Status: DC

## 2019-10-22 NOTE — Assessment & Plan Note (Addendum)
Repeat injection given today.  Tolerated the procedure well.  Discussed icing regimen and home exercise, discussed which activities to do which wants to avoid.  Can possibly do viscosupplementation again if necessary.  Follow-up again in 12 weeks spent  25 minutes with patient face-to-face and had greater than 50% of counseling including as described above in assessment and plan.

## 2019-10-22 NOTE — Patient Instructions (Signed)
  [address]st [address] [place], [place] [address] Phone [phone]  See me again in 3 months

## 2019-11-11 ENCOUNTER — Telehealth: Payer: Self-pay | Admitting: Family Medicine

## 2019-11-11 NOTE — Telephone Encounter (Signed)
PT came in office stating is needing medication refill Losartan 25 mg 1 1/2 tablet per day. Pt is needing meds to be sent to [facility] since pt only has 5 pills left and is needing refill ASAP since pt does not have any more meds but just 5. Please advise. (Pt for future meds refill it would be to [facility])

## 2019-11-12 MED ORDER — LOSARTAN POTASSIUM 25 MG PO TABS: 37.5000 mg | ORAL_TABLET | Freq: Every day | ORAL | 5 refills | Status: DC

## 2019-11-12 MED FILL — LOSARTAN POTASSIUM 25 MG TA: 25 | 30 days supply | Qty: 45 | Fill #0

## 2019-11-12 NOTE — Telephone Encounter (Signed)
Medication refilled

## 2019-11-27 ENCOUNTER — Telehealth: Payer: Self-pay | Admitting: Family Medicine

## 2019-11-27 DIAGNOSIS — I10 Essential (primary) hypertension: Secondary | ICD-10-CM

## 2019-11-27 MED ORDER — AMLODIPINE BESYLATE 5 MG PO TABS: 5.0000 mg | ORAL_TABLET | Freq: Every day | ORAL | 1 refills | Status: DC

## 2019-11-27 MED ORDER — LOSARTAN POTASSIUM 25 MG PO TABS: 37.5000 mg | ORAL_TABLET | Freq: Every day | ORAL | 5 refills | Status: DC

## 2019-11-27 NOTE — Telephone Encounter (Signed)
amLODipine (NORVASC) 5 MG tablet [[id number]]   losartan (COZAAR) 25 MG tablet [[id number]]    [employer] is requesting for Order Prescription [address]  [phone] [facility] [address]  Phone number  [phone]

## 2019-11-27 NOTE — Telephone Encounter (Signed)
Medication refilled

## 2019-12-01 ENCOUNTER — Encounter: Payer: Self-pay | Admitting: Family Medicine

## 2019-12-05 ENCOUNTER — Encounter: Payer: Self-pay | Admitting: Family Medicine

## 2020-01-21 ENCOUNTER — Other Ambulatory Visit: Payer: Self-pay

## 2020-01-21 ENCOUNTER — Encounter: Payer: Self-pay | Admitting: Family Medicine

## 2020-01-21 ENCOUNTER — Ambulatory Visit (INDEPENDENT_AMBULATORY_CARE_PROVIDER_SITE_OTHER): Payer: Medicare PPO | Admitting: Family Medicine

## 2020-01-21 DIAGNOSIS — M170 Bilateral primary osteoarthritis of knee: Secondary | ICD-10-CM

## 2020-01-21 MED ORDER — DICLOFENAC SODIUM 1 % EX GEL: 4.0000 g | Freq: Two times a day (BID) | CUTANEOUS | 1 refills | Status: DC

## 2020-01-21 NOTE — Assessment & Plan Note (Signed)
Bilateral injection given again today.  Social determinants of health including patient has difficulty with transportation.  Patient this is a chronic problem with exacerbation.  Near end-stage arthritis.  Prescription drug management with refill of Voltaren given.  Patient will increase activity slowly.  Follow-up again in 4 to 8 weeks

## 2020-01-21 NOTE — Patient Instructions (Addendum)
Good to see you Refilled voltaren See me again in 10 weeks

## 2020-01-21 NOTE — Progress Notes (Signed)
[place] [address] [place] Phone: [phone] Subjective:   I Katie Johnson am serving as a Education administrator for Dr. Hulan Saas.  This visit occurred during the SARS-CoV-2 public health emergency.  Safety protocols were in place, including screening questions prior to the visit, additional usage of staff PPE, and extensive cleaning of exam room while observing appropriate contact time as indicated for disinfecting solutions.   I'm seeing this patient by the request  of:  Copland, Gay Filler, MD  CC: Bilateral knee pain  VFI:[id number]   10/22/2019 Repeat injection given today.  Tolerated the procedure well.  Discussed icing regimen and home exercise, discussed which activities to do which wants to avoid.  Can possibly do viscosupplementation again if necessary.  Follow-up again in 12 weeks spent  25 minutes with patient face-to-face and had greater than 50% of counseling including as described above in assessment and plan.  01/21/2020 Katie Johnson is a 84 y.o. female coming in with complaint of bilateral knee pain. Patient states the knees have been painful. Believes the weather is playing a roll. Patient would like bilateral injections.  Patient has known severe end-stage arthritis of the knees bilaterally.  Continues to have difficulty overall.  Home exercises has been doing relatively well but has had more difficulty secondary to the weather.     Past Medical History:  Diagnosis Date  . Arthritis   . Chicken pox   . GERD (gastroesophageal reflux disease)   . High blood pressure   . Hypertension    Past Surgical History:  Procedure Laterality Date  . COLONOSCOPY     Dr Carol Ada before 2010 per patient   . ESOPHAGOGASTRODUODENOSCOPY     over 20 years ago   Social History   Socioeconomic History  . Marital status: Married    Spouse name: Not on file  . Number of children: Not on file  . Years of education: Not on file  .  Highest education level: Not on file  Occupational History  . Occupation: Psychologist, occupational at [employer]  . Smoking status: Never Smoker  . Smokeless tobacco: Never Used  Substance and Sexual Activity  . Alcohol use: Not Currently  . Drug use: Never  . Sexual activity: Not on file  Other Topics Concern  . Not on file  Social History Narrative  . Not on file   Social Determinants of Health   Financial Resource Strain:   . Difficulty of Paying Living Expenses:   Food Insecurity:   . Worried About Charity fundraiser in the Last Year:   . Arboriculturist in the Last Year:   Transportation Needs:   . Film/video editor (Medical):   Marland Kitchen Lack of Transportation (Non-Medical):   Physical Activity:   . Days of Exercise per Week:   . Minutes of Exercise per Session:   Stress:   . Feeling of Stress :   Social Connections:   . Frequency of Communication with Friends and Family:   . Frequency of Social Gatherings with Friends and Family:   . Attends Religious Services:   . Active Member of Clubs or Organizations:   . Attends Archivist Meetings:   Marland Kitchen Marital Status:    Allergies  Allergen Reactions  . Shellfish-Derived Products Anaphylaxis  . Statins Diarrhea and Nausea And Vomiting  . Amlodipine Swelling    Foot swelling only   . Benazepril  High Blood Pressure 209/93  . Pravastatin Diarrhea  . Ketoprofen Rash   Family History  Problem Relation Age of Onset  . COPD Mother   . Heart disease Mother   . Diabetes Father   . Heart disease Father   . Colon cancer Neg Hx   . Esophageal cancer Neg Hx      Current Outpatient Medications (Cardiovascular):  .  amLODipine (NORVASC) 5 MG tablet, Take 1 tablet (5 mg total) by mouth daily. Marland Kitchen  losartan (COZAAR) 25 MG tablet, Take 1.5 tablets (37.5 mg total) by mouth daily.     Current Outpatient Medications (Other):  .  cholecalciferol (VITAMIN D) 1000 units tablet, Take 1,000 Units by mouth daily.  .   diclofenac sodium (VOLTAREN) 1 % GEL, Apply 2 g topically 4 (four) times daily. .  meclizine (ANTIVERT) 25 MG tablet, Take 1 tablet (25 mg total) by mouth 3 (three) times daily as needed for dizziness. .  Multiple Vitamins-Minerals (CENTRUM ADULTS PO), Take 1 tablet by mouth daily.  .  diclofenac Sodium (VOLTAREN) 1 % GEL, Apply 4 g topically 2 (two) times daily.   Reviewed prior external information including notes and imaging from  primary care provider As well as notes that were available from care everywhere and other healthcare systems.  Past medical history, social, surgical and family history all reviewed in electronic medical record.  No pertanent information unless stated regarding to the chief complaint.   Review of Systems:  No headache, visual changes, nausea, vomiting, diarrhea, constipation, dizziness, abdominal pain, skin rash, fevers, chills, night sweats, weight loss, swollen lymph nodes, body aches, joint swelling, chest pain, shortness of breath, mood changes. POSITIVE muscle aches  Objective  Blood pressure (!) 142/80, pulse 68, height 5\' 4"  (1.626 m), weight 171 lb (77.6 kg), SpO2 98 %.   General: No apparent distress alert and oriented x3 mood and affect normal, dressed appropriately.  HEENT: Pupils equal, extraocular movements intact  Respiratory: Patient's speak in full sentences and does not appear short of breath  Cardiovascular: Trace lower extremity edema, non tender, no erythema  Neuro: Cranial nerves II through XII are intact, neurovascularly intact in all extremities with 2+ DTRs and 2+ pulses.  Lymph: No lymphadenopathy of posterior or anterior cervical chain or axillae bilaterally.  Gait antalgic Knee: Bilateral valgus deformity noted.  Abnormal thigh to calf ratio.  Tender to palpation over medial and PF joint line.  ROM full in flexion and extension and lower leg rotation. instability with valgus force.  painful patellar compression. Patellar glide  with moderate crepitus. Patellar and quadriceps tendons unremarkable. Hamstring and quadriceps strength is normal.  After informed written and verbal consent, patient was seated on exam table. Right knee was prepped with alcohol swab and utilizing anterolateral approach, patient's right knee space was injected with 4:1  marcaine 0.5%: Kenalog 40mg /dL. Patient tolerated the procedure well without immediate complications.  After informed written and verbal consent, patient was seated on exam table. Left knee was prepped with alcohol swab and utilizing anterolateral approach, patient's left knee space was injected with 4:1  marcaine 0.5%: Kenalog 40mg /dL. Patient tolerated the procedure well without immediate complications.   Impression and Recommendations:     This case required medical decision making of moderate complexity. The above documentation has been reviewed and is accurate and complete , DO       Note: This dictation was prepared with Dragon dictation along with smaller phrase technology. Any transcriptional errors that result from  this process are unintentional.

## 2020-01-29 ENCOUNTER — Other Ambulatory Visit: Payer: Self-pay

## 2020-01-29 ENCOUNTER — Ambulatory Visit (INDEPENDENT_AMBULATORY_CARE_PROVIDER_SITE_OTHER): Payer: Medicare PPO

## 2020-01-29 ENCOUNTER — Ambulatory Visit: Payer: Self-pay

## 2020-01-29 ENCOUNTER — Ambulatory Visit: Payer: Medicare PPO | Admitting: Orthopedic Surgery

## 2020-01-29 ENCOUNTER — Telehealth: Payer: Self-pay

## 2020-01-29 DIAGNOSIS — M25561 Pain in right knee: Secondary | ICD-10-CM

## 2020-01-29 DIAGNOSIS — G8929 Other chronic pain: Secondary | ICD-10-CM

## 2020-01-29 DIAGNOSIS — M25562 Pain in left knee: Secondary | ICD-10-CM

## 2020-01-29 NOTE — Telephone Encounter (Signed)
Patient would like to get approved for gel inejction that is not durolane.

## 2020-01-30 ENCOUNTER — Telehealth: Payer: Self-pay

## 2020-01-30 ENCOUNTER — Encounter: Payer: Self-pay | Admitting: Orthopedic Surgery

## 2020-01-30 NOTE — Progress Notes (Signed)
Office Visit Note   Patient: Katie Johnson           Date of Birth: 1936/08/25           MRN: [id number] Visit Date: 01/29/2020 Requested by: Pearline Cables, MD [address] [place],  [place] [address] PCP: Pearline Cables, MD  Subjective: Chief Complaint  Patient presents with  . Left Knee - Pain  . Right Knee - Pain    HPI: And is a patient with bilateral knee pain left worse than right.  She reports chronic pain but is not debilitating.  Denies any weakness giving way or locking or popping.  She has been getting some injections in the knees.  This has been done through anterior approach.  She does do bicycle and yoga daily.  She states that she had some type of reaction to dural Lane gel injection.  The pain does not wake her from sleep at night and it really does not limit her walking endurance.              ROS: All systems reviewed are negative as they relate to the chief complaint within the history of present illness.  Patient denies  fevers or chills.   Assessment & Plan: Visit Diagnoses:  1. Chronic pain of both knees     Plan: Impression is moderate knee arthritis worse in the patellofemoral compartment.  No real effusion in either knee today.  We discussed at length treatment options.  She does not really want total knee replacement and I do not think her clinical symptoms are there for that intervention.  I think it might be reasonable to try a different gel injection through superior lateral approach to the knee.  We will get that set up for 3 weeks and see how she does with that.  I would agree that knee replacement based on her clinical symptoms is not really in her future based on her reported symptoms. This patient is diagnosed with osteoarthritis of the knee(s).    Radiographs show evidence of joint space narrowing, osteophytes, subchondral sclerosis and/or subchondral cysts.  This patient has knee pain which interferes with functional and  activities of daily living.    This patient has experienced inadequate response, adverse effects and/or intolerance with conservative treatments such as acetaminophen, NSAIDS, topical creams, physical therapy or regular exercise, knee bracing and/or weight loss.   This patient has experienced inadequate response or has a contraindication to intra articular steroid injections for at least 3 months.   This patient is not scheduled to have a total knee replacement within 6 months of starting treatment with viscosupplementation.   Follow-Up Instructions: Return in about 3 weeks (around 02/19/2020).   Orders:  Orders Placed This Encounter  Procedures  . XR Knee 1-2 Views Right  . XR KNEE 3 VIEW LEFT   No orders of the defined types were placed in this encounter.     Procedures: No procedures performed   Clinical Data: No additional findings.  Objective: Vital Signs: There were no vitals taken for this visit.  Physical Exam:   Constitutional: Patient appears well-developed HEENT:  Head: Normocephalic Eyes:EOM are normal Neck: Normal range of motion Cardiovascular: Normal rate Pulmonary/chest: Effort normal Neurologic: Patient is alert Skin: Skin is warm Psychiatric: Patient has normal mood and affect    Ortho Exam: Ortho exam demonstrates pretty normal gait alignment.  She has excellent range of motion both knees with no flexion contracture.  Flexes easily past 90.  Mild patellofemoral crepitus is present.  No masses lymphadenopathy or skin changes noted in the knee region.  Collateral and cruciate ligaments are stable.  Not much in the way of medial or joint line tenderness bilaterally.  Specialty Comments:  No specialty comments available.  Imaging: No results found.   PMFS History: Patient Active Problem List   Diagnosis Date Noted  . Greater trochanteric bursitis of left hip 07/23/2019  . Acute respiratory failure with hypoxia ([place]) 10/03/2018  . CAP  (community acquired pneumonia) 10/03/2018  . Hyponatremia 10/03/2018  . CKD (chronic kidney disease) stage 3, GFR 30-59 ml/min 10/03/2018  . Generalized weakness 10/03/2018  . Hypophosphatemia 10/03/2018  . Abnormal LFTs 10/03/2018  . Macrocytic anemia 10/03/2018  . Sepsis ([place]) 09/30/2018  . AC (acromioclavicular) arthritis 12/21/2017  . Essential hypertension 11/28/2017  . Degenerative arthritis of knee, bilateral 07/15/2015  . De Quervain's disease (tenosynovitis) 09/15/2011  . METATARSALGIA 07/14/2010  . BUNIONS, BILATERAL 07/14/2010  . FOOT PAIN, BILATERAL 07/14/2010  . BACK PAIN, THORACIC REGION 03/30/2010   Past Medical History:  Diagnosis Date  . Arthritis   . Chicken pox   . GERD (gastroesophageal reflux disease)   . High blood pressure   . Hypertension     Family History  Problem Relation Age of Onset  . COPD Mother   . Heart disease Mother   . Diabetes Father   . Heart disease Father   . Colon cancer Neg Hx   . Esophageal cancer Neg Hx     Past Surgical History:  Procedure Laterality Date  . COLONOSCOPY     Dr Carol Ada before 2010 per patient   . ESOPHAGOGASTRODUODENOSCOPY     over 20 years ago   Social History   Occupational History  . Occupation: Psychologist, occupational at [employer]  . Smoking status: Never Smoker  . Smokeless tobacco: Never Used  Substance and Sexual Activity  . Alcohol use: Not Currently  . Drug use: Never  . Sexual activity: Not on file

## 2020-01-30 NOTE — Telephone Encounter (Signed)
Need to call pt to see when last injection of Durolane was done. Not recently done with [facility]

## 2020-01-30 NOTE — Telephone Encounter (Signed)
error 

## 2020-01-30 NOTE — Telephone Encounter (Signed)
I spoke with the pt and last injection was over two years ago   Submitted for VOB for Monovisc-Bilateral knee

## 2020-01-31 ENCOUNTER — Telehealth: Payer: Self-pay

## 2020-01-31 NOTE — Telephone Encounter (Signed)
Approved for Monovisc-Bilateral knee Dr. Myrtie Cruise and Bill $40 copay No OOP Auth required with [employer] auth # [id number] Dates: 01/30/20-08/01/20

## 2020-01-31 NOTE — Telephone Encounter (Signed)
LVM for pt to call back to inform of approval and copay and confirm appointment date and time.

## 2020-02-03 ENCOUNTER — Telehealth: Payer: Self-pay | Admitting: Orthopedic Surgery

## 2020-02-03 NOTE — Telephone Encounter (Signed)
Left another voice mail

## 2020-02-03 NOTE — Telephone Encounter (Signed)
Pt called returning Autumns call; I notified her that she had been approved for the monovisc and that an appt had been made for her on 02/19/20 @ 3:15 and I made the pt aware of the $40 copay.

## 2020-02-03 NOTE — Telephone Encounter (Signed)
Thank you :)

## 2020-02-06 ENCOUNTER — Telehealth: Payer: Self-pay | Admitting: Family Medicine

## 2020-02-06 NOTE — Telephone Encounter (Signed)
[facility] called to get authorization for medication diclofeace sodium , spoke with Lessie Dings , that was faxed to Dr. Terrilee Files in [place]- he was the last to refill it.

## 2020-02-13 ENCOUNTER — Other Ambulatory Visit: Payer: Self-pay | Admitting: *Deleted

## 2020-02-13 MED ORDER — DICLOFENAC SODIUM 1 % EX GEL: 4.0000 g | Freq: Two times a day (BID) | CUTANEOUS | 1 refills | Status: DC

## 2020-02-19 ENCOUNTER — Other Ambulatory Visit: Payer: Self-pay

## 2020-02-19 ENCOUNTER — Ambulatory Visit: Payer: Medicare PPO | Admitting: Orthopedic Surgery

## 2020-02-19 DIAGNOSIS — M170 Bilateral primary osteoarthritis of knee: Secondary | ICD-10-CM

## 2020-02-21 ENCOUNTER — Encounter: Payer: Self-pay | Admitting: Orthopedic Surgery

## 2020-02-21 DIAGNOSIS — M170 Bilateral primary osteoarthritis of knee: Secondary | ICD-10-CM | POA: Diagnosis not present

## 2020-02-21 MED ORDER — LIDOCAINE HCL 1 % IJ SOLN
5.0000 mL | INTRAMUSCULAR | Status: AC | PRN
  Administered 2020-02-21: 07:00:00 5 mL

## 2020-02-21 MED ORDER — HYALURONAN 88 MG/4ML IX SOSY
88.0000 mg | PREFILLED_SYRINGE | INTRA_ARTICULAR | Status: AC | PRN
  Administered 2020-02-21: 88 mg via INTRA_ARTICULAR

## 2020-02-21 MED ORDER — LIDOCAINE HCL 1 % IJ SOLN
5.0000 mL | INTRAMUSCULAR | Status: AC | PRN
  Administered 2020-02-21: 5 mL

## 2020-02-21 MED ORDER — HYALURONAN 88 MG/4ML IX SOSY
88.0000 mg | PREFILLED_SYRINGE | INTRA_ARTICULAR | Status: AC | PRN
  Administered 2020-02-21: 07:00:00 88 mg via INTRA_ARTICULAR

## 2020-02-21 NOTE — Progress Notes (Signed)
   Procedure Note  Patient: Katie Johnson             Date of Birth: 1936/10/09           MRN: [id number]             Visit Date: 02/19/2020  Procedures: Visit Diagnoses:  1. Primary osteoarthritis of both knees     Large Joint Inj: bilateral knee on 02/21/2020 7:26 AM Indications: pain, joint swelling and diagnostic evaluation Details: 18 G 1.5 in needle, superolateral approach  Arthrogram: No  Medications (Right): 5 mL lidocaine 1 %; 88 mg Hyaluronan 88 MG/4ML Medications (Left): 5 mL lidocaine 1 %; 88 mg Hyaluronan 88 MG/4ML Outcome: tolerated well, no immediate complications Procedure, treatment alternatives, risks and benefits explained, specific risks discussed. Consent was given by the patient. Immediately prior to procedure a time out was called to verify the correct patient, procedure, equipment, support staff and site/side marked as required. Patient was prepped and draped in the usual sterile fashion.

## 2020-03-19 ENCOUNTER — Other Ambulatory Visit: Payer: Self-pay

## 2020-03-20 ENCOUNTER — Encounter: Payer: Self-pay | Admitting: Internal Medicine

## 2020-03-20 ENCOUNTER — Ambulatory Visit: Payer: Medicare PPO | Admitting: Internal Medicine

## 2020-03-20 ENCOUNTER — Other Ambulatory Visit: Payer: Self-pay

## 2020-03-20 VITALS — BP 150/71 | HR 76 | Temp 95.3°F | Resp 18 | Ht 64.0 in | Wt 167.4 lb

## 2020-03-20 DIAGNOSIS — M21962 Unspecified acquired deformity of left lower leg: Secondary | ICD-10-CM

## 2020-03-20 DIAGNOSIS — M21961 Unspecified acquired deformity of right lower leg: Secondary | ICD-10-CM

## 2020-03-20 DIAGNOSIS — T148XXA Other injury of unspecified body region, initial encounter: Secondary | ICD-10-CM | POA: Diagnosis not present

## 2020-03-20 MED ORDER — CEPHALEXIN 500 MG PO CAPS: 500.0000 mg | ORAL_CAPSULE | Freq: Three times a day (TID) | ORAL | 0 refills | Status: DC

## 2020-03-20 NOTE — Progress Notes (Signed)
Subjective:    Patient ID: Katie Johnson, female    DOB: 1936/03/30, 84 y.o.   MRN: [id number]  DOS:  03/20/2020 Type of visit - description: Acute 3 days ago noted a blister at the left fourth toe. Has soak it in water but nothing has changed. Only painful when she walks. Denies any injury, discharge. She usually use tennis shoes.  Also, has had preulcerative callus at the second toe and is somewhat concerned about it    Review of Systems See above   Past Medical History:  Diagnosis Date  . Arthritis   . Chicken pox   . GERD (gastroesophageal reflux disease)   . High blood pressure   . Hypertension     Past Surgical History:  Procedure Laterality Date  . COLONOSCOPY     Dr Jeani Hawking before 2010 per patient   . ESOPHAGOGASTRODUODENOSCOPY     over 20 years ago    Allergies as of 03/20/2020      Reactions   Shellfish-derived Products Anaphylaxis   Statins Diarrhea, Nausea And Vomiting   Amlodipine Swelling   Foot swelling only    Benazepril    High Blood Pressure 209/93   Pravastatin Diarrhea   Ketoprofen Rash      Medication List       Accurate as of Mar 20, 2020 12:40 PM. If you have any questions, ask your nurse or doctor.        amLODipine 5 MG tablet Commonly known as: NORVASC Take 1 tablet (5 mg total) by mouth daily.   CENTRUM ADULTS PO Take 1 tablet by mouth daily.   cephALEXin 500 MG capsule Commonly known as: KEFLEX Take 1 capsule (500 mg total) by mouth 3 (three) times daily. Started by: Willow Ora, MD   cholecalciferol 1000 units tablet Commonly known as: VITAMIN D Take 1,000 Units by mouth daily.   diclofenac Sodium 1 % Gel Commonly known as: Voltaren Apply 4 g topically 2 (two) times daily.   losartan 25 MG tablet Commonly known as: Cozaar Take 1.5 tablets (37.5 mg total) by mouth daily.   meclizine 25 MG tablet Commonly known as: ANTIVERT Take 1 tablet (25 mg total) by mouth 3 (three) times daily as needed for dizziness.          Objective:   Physical Exam BP (!) 150/71 (BP Location: Left Arm, Patient Position: Sitting, Cuff Size: Small)   Pulse 76   Temp (!) 95.3 F (35.2 C) (Temporal)   Resp 18   Ht 5\' 4"  (1.626 m)   Wt 167 lb 6 oz (75.9 kg)   SpO2 97%   BMI 28.73 kg/m  General:   Well developed, NAD, BMI noted. HEENT:  Normocephalic . Face symmetric, atraumatic Left foot: Multiple deformities including bunion and hammertoes. At the inner aspect of the fourth toe has a blister about 8 x 4 mm in size, filled with clear fluid, no redness, no discharge. At the knee left second toe has a preulcerative callus Lower extremities: no pretibial edema bilaterally  Skin: Not pale. Not jaundice Neurologic:  alert & oriented X3.  Speech normal, gait appropriate for age and unassisted Psych--  Cognition and judgment appear intact.  Cooperative with normal attention span and concentration.  Behavior appropriate. No anxious or depressed appearing.      Assessment      84 year old female, PMH includes HTN, DJD, history of sepsis in the past, presents with:  Blister, fourth left toe: Likely the toe is  rubbing against the close shoes she usually wears, no evidence of infection, recommend to use open toed shoes, cover blister  with a Band-Aid if it burst, take an antibiotic if it ever gets redness or swollen. Multiple foot deformities with preulcerative callus: Refer to a podiatrist.  She might benefit from special shoes.   This visit occurred during the SARS-CoV-2 public health emergency.  Safety protocols were in place, including screening questions prior to the visit, additional usage of staff PPE, and extensive cleaning of exam room while observing appropriate contact time as indicated for disinfecting solutions.

## 2020-03-20 NOTE — Patient Instructions (Signed)
For the next few days, use open toe shoes  If the blister burst, cover the area with a Band-Aid  If the area ever gets red, swollen, hot: Start the antibiotic called Keflex and let us know  We are referring you to a podiatrist

## 2020-03-20 NOTE — Progress Notes (Signed)
Pre visit review using our clinic review tool, if applicable. No additional management support is needed unless otherwise documented below in the visit note. 

## 2020-03-30 MED FILL — MOXIFLOXACIN HCL 0.5 % SOLN: 0.5 | 19 days supply | Qty: 3 | Fill #0

## 2020-03-30 MED FILL — PREDNISOLONE AC 1% EYE DROP: 1 | 25 days supply | Qty: 5 | Fill #0

## 2020-03-31 ENCOUNTER — Ambulatory Visit: Payer: Medicare PPO | Admitting: Family Medicine

## 2020-04-02 ENCOUNTER — Ambulatory Visit: Payer: Medicare PPO | Admitting: Sports Medicine

## 2020-04-08 MED FILL — TIMOLOL MALEATE 0.5 % SOLN: 0.5 | 50 days supply | Qty: 5 | Fill #0

## 2020-04-21 ENCOUNTER — Other Ambulatory Visit: Payer: Self-pay

## 2020-04-21 ENCOUNTER — Ambulatory Visit (INDEPENDENT_AMBULATORY_CARE_PROVIDER_SITE_OTHER): Payer: Medicare PPO | Admitting: Sports Medicine

## 2020-04-21 DIAGNOSIS — M170 Bilateral primary osteoarthritis of knee: Secondary | ICD-10-CM

## 2020-04-21 MED ORDER — LOSARTAN POTASSIUM 25 MG PO TABS: 50.0000 mg | ORAL_TABLET | Freq: Every day | ORAL | 0 refills | Status: DC

## 2020-04-21 NOTE — Progress Notes (Signed)
   Katie Johnson is a 84 y.o. female who presents to [facility] today for the following:  Bilateral knee pain Patient presenting for bilateral knee pain. Patient has been seen by both Dr. Katrinka Johnson and Dr. August Johnson for this in the past. States that pain has been present for years but has been debilitating for past 10 years. L>R. States she does note some instability but no locking. Reports she tries to stay active, rides stationary bike daily, yoga, gardening, golf and she used to play tennis. Is using a bauerfiend genutrain knee brace which allows her to play golf and garden. Is using tylenol which helps alleviate pain as well as voltaren gel which helps. Denies trauma or falls. States pain is worse with climbing steps or moving from sitting to standing position. In the past has been getting injections q3 months x3 years. Has tried both cortisone as well as monovisc injections. Was unable to tolerate tramadol 2/2 dizziness.    PMH reviewed. HTN, metatarsalgia, greater tronchanteric bursitis of left hip, degenerative arthritis of knees bilaterally, de quervains, ckd stage 3  ROS as above. Medications reviewed. Note substantial relief with 1 tylenol in morning for pain Night pain does not waken her  Exam:  BP (!) 142/72   Ht 5\' 4"  (1.626 m)   Wt 164 lb (74.4 kg)   BMI 28.15 kg/m  Gen: Well NAD MSK: Knee: - Inspection: some "squaring of contour' and bony hypertrophy. Swelling bilaterally in SPP area. No erythema or bruising. Skin intact - Palpation: TTP of lateral joint line bilaterally  - ROM: full active ROM with flexion and extension in knee and hip. Knee extension to -3 degrees. Knee flexion to 160 degrees - Strength: 5/5 strength - Neuro/vasc: NV intact - Special Tests: - LIGAMENTS: negative anterior and posterior drawer, no MCL or LCL laxity  -- MENISCUS: negative McMurray's  -- PF JOINT: nml patellar mobility bilaterally.  negative patellar grind, negative patellar apprehension  Hips: normal  ROM, negative FABER and FADIR bilaterally  Review of XRs of knee 3/21 Most OA is located in the PF compartment and is advanced.  However, medical and lateral joint space is preserved 4/21, MD   Assessment and Plan: 1) Degenerative arthritis of knee, bilateral Patient with bilateral arthritis of knees. Has some improvement of pain with OTC tylenol and voltaren gel. Patient with normal ROM so not a candidate for knee replacements at this time. Would recommend scheduled tylenol bid (in AM and at lunch) to help allow more function throughout the day. Patient is very active and encouraged to continue her daily exercise. Advised daily isometric quadriceps exercises as well. Tramadol caused dizziness so will avoid. Patient with CKD3 so will need to be cautious with NSAIDs. Patient with knee brace but advised trying one size smaller to help provide increased compression. F/u in 1 month to ensure improvement in pain and function.    Katie Johnson, PGY-3 [facility] Resident  04/21/2020 11:21 AM  I observed and examined the patient with the resident and agree with assessment and plan.  Note reviewed and modified by me. 04/23/2020, MD

## 2020-04-21 NOTE — Assessment & Plan Note (Addendum)
Patient with bilateral arthritis of knees. Has some improvement of pain with OTC tylenol and voltaren gel. Patient with normal ROM so not a candidate for knee replacements at this time. Would recommend scheduled tylenol bid (in AM and at lunch) to help allow more function throughout the day. Patient is very active and encouraged to continue her daily exercise. Advised daily isometric quadriceps exercises as well. Tramadol caused dizziness so will avoid. Patient with CKD3 so will need to be cautious with NSAIDs. Patient with knee brace but advised trying one size smaller to help provide increased compression. F/u in 1 month to ensure improvement in pain and function.

## 2020-04-21 NOTE — Patient Instructions (Addendum)
1. Please take tylenol twice a day: once in the morning when you get up and once ~noon/1pm (lunch time)  2. Please do the isometric quadriceps exercises we discussed daily. I have provided instructions below.  3. Continue your other home exercises  4. Please see if you can get a medium sized knee brace to help provide some extra compression  5. Follow up with Dr. Darrick Penna in 1 month   Quadriceps, Isometric (Strength) Man sitting against wall doing quadriceps sets.This exercise is for an injured right knee. Switch sides if the injury is to your left knee.  Sit on the floor with your right leg straight in front of you. Bend your left knee up and put your left foot flat on the floor.  Flex your right foot and tighten the thigh muscles of your right leg. Press the back of your right knee toward the floor. Don't arch your back or hunch your shoulders.  Hold for 5 to 10 seconds. Then relax.  Repeat 10 times, or as instructed.  Do this exercise 3 times a day, or as instructed.

## 2020-05-13 ENCOUNTER — Encounter: Payer: Self-pay | Admitting: Family Medicine

## 2020-05-14 NOTE — Patient Instructions (Addendum)
It was great to see you again today, I will be in touch your labs as soon as possible Your BP looks great!  I will be in touch with your labs asap We will set you up with a Cologuard kit to screen for colon cancer I would also suggest a mammogram for breast cancer screening

## 2020-05-14 NOTE — Progress Notes (Addendum)
[facility] [address], [address] [place], [place] [address] [phone]  Date:  05/18/2020   Name:  Katie Johnson   DOB:  05-Aug-1936   MRN:  [id number]  PCP:  Pearline Cables, MD    Chief Complaint: Hypertension   History of Present Illness:  Katie Johnson is a 84 y.o. very pleasant female patient who presents with the following:  Patient with history of hypertension, chronic kidney disease Here today for a follow-up visit Last seen by myself in August 2020 Last year we had difficulty with volatile blood pressure, and concern of TIA/stroke symptoms.  She had an MRI of her brain which was normal  She has been under the care of ophthalmology for cataracts; she had one out so far and she will have the 2nd eye done next month  She is not having any trouble with her BP right now, she is taking her medications as directed and her BP has been well controlled  Covid series complete Shingrix complete Mammogram 2019- will order for her  DEXA scan Colon cancer screening- she did a colonoscopy 20 years ago, would like to do a cologuard after discussion of options Due for routine labs today  Amlodipine 5 Losartan 50 voltaren gel for knee pain  She does use OTC tylenol as needed for pain as well A knee sleeve is also helpful for her  Patient Active Problem List   Diagnosis Date Noted  . Greater trochanteric bursitis of left hip 07/23/2019  . Acute respiratory failure with hypoxia (HCC) 10/03/2018  . CAP (community acquired pneumonia) 10/03/2018  . Hyponatremia 10/03/2018  . CKD (chronic kidney disease) stage 3, GFR 30-59 ml/min 10/03/2018  . Generalized weakness 10/03/2018  . Hypophosphatemia 10/03/2018  . Abnormal LFTs 10/03/2018  . Macrocytic anemia 10/03/2018  . Sepsis (HCC) 09/30/2018  . AC (acromioclavicular) arthritis 12/21/2017  . Essential hypertension 11/28/2017  . Degenerative arthritis of knee, bilateral 07/15/2015   . De Quervain's disease (tenosynovitis) 09/15/2011  . METATARSALGIA 07/14/2010  . BUNIONS, BILATERAL 07/14/2010  . FOOT PAIN, BILATERAL 07/14/2010  . BACK PAIN, THORACIC REGION 03/30/2010    Past Medical History:  Diagnosis Date  . Arthritis   . Chicken pox   . GERD (gastroesophageal reflux disease)   . High blood pressure   . Hypertension     Past Surgical History:  Procedure Laterality Date  . COLONOSCOPY     Dr Jeani Hawking before 2010 per patient   . ESOPHAGOGASTRODUODENOSCOPY     over 20 years ago    Social History   Tobacco Use  . Smoking status: Never Smoker  . Smokeless tobacco: Never Used  Vaping Use  . Vaping Use: Never used  Substance Use Topics  . Alcohol use: Not Currently  . Drug use: Never    Family History  Problem Relation Age of Onset  . COPD Mother   . Heart disease Mother   . Diabetes Father   . Heart disease Father   . Colon cancer Neg Hx   . Esophageal cancer Neg Hx     Allergies  Allergen Reactions  . Shellfish-Derived Products Anaphylaxis  . Statins Diarrhea and Nausea And Vomiting  . Amlodipine Swelling    Foot swelling only   . Benazepril     High Blood Pressure 209/93  . Pravastatin Diarrhea  . Ketoprofen Rash    Medication list has been reviewed and updated.  Current Outpatient Medications  on File Prior to Visit  Medication Sig Dispense Refill  . amLODipine (NORVASC) 5 MG tablet Take 1 tablet (5 mg total) by mouth daily. 90 tablet 1  . cholecalciferol (VITAMIN D) 1000 units tablet Take 1,000 Units by mouth daily.     . diclofenac Sodium (VOLTAREN) 1 % GEL Apply 4 g topically 2 (two) times daily. 700 g 1  . losartan (COZAAR) 25 MG tablet Take 2 tablets (50 mg total) by mouth daily. NEEDS OV FOR FURTHER REFILLS 180 tablet 0  . Multiple Vitamins-Minerals (CENTRUM ADULTS PO) Take 1 tablet by mouth daily.      No current facility-administered medications on file prior to visit.    Review of Systems:  As per HPI-  otherwise negative.   Physical Examination: Vitals:   05/18/20 1417  BP: 128/80  Pulse: 76  Resp: 18  SpO2: 98%   Vitals:   05/18/20 1417  Weight: 169 lb (76.7 kg)  Height: 5\' 4"  (1.626 m)   Body mass index is 29.01 kg/m. Ideal Body Weight: Weight in (lb) to have BMI = 25: 145.3  GEN: no acute distress.  Overweight, looks well  HEENT: Atraumatic, Normocephalic.  Ears and Nose: No external deformity. CV: RRR, No M/G/R. No JVD. No thrill. No extra heart sounds. PULM: CTA B, no wheezes, crackles, rhonchi. No retractions. No resp. distress. No accessory muscle use. EXTR: No c/c/e PSYCH: Normally interactive. Conversant.    Assessment and Plan: Essential hypertension - Plan: CBC, Comprehensive metabolic panel, losartan (COZAAR) 50 MG tablet  Decreased GFR - Plan: Comprehensive metabolic panel  Mild anemia - Plan: CBC  Screening for hyperlipidemia - Plan: Lipid panel  Screening for colon cancer  Screening mammogram, encounter for - Plan: MM 3D SCREEN BREAST BILATERAL  Following up today BP under good control Changed losartan to one 50 mg tablet as this is the amount she is taking Check labs Order cologuard and mamogram Plan to recheck in 6 months assuming all is well   This visit occurred during the SARS-CoV-2 public health emergency.  Safety protocols were in place, including screening questions prior to the visit, additional usage of staff PPE, and extensive cleaning of exam room while observing appropriate contact time as indicated for disinfecting solutions.    Signed , MD  Addendum 7/13, received her labs as below.  Message to patient  Results for orders placed or performed in visit on 05/18/20  CBC  Result Value Ref Range   WBC 7.2 4.0 - 10.5 K/uL   RBC 3.88 3.87 - 5.11 Mil/uL   Platelets 200.0 150 - 400 K/uL   Hemoglobin 13.1 12.0 - 15.0 g/dL   HCT 07/19/20 36 - 46 %   MCV 97.7 78.0 - 100.0 fl   MCHC 34.6 30.0 - 36.0 g/dL   RDW 63.1 49.7  - 02.6 %  Comprehensive metabolic panel  Result Value Ref Range   Sodium 136 135 - 145 mEq/L   Potassium 4.4 3.5 - 5.1 mEq/L   Chloride 102 96 - 112 mEq/L   CO2 26 19 - 32 mEq/L   Glucose, Bld 97 70 - 99 mg/dL   BUN 27 (H) 6 - 23 mg/dL   Creatinine, Ser 37.8 0.40 - 1.20 mg/dL   Total Bilirubin 0.4 0.2 - 1.2 mg/dL   Alkaline Phosphatase 69 39 - 117 U/L   AST 18 0 - 37 U/L   ALT 19 0 - 35 U/L   Total Protein 7.2 6.0 - 8.3 g/dL  Albumin 4.5 3.5 - 5.2 g/dL   GFR 67.73 (L) >73.66 mL/min   Calcium 9.7 8.4 - 10.5 mg/dL  Lipid panel  Result Value Ref Range   Cholesterol 231 (H) 0 - 200 mg/dL   Triglycerides 815.9 (H) 0 - 149 mg/dL   HDL 47.07 >61.51 mg/dL   VLDL 83.4 (H) 0.0 - 37.3 mg/dL   Total CHOL/HDL Ratio 4    NonHDL 165.20   LDL cholesterol, direct  Result Value Ref Range   Direct LDL 114.0 mg/dL

## 2020-05-15 MED FILL — LOSARTAN POTASSIUM 25 MG TA: 25 | 30 days supply | Qty: 45 | Fill #2

## 2020-05-18 ENCOUNTER — Encounter: Payer: Self-pay | Admitting: Family Medicine

## 2020-05-18 ENCOUNTER — Other Ambulatory Visit: Payer: Self-pay

## 2020-05-18 ENCOUNTER — Ambulatory Visit: Payer: Medicare PPO | Admitting: Family Medicine

## 2020-05-18 ENCOUNTER — Other Ambulatory Visit: Payer: Self-pay | Admitting: Family Medicine

## 2020-05-18 VITALS — BP 128/80 | HR 76 | Resp 18 | Ht 64.0 in | Wt 169.0 lb

## 2020-05-18 DIAGNOSIS — I10 Essential (primary) hypertension: Secondary | ICD-10-CM

## 2020-05-18 DIAGNOSIS — Z1211 Encounter for screening for malignant neoplasm of colon: Secondary | ICD-10-CM | POA: Diagnosis not present

## 2020-05-18 DIAGNOSIS — Z1231 Encounter for screening mammogram for malignant neoplasm of breast: Secondary | ICD-10-CM | POA: Diagnosis not present

## 2020-05-18 DIAGNOSIS — Z13220 Encounter for screening for lipoid disorders: Secondary | ICD-10-CM | POA: Diagnosis not present

## 2020-05-18 DIAGNOSIS — R944 Abnormal results of kidney function studies: Secondary | ICD-10-CM

## 2020-05-18 DIAGNOSIS — D649 Anemia, unspecified: Secondary | ICD-10-CM | POA: Diagnosis not present

## 2020-05-18 MED ORDER — LOSARTAN POTASSIUM 50 MG PO TABS: 50.0000 mg | ORAL_TABLET | Freq: Every day | ORAL | 3 refills | Status: DC

## 2020-05-18 NOTE — Progress Notes (Signed)
Cologuard ordered for patient.  

## 2020-05-19 ENCOUNTER — Ambulatory Visit (HOSPITAL_BASED_OUTPATIENT_CLINIC_OR_DEPARTMENT_OTHER)
Admission: RE | Admit: 2020-05-19 | Discharge: 2020-05-19 | Disposition: A | Discharge status: Home / Self Care | Payer: Medicare PPO | Source: Ambulatory Visit | Attending: Family Medicine | Admitting: Family Medicine

## 2020-05-19 ENCOUNTER — Encounter: Payer: Self-pay | Admitting: Family Medicine

## 2020-05-19 ENCOUNTER — Ambulatory Visit (INDEPENDENT_AMBULATORY_CARE_PROVIDER_SITE_OTHER): Payer: Medicare PPO | Admitting: Sports Medicine

## 2020-05-19 VITALS — BP 120/84 | Ht 64.0 in | Wt 169.0 lb

## 2020-05-19 DIAGNOSIS — Z1231 Encounter for screening mammogram for malignant neoplasm of breast: Secondary | ICD-10-CM

## 2020-05-19 DIAGNOSIS — M170 Bilateral primary osteoarthritis of knee: Secondary | ICD-10-CM

## 2020-05-19 LAB — COMPREHENSIVE METABOLIC PANEL
ALT: 19 U/L (ref 0–35)
AST: 18 U/L (ref 0–37)
Albumin: 4.5 g/dL (ref 3.5–5.2)
Alkaline Phosphatase: 69 U/L (ref 39–117)
BUN: 27 mg/dL — ABNORMAL HIGH (ref 6–23)
CO2: 26 mEq/L (ref 19–32)
Calcium: 9.7 mg/dL (ref 8.4–10.5)
Chloride: 102 mEq/L (ref 96–112)
Creatinine, Ser: 1.03 mg/dL (ref 0.40–1.20)
GFR: 51.31 mL/min — ABNORMAL LOW (ref >60.00)
Glucose, Bld: 97 mg/dL (ref 70–99)
Potassium: 4.4 mEq/L (ref 3.5–5.1)
Sodium: 136 mEq/L (ref 135–145)
Total Bilirubin: 0.4 mg/dL (ref 0.2–1.2)
Total Protein: 7.2 g/dL (ref 6.0–8.3)

## 2020-05-19 LAB — LDL CHOLESTEROL, DIRECT: Direct LDL: 114 mg/dL

## 2020-05-19 LAB — LIPID PANEL
Cholesterol: 231 mg/dL — ABNORMAL HIGH (ref 0–200)
HDL: 65.5 mg/dL (ref >39.00)
NonHDL: 165.2
Total CHOL/HDL Ratio: 4
Triglycerides: 293 mg/dL — ABNORMAL HIGH (ref 0.0–149.0)
VLDL: 58.6 mg/dL — ABNORMAL HIGH (ref 0.0–40.0)

## 2020-05-19 LAB — CBC
HCT: 37.9 % (ref 36.0–46.0)
Hemoglobin: 13.1 g/dL (ref 12.0–15.0)
MCHC: 34.6 g/dL (ref 30.0–36.0)
MCV: 97.7 fl (ref 78.0–100.0)
Platelets: 200 10*3/uL (ref 150.0–400.0)
RBC: 3.88 Mil/uL (ref 3.87–5.11)
RDW: 12.9 % (ref 11.5–15.5)
WBC: 7.2 10*3/uL (ref 4.0–10.5)

## 2020-05-19 NOTE — Progress Notes (Signed)
    SUBJECTIVE:   CHIEF COMPLAINT / HPI:   Bilateral Knee Pain Patient returns to clinic for follow up visit due to bilateral patellofemoral osteoarthritis seen on previous x-rays. She currently sees Dr. August Saucer at [facility] for serial cortisone injections that she gets about every 3 months. She states her pain is manageable and the shots give her pain-free relief for about 1 1/2 months but by the end of 3 months she is "ready for another shot". She states the Tylenol and Voltaren gel are helping and she is able to stay active. She continues to do gardening, cleaning up the house, doing all her ADLs, and she can golf some but not a full 18 holes. Overall, she feels the current regimen is controlling her pain.  PERTINENT  PMH / PSH: HTN, Bilat knee OA  OBJECTIVE:   BP 120/84   Ht 5\' 4"  (1.626 m)   Wt 169 lb (76.7 kg)   BMI 29.01 kg/m   Gen: NAD MSK: Knee, Right: Inspection was negative for erythema, ecchymosis, mild effusion present with obvious arthritic changes to the knee with signs of osteophyte development. Palpation yielded no asymmetric warmth; Positive for joint line tenderness; No condyle tenderness; Positive patellar tenderness; Positive patellar crepitus. Patellar and quadriceps tendons unremarkable, and no tenderness of the pes anserine bursa. No obvious Baker's cyst development. ROM normal in flexion (135 degrees) and extension (0 degrees). Normal hamstring and quadriceps strength. Neurovascularly intact bilaterally. Knee, Left: Inspection was negative for erythema, ecchymosis, mild effusion present with obvious arthritic changes to the knee with signs of osteophyte development. Palpation yielded no asymmetric warmth; Positive for joint line tenderness; No condyle tenderness; Positive patellar tenderness; Positive patellar crepitus. Patellar and quadriceps tendons unremarkable, and no tenderness of the pes anserine bursa. No obvious Baker's cyst development. ROM normal in  flexion (135 degrees) and extension (0 degrees). Normal hamstring and quadriceps strength. Neurovascularly intact bilaterally. Special Tests  - Patella:   - Patellar grind/compression: Positive bilaterally     ASSESSMENT/PLAN:   Degenerative arthritis of knee, bilateral Symptoms controlled with regularly scheduled cortisone injections every 3 months and use of OTC Tylenol and Voltaren gel. Will try to avoid NSAIDs if possible given age and mild decrease in kidney function. - cont exercises; start isometric quadraceps exercises - Cont Tylenol up to 4 times per day as needed with Voltaren gel up to 4 times per day as needed for pain - Cont with Yoga and wearing braces only when active     , DO [facility]  I observed and examined the patient with Dr. [facility] and agree with assessment and plan.  Note reviewed and modified by me. Karen Chafe, MD

## 2020-05-19 NOTE — Patient Instructions (Signed)
It was great to meet you today! Thank you for letting me participate in your care!  Today, we discussed your knee arthritis and we want you to start doing isometric knee exercises where you flex your quadriceps muscle and hold it for 10 seconds each. Do this 10 times per day for each leg.  You can take Tylenol up to four times per day and use the Voltaren gel up to four times per day as needed for knee pain.  Be well, Jules Schick, DO PGY-3, [facility]

## 2020-05-19 NOTE — Assessment & Plan Note (Signed)
Symptoms controlled with regularly scheduled cortisone injections every 3 months and use of OTC Tylenol and Voltaren gel. Will try to avoid NSAIDs if possible given age and mild decrease in kidney function. - cont exercises; start isometric quadraceps exercises - Cont Tylenol up to 4 times per day as needed with Voltaren gel up to 4 times per day as needed for pain - Cont with Yoga and wearing braces only when active

## 2020-05-20 ENCOUNTER — Ambulatory Visit: Payer: Medicare PPO | Admitting: Orthopedic Surgery

## 2020-05-20 DIAGNOSIS — M170 Bilateral primary osteoarthritis of knee: Secondary | ICD-10-CM

## 2020-05-24 ENCOUNTER — Encounter: Payer: Self-pay | Admitting: Orthopedic Surgery

## 2020-05-24 DIAGNOSIS — M170 Bilateral primary osteoarthritis of knee: Secondary | ICD-10-CM | POA: Diagnosis not present

## 2020-05-24 MED ORDER — METHYLPREDNISOLONE ACETATE 40 MG/ML IJ SUSP
40.0000 mg | INTRAMUSCULAR | Status: AC | PRN
Start: 2020-05-24 — End: 2020-05-24
  Administered 2020-05-24: 40 mg via INTRA_ARTICULAR

## 2020-05-24 MED ORDER — BUPIVACAINE HCL 0.25 % IJ SOLN
4.0000 mL | INTRAMUSCULAR | Status: AC | PRN
  Administered 2020-05-24: 4 mL via INTRA_ARTICULAR

## 2020-05-24 MED ORDER — LIDOCAINE HCL 1 % IJ SOLN
5.0000 mL | INTRAMUSCULAR | Status: AC | PRN
  Administered 2020-05-24: 5 mL

## 2020-05-24 MED ORDER — METHYLPREDNISOLONE ACETATE 40 MG/ML IJ SUSP
40.0000 mg | INTRAMUSCULAR | Status: AC | PRN
  Administered 2020-05-24: 40 mg via INTRA_ARTICULAR

## 2020-05-24 NOTE — Progress Notes (Signed)
Office Visit Note   Patient: Katie Johnson           Date of Birth: 03-May-1936           MRN: [id number] Visit Date: 05/20/2020 Requested by: Pearline Cables, MD [address] [place],  [place] [address] PCP: Pearline Cables, MD  Subjective: Chief Complaint  Patient presents with   Left Knee - Pain   Right Knee - Pain    HPI: Katie Johnson is a 84 y.o. female who presents to the office complaining of bilateral knee pain.  Patient had bilateral knee gel injections on 02/19/2020.  She states that the gel injections gave no improvement.  She states that her right knee bothers her as much as her left knee.  Knee sleeves help with her pain and she is able to play golf.  She localizes pain to the medial and lateral aspects of the knees.  Denies any groin pain.  She likes to golf, gardening, use recumbent bike.  Pain does not wake her up at night.  She does note it gives out on occasion but denies any mechanical symptoms.  She is okay walking on level ground without significant pain.  She takes Tylenol twice a day.  Pain is worst when going from a sitting position to a standing position especially getting up from the toilet..                ROS: All systems reviewed are negative as they relate to the chief complaint within the history of present illness.  Patient denies fevers or chills.  Assessment & Plan: Visit Diagnoses:  1. Primary osteoarthritis of both knees     Plan: Patient is an 84 year old female who presents complaining of bilateral knee pain.  She has a history of bilateral knee osteoarthritis.  She has well-preserved function and is able to do the activities that she wants day-to-day aside from occasional pain that is worse when going from a sitting position to a standing position.  No mechanical symptoms.  Occasionally feels like the knee was want to give out on her.  Joint line tenderness on exam today.  Gel junctions have provided no relief.  Plan for bilateral  knee cortisone injections today.  Patient tolerated the procedure well.  Follow-up as needed.  Follow-Up Instructions: No follow-ups on file.   Orders:  No orders of the defined types were placed in this encounter.  No orders of the defined types were placed in this encounter.     Procedures: Large Joint Inj: bilateral knee on 05/24/2020 9:36 PM Indications: diagnostic evaluation, joint swelling and pain Details: 18 G 1.5 in needle, superolateral approach  Arthrogram: No  Medications (Right): 5 mL lidocaine 1 %; 4 mL bupivacaine 0.25 %; 40 mg methylPREDNISolone acetate 40 MG/ML Medications (Left): 5 mL lidocaine 1 %; 4 mL bupivacaine 0.25 %; 40 mg methylPREDNISolone acetate 40 MG/ML Outcome: tolerated well, no immediate complications Procedure, treatment alternatives, risks and benefits explained, specific risks discussed. Consent was given by the patient. Immediately prior to procedure a time out was called to verify the correct patient, procedure, equipment, support staff and site/side marked as required. Patient was prepped and draped in the usual sterile fashion.       Clinical Data: No additional findings.  Objective: Vital Signs: There were no vitals taken for this visit.  Physical Exam:  Constitutional: Patient appears well-developed HEENT:  Head: Normocephalic Eyes:EOM are normal Neck: Normal range  of motion Cardiovascular: Normal rate Pulmonary/chest: Effort normal Neurologic: Patient is alert Skin: Skin is warm Psychiatric: Patient has normal mood and affect  Ortho Exam: Orthopedic exam demonstrates medial lateral joint line tenderness to palpation in bilateral knees.  She has a trace effusion of the right knee.  No effusion of the left knee.  Extensor mechanism intact bilaterally.  Excellent range of motion with 0 degrees extension and greater than 120 degrees of flexion.  No pain with internal rotation/external rotation of the bilateral hip joints.  No groin  pain elicited on exam.  Negative straight leg raise bilaterally.  Specialty Comments:  No specialty comments available.  Imaging: No results found.   PMFS History: Patient Active Problem List   Diagnosis Date Noted   Greater trochanteric bursitis of left hip 07/23/2019   Acute respiratory failure with hypoxia (HCC) 10/03/2018   CAP (community acquired pneumonia) 10/03/2018   Hyponatremia 10/03/2018   CKD (chronic kidney disease) stage 3, GFR 30-59 ml/min 10/03/2018   Generalized weakness 10/03/2018   Hypophosphatemia 10/03/2018   Abnormal LFTs 10/03/2018   Macrocytic anemia 10/03/2018   Sepsis (HCC) 09/30/2018   AC (acromioclavicular) arthritis 12/21/2017   Essential hypertension 11/28/2017   Degenerative arthritis of knee, bilateral 07/15/2015   De Quervain's disease (tenosynovitis) 09/15/2011   METATARSALGIA 07/14/2010   BUNIONS, BILATERAL 07/14/2010   FOOT PAIN, BILATERAL 07/14/2010   BACK PAIN, THORACIC REGION 03/30/2010   Past Medical History:  Diagnosis Date   Arthritis    Chicken pox    GERD (gastroesophageal reflux disease)    High blood pressure    Hypertension     Family History  Problem Relation Age of Onset   COPD Mother    Heart disease Mother    Diabetes Father    Heart disease Father    Colon cancer Neg Hx    Esophageal cancer Neg Hx     Past Surgical History:  Procedure Laterality Date   COLONOSCOPY     Dr Jeani Hawking before 2010 per patient    ESOPHAGOGASTRODUODENOSCOPY     over 20 years ago   Social History   Occupational History   Occupation: Agricultural consultant at [employer]  Tobacco Use   Smoking status: Never Smoker   Smokeless tobacco: Never Used  Building services engineer Use: Never used  Substance and Sexual Activity   Alcohol use: Not Currently   Drug use: Never   Sexual activity: Not on file

## 2020-05-25 DIAGNOSIS — Z1211 Encounter for screening for malignant neoplasm of colon: Secondary | ICD-10-CM | POA: Diagnosis not present

## 2020-05-25 LAB — COLOGUARD: Cologuard: NEGATIVE

## 2020-06-01 ENCOUNTER — Encounter: Payer: Self-pay | Admitting: Family Medicine

## 2020-06-08 MED FILL — LOSARTAN POTASSIUM 50 MG TA: 50 | 90 days supply | Qty: 90 | Fill #0

## 2020-06-15 DIAGNOSIS — H2512 Age-related nuclear cataract, left eye: Secondary | ICD-10-CM | POA: Diagnosis not present

## 2020-06-15 DIAGNOSIS — H25042 Posterior subcapsular polar age-related cataract, left eye: Secondary | ICD-10-CM | POA: Diagnosis not present

## 2020-06-15 DIAGNOSIS — H25012 Cortical age-related cataract, left eye: Secondary | ICD-10-CM | POA: Diagnosis not present

## 2020-07-06 ENCOUNTER — Telehealth: Payer: Self-pay | Admitting: Family Medicine

## 2020-07-06 DIAGNOSIS — L989 Disorder of the skin and subcutaneous tissue, unspecified: Secondary | ICD-10-CM

## 2020-07-06 MED FILL — AMLODIPINE BESYLATE 5 MG TA: 5 | 30 days supply | Qty: 30 | Fill #1

## 2020-07-06 NOTE — Telephone Encounter (Signed)
Caller : [employer]  Call Back # [phone]   Patient states she in need of a referral to a Dermatologist, Patient went to have cataract removed, per surgeon he will not remove the second cataract until patient sees a dermatologist for the grown on her nose.    Patient believes that [facility] could get her in soon.

## 2020-07-06 NOTE — Telephone Encounter (Signed)
Referral placed for patient.  

## 2020-07-10 DIAGNOSIS — D485 Neoplasm of uncertain behavior of skin: Secondary | ICD-10-CM | POA: Diagnosis not present

## 2020-07-10 DIAGNOSIS — C44311 Basal cell carcinoma of skin of nose: Secondary | ICD-10-CM | POA: Diagnosis not present

## 2020-07-10 DIAGNOSIS — L821 Other seborrheic keratosis: Secondary | ICD-10-CM | POA: Diagnosis not present

## 2020-07-10 DIAGNOSIS — Z85068 Personal history of other malignant neoplasm of small intestine: Secondary | ICD-10-CM | POA: Diagnosis not present

## 2020-07-10 DIAGNOSIS — D229 Melanocytic nevi, unspecified: Secondary | ICD-10-CM | POA: Diagnosis not present

## 2020-07-10 DIAGNOSIS — D1801 Hemangioma of skin and subcutaneous tissue: Secondary | ICD-10-CM | POA: Diagnosis not present

## 2020-07-16 ENCOUNTER — Telehealth: Payer: Medicare PPO | Admitting: Family Medicine

## 2020-07-17 DIAGNOSIS — D485 Neoplasm of uncertain behavior of skin: Secondary | ICD-10-CM | POA: Diagnosis not present

## 2020-07-27 NOTE — Progress Notes (Signed)
[facility] [address] [place], [place] [address] [phone]  Date:  07/29/2020   Name:  Katie Johnson   DOB:  Dec 16, 1935   MRN:  [id number]  PCP:  Pearline Cables, MD    Chief Complaint: Follow-up (nose biopsy, never given results)   History of Present Illness:  Katie Johnson is a 84 y.o. very pleasant female patient who presents with the following:  Patient here today to follow-up on recent biopsy of her nasal bridge Last seen by myself in July-at that time she was doing well, blood pressure looked good History of hypertension, chronic kidney disease She went to have her left cataract removed, but she had developed a growth on the bridge of her nose (first noted in June/ July) which needed to be removed before she could get her cataract done  She lives at [facility] and they have a mobile dermatology service that comes out.  She had a biopsy done 9/3- told to return on 9/10- the bx report was not back yet.  She has another follow-up appt later this month but was getting concerned   The area seems to be healing well Not painful She is wearing a band- aid to protect from her glasses   Flu vaccine- give today  Mammo up-to-date Code vaccine up-to-date, suggested  booster  Patient Active Problem List   Diagnosis Date Noted  . Greater trochanteric bursitis of left hip 07/23/2019  . Acute respiratory failure with hypoxia (HCC) 10/03/2018  . CAP (community acquired pneumonia) 10/03/2018  . Hyponatremia 10/03/2018  . CKD (chronic kidney disease) stage 3, GFR 30-59 ml/min 10/03/2018  . Generalized weakness 10/03/2018  . Hypophosphatemia 10/03/2018  . Abnormal LFTs 10/03/2018  . Macrocytic anemia 10/03/2018  . Sepsis (HCC) 09/30/2018  . AC (acromioclavicular) arthritis 12/21/2017  . Essential hypertension 11/28/2017  . Degenerative arthritis of knee, bilateral 07/15/2015  . De Quervain's disease (tenosynovitis)  09/15/2011  . METATARSALGIA 07/14/2010  . BUNIONS, BILATERAL 07/14/2010  . FOOT PAIN, BILATERAL 07/14/2010  . BACK PAIN, THORACIC REGION 03/30/2010    Past Medical History:  Diagnosis Date  . Arthritis   . Chicken pox   . GERD (gastroesophageal reflux disease)   . High blood pressure   . Hypertension     Past Surgical History:  Procedure Laterality Date  . COLONOSCOPY     Dr Jeani Hawking before 2010 per patient   . ESOPHAGOGASTRODUODENOSCOPY     over 20 years ago    Social History   Tobacco Use  . Smoking status: Never Smoker  . Smokeless tobacco: Never Used  Vaping Use  . Vaping Use: Never used  Substance Use Topics  . Alcohol use: Not Currently  . Drug use: Never    Family History  Problem Relation Age of Onset  . COPD Mother   . Heart disease Mother   . Diabetes Father   . Heart disease Father   . Colon cancer Neg Hx   . Esophageal cancer Neg Hx     Allergies  Allergen Reactions  . Shellfish-Derived Products Anaphylaxis  . Statins Diarrhea and Nausea And Vomiting  . Amlodipine Swelling    Foot swelling only   . Benazepril     High Blood Pressure 209/93  . Pravastatin Diarrhea  . Ketoprofen Rash    Medication list has been reviewed and updated.  Current Outpatient Medications on File Prior to Visit  Medication Sig  Dispense Refill  . amLODipine (NORVASC) 5 MG tablet Take 1 tablet (5 mg total) by mouth daily. 90 tablet 1  . cholecalciferol (VITAMIN D) 1000 units tablet Take 1,000 Units by mouth daily.     . diclofenac Sodium (VOLTAREN) 1 % GEL Apply 4 g topically 2 (two) times daily. 700 g 1  . losartan (COZAAR) 50 MG tablet Take 1 tablet (50 mg total) by mouth daily. 90 tablet 3  . Multiple Vitamins-Minerals (CENTRUM ADULTS PO) Take 1 tablet by mouth daily.      No current facility-administered medications on file prior to visit.    Review of Systems:  As per HPI- otherwise negative.   Physical Examination: Vitals:   07/29/20 1436  BP:  124/74  Pulse: 70  Resp: 15  SpO2: 98%   Vitals:   07/29/20 1436  Weight: 170 lb (77.1 kg)  Height: 5\' 4"  (1.626 m)   Body mass index is 29.18 kg/m. Ideal Body Weight: Weight in (lb) to have BMI = 25: 145.3  GEN: no acute distress.  Well appearing older lady HEENT: Atraumatic, Normocephalic.  Ears and Nose: No external deformity. CV: RRR, No M/G/R. No JVD. No thrill. No extra heart sounds. PULM: CTA B, no wheezes, crackles, rhonchi. No retractions. No resp. distress. No accessory muscle use. EXTR: No c/c/e PSYCH: Normally interactive. Conversant.  Small healing bx site on left nasal bridge.  Looks to be healing normally  Called the number for her mobile dermatology service.  Path report is available and is being faxed.  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  Her husband sees Dr and Doreen Beam would like to see him as well- made referral to his dermatology practice.  She will let me know if any other concerns in the meantime Flu shot given today  This visit occurred during the SARS-CoV-2 public health emergency.  Safety protocols were in place, including screening questions prior to the visit, additional usage of staff PPE, and extensive cleaning of exam room while observing appropriate contact time as indicated for disinfecting solutions.    Signed Dewayne Hatch, MD

## 2020-07-29 ENCOUNTER — Encounter: Payer: Self-pay | Admitting: Family Medicine

## 2020-07-29 ENCOUNTER — Other Ambulatory Visit: Payer: Self-pay

## 2020-07-29 ENCOUNTER — Ambulatory Visit: Payer: Medicare PPO | Admitting: Family Medicine

## 2020-07-29 VITALS — BP 124/74 | HR 70 | Resp 15 | Ht 64.0 in | Wt 170.0 lb

## 2020-07-29 DIAGNOSIS — Z23 Encounter for immunization: Secondary | ICD-10-CM

## 2020-07-29 DIAGNOSIS — D049 Carcinoma in situ of skin, unspecified: Secondary | ICD-10-CM | POA: Diagnosis not present

## 2020-07-29 NOTE — Patient Instructions (Signed)
It was good to see you today!  Flu shot given Plan for covid 19 booster about 6 months after your 2nd shot  Your biopsy did show skin cancer- NOT melanoma. The cancer extended to one of the margins of the biopsy.  I am going to set you up with dermatology to get this treated,  But I don't think this should delay your cataract surgery at all

## 2020-07-30 MED FILL — AMLODIPINE BESYLATE 5 MG TA: 5 | 30 days supply | Qty: 30 | Fill #2

## 2020-08-05 ENCOUNTER — Telehealth: Payer: Self-pay | Admitting: Family Medicine

## 2020-08-05 NOTE — Telephone Encounter (Signed)
[facility]) Phone number: [phone] Fax number: [phone]  Katie states they need the pathology report to be faxed. Patient appointment is on 08/06/20

## 2020-08-06 DIAGNOSIS — C44612 Basal cell carcinoma of skin of right upper limb, including shoulder: Secondary | ICD-10-CM | POA: Diagnosis not present

## 2020-08-06 DIAGNOSIS — Z85828 Personal history of other malignant neoplasm of skin: Secondary | ICD-10-CM | POA: Diagnosis not present

## 2020-08-06 DIAGNOSIS — D1801 Hemangioma of skin and subcutaneous tissue: Secondary | ICD-10-CM | POA: Diagnosis not present

## 2020-08-06 DIAGNOSIS — L821 Other seborrheic keratosis: Secondary | ICD-10-CM | POA: Diagnosis not present

## 2020-08-06 DIAGNOSIS — D225 Melanocytic nevi of trunk: Secondary | ICD-10-CM | POA: Diagnosis not present

## 2020-08-06 DIAGNOSIS — C441192 Basal cell carcinoma of skin of left lower eyelid, including canthus: Secondary | ICD-10-CM | POA: Diagnosis not present

## 2020-08-06 NOTE — Telephone Encounter (Signed)
Patient returned Bailey's call.  Per patient, she never received a copy of the dermatology path report.  She has tried to call the [phone] (number to the dermatology treatment center) that sent the provider, Shawnee Knapp, PA-C, to [facility] where the patient was treated and lives.  She was not able to reach anyone at that number.  Sh stated there is also a fax # [phone].  Patient is going to try calling that phone number again to see if she can reach anyone to see if they can send the path report to [place], Attn: Dr. Patsy Lager.  I provided patient with our fax number to give to them in case she does reach someone.  If we do receive a copy of the path report, please notify the patient.

## 2020-08-06 NOTE — Telephone Encounter (Signed)
Pathology report was sent to scan but it not in chart yet. I called patient to see if she had another copy. She did not answer, left detailed message to call back.

## 2020-08-06 NOTE — Telephone Encounter (Signed)
Hey can you help me with this one too? Please Thanks

## 2020-08-10 NOTE — Telephone Encounter (Signed)
[facility] dermatology Phone number: [phone] Fax number: [phone]  They are still waiting for the pathology report

## 2020-08-10 NOTE — Telephone Encounter (Signed)
Left message at office stating we do not have report. Patient states she was never given report.

## 2020-08-18 ENCOUNTER — Other Ambulatory Visit: Payer: Self-pay

## 2020-08-18 ENCOUNTER — Ambulatory Visit: Payer: Medicare PPO | Admitting: Sports Medicine

## 2020-08-18 VITALS — BP 171/76 | Ht 64.0 in | Wt 164.0 lb

## 2020-08-18 DIAGNOSIS — M1711 Unilateral primary osteoarthritis, right knee: Secondary | ICD-10-CM | POA: Diagnosis not present

## 2020-08-18 DIAGNOSIS — M170 Bilateral primary osteoarthritis of knee: Secondary | ICD-10-CM | POA: Diagnosis not present

## 2020-08-18 DIAGNOSIS — M1712 Unilateral primary osteoarthritis, left knee: Secondary | ICD-10-CM | POA: Diagnosis not present

## 2020-08-18 NOTE — Progress Notes (Signed)
   Office Visit Note   Patient: Katie Johnson           Date of Birth: 05-27-1936           MRN: [id number] Visit Date: 08/18/2020 Requested by: Pearline Cables, MD [address] [place],  [place] [address] PCP: Pearline Cables, MD  Subjective: CC: Follow up on Bilateral Knee Pain  HPI: 84 year old female presenting to clinic today to follow-up on bilateral knee arthritis.  Patient states that she has been compliant with the exercises that she was previously described, and that this is offered profound improvement to her symptoms.  She says that she is on the recumbent bike daily, and her knee pain does not bother her at all during this activity.  She also tries gentle yoga, and has been very happy with how she feels after these activities.  Currently, she states her only concern is that she continues to have some pain with going up and down stairs-however she does not have stairs where she lives.  She says that her goal now is to get to a point where she can easily walk up and down stairs without pain. Overall, patient is very happy with the progress that she has made.              ROS:   All other systems were reviewed and are negative.  Objective: Vital Signs: BP (!) 171/76   Ht 5\' 4"  (1.626 m)   Wt 164 lb (74.4 kg)   BMI 28.15 kg/m   Physical Exam:  General:  Alert and oriented, in no acute distress. Pulm:  Breathing unlabored. Psy:  Normal mood, congruent affect. Skin: Bilateral legs with trace pitting edema appreciated to the mid calf.  Overlying skin intact.  No bruising, no rashes. Knee exam: Normal gait.  No varus or valgus deformity of the knee. Mild tenderness to palpation over medial joint line bilaterally.  Knee range of motion:  Bilateral knees with full extension, and flexion to approximately 100 degrees.  No laxity or pain with Lachman, or varus or valgus stress across the knee. McMurray's without pain or deep clicking.  Imaging: None  today.  Assessment & Plan: 84 year old female presented to clinic to follow-up on bilateral knee arthritis.  Patient has been compliant with previously prescribed exercises, and has made great progress thus far. -Encouraged to continue the exercises, with the addition of regular walking 3-4 times weekly.  -Continue low-dose Tylenol as needed for pain control.  -If patient continues to do very well, no further follow-up is needed.  However, if her pain fails to improve to an adequate degree, she is encouraged to return to clinic in approximately 3 months for reevaluation. -Patient has no further concerns today, and agrees with assessment and plan.     Procedures: No procedures performed  No notes on file

## 2020-09-01 MED FILL — LOSARTAN POTASSIUM 50 MG TA: 50 | 90 days supply | Qty: 90 | Fill #1

## 2020-09-01 MED FILL — AMLODIPINE BESYLATE 5 MG TA: 5 | 30 days supply | Qty: 30 | Fill #3

## 2020-09-04 ENCOUNTER — Ambulatory Visit: Payer: Medicare PPO | Attending: Internal Medicine

## 2020-09-04 ENCOUNTER — Other Ambulatory Visit (HOSPITAL_BASED_OUTPATIENT_CLINIC_OR_DEPARTMENT_OTHER): Payer: Self-pay | Admitting: Internal Medicine

## 2020-09-04 DIAGNOSIS — Z23 Encounter for immunization: Secondary | ICD-10-CM

## 2020-09-07 NOTE — Progress Notes (Signed)
   Covid-19 Vaccination Clinic  Name:  Katie Johnson    MRN: [id number] DOB: Feb 25, 1936  09/07/2020  Katie Johnson was observed post Covid-19 immunization for 15 minutes without incident. She was provided with Vaccine Information Sheet and instruction to access the V-Safe system.   Katie Johnson was instructed to call 911 with any severe reactions post vaccine: Marland Kitchen Difficulty breathing  . Swelling of face and throat  . A fast heartbeat  . A bad rash all over body  . Dizziness and weakness

## 2020-09-10 ENCOUNTER — Other Ambulatory Visit (HOSPITAL_BASED_OUTPATIENT_CLINIC_OR_DEPARTMENT_OTHER): Payer: Self-pay | Admitting: Dermatology

## 2020-09-10 DIAGNOSIS — C44311 Basal cell carcinoma of skin of nose: Secondary | ICD-10-CM | POA: Diagnosis not present

## 2020-09-10 DIAGNOSIS — Z85828 Personal history of other malignant neoplasm of skin: Secondary | ICD-10-CM | POA: Diagnosis not present

## 2020-09-10 MED FILL — DOXYCYCLINE HYCLATE 100 MG: 100 | 5 days supply | Qty: 10 | Fill #0

## 2020-09-10 MED FILL — MODERNA COVID-19 VACCINE 10: 100 | 1 days supply | Qty: 0 | Fill #0

## 2020-09-17 DIAGNOSIS — L821 Other seborrheic keratosis: Secondary | ICD-10-CM | POA: Diagnosis not present

## 2020-09-17 DIAGNOSIS — D485 Neoplasm of uncertain behavior of skin: Secondary | ICD-10-CM | POA: Diagnosis not present

## 2020-09-21 ENCOUNTER — Ambulatory Visit: Payer: Medicare PPO | Admitting: Orthopedic Surgery

## 2020-09-21 DIAGNOSIS — M170 Bilateral primary osteoarthritis of knee: Secondary | ICD-10-CM | POA: Diagnosis not present

## 2020-09-26 ENCOUNTER — Encounter: Payer: Self-pay | Admitting: Orthopedic Surgery

## 2020-09-26 NOTE — Progress Notes (Signed)
Office Visit Note   Patient: Katie Johnson           Date of Birth: [birth year]           MRN: [id number] Visit Date: 09/21/2020 Requested by: Katie Cables, MD [address] [place],  [place] [address] PCP: Katie Cables, MD  Subjective: Chief Complaint  Patient presents with  . Left Knee - Pain  . Right Knee - Pain    HPI: Katie Johnson is an 84 year old patient with bilateral knee pain.  Has known history of bilateral knee arthritis.  Does reasonably well with episodic cortisone injections.  She would like to have those injections performed today.  Denies any interval hospitalization or injury.  Denies much in the way of mechanical symptoms to the knee but reports primarily pain.              ROS: All systems reviewed are negative as they relate to the chief complaint within the history of present illness.  Patient denies  fevers or chills.   Assessment & Plan: Visit Diagnoses:  1. Primary osteoarthritis of both knees     Plan: Impression is bilateral knee arthritis.  Plan is bilateral cortisone injections.  Right knee is also aspirated.  Clinical recheck in 4 months for repeat evaluation.  Follow-Up Instructions: Return in about 4 months (around 01/19/2021).   Orders:  No orders of the defined types were placed in this encounter.  No orders of the defined types were placed in this encounter.     Procedures: No procedures performed   Clinical Data: No additional findings.  Objective: Vital Signs: There were no vitals taken for this visit.  Physical Exam:   Constitutional: Patient appears well-developed HEENT:  Head: Normocephalic Eyes:EOM are normal Neck: Normal range of motion Cardiovascular: Normal rate Pulmonary/chest: Effort normal Neurologic: Patient is alert Skin: Skin is warm Psychiatric: Patient has normal mood and affect    Ortho Exam: Ortho exam demonstrates full active and passive range of motion of the hips and ankles.  Pedal  pulses palpable.  Both knees have global periretinacular tenderness with mild patellofemoral crepitus.  Extensor mechanism intact bilaterally.  Lacks full extension bilaterally but is able to bend past 90.  Muscle tone symmetric without atrophy in both legs.  Specialty Comments:  No specialty comments available.  Imaging: No results found.   PMFS History: Patient Active Problem List   Diagnosis Date Noted  . Greater trochanteric bursitis of left hip 07/23/2019  . Acute respiratory failure with hypoxia (HCC) 10/03/2018  . CAP (community acquired pneumonia) 10/03/2018  . Hyponatremia 10/03/2018  . CKD (chronic kidney disease) stage 3, GFR 30-59 ml/min (HCC) 10/03/2018  . Generalized weakness 10/03/2018  . Hypophosphatemia 10/03/2018  . Abnormal LFTs 10/03/2018  . Macrocytic anemia 10/03/2018  . Sepsis (HCC) 09/30/2018  . AC (acromioclavicular) arthritis 12/21/2017  . Essential hypertension 11/28/2017  . Degenerative arthritis of knee, bilateral 07/15/2015  . De Quervain's disease (tenosynovitis) 09/15/2011  . METATARSALGIA 07/14/2010  . BUNIONS, BILATERAL 07/14/2010  . FOOT PAIN, BILATERAL 07/14/2010  . BACK PAIN, THORACIC REGION 03/30/2010   Past Medical History:  Diagnosis Date  . Arthritis   . Chicken pox   . GERD (gastroesophageal reflux disease)   . High blood pressure   . Hypertension     Family History  Problem Relation Age of Onset  . COPD Mother   . Heart disease Mother   . Diabetes Father   . Heart disease  Father   . Colon cancer Neg Hx   . Esophageal cancer Neg Hx     Past Surgical History:  Procedure Laterality Date  . COLONOSCOPY     Dr Katie Johnson before 2010 per patient   . ESOPHAGOGASTRODUODENOSCOPY     over 20 years ago   Social History   Occupational History  . Occupation: Agricultural consultant at [employer]  . Smoking status: Never Smoker  . Smokeless tobacco: Never Used  Vaping Use  . Vaping Use: Never used  Substance and Sexual Activity    . Alcohol use: Not Currently  . Drug use: Never  . Sexual activity: Not on file

## 2020-09-29 DIAGNOSIS — H2512 Age-related nuclear cataract, left eye: Secondary | ICD-10-CM | POA: Diagnosis not present

## 2020-09-29 DIAGNOSIS — H25042 Posterior subcapsular polar age-related cataract, left eye: Secondary | ICD-10-CM | POA: Diagnosis not present

## 2020-09-29 DIAGNOSIS — H25012 Cortical age-related cataract, left eye: Secondary | ICD-10-CM | POA: Diagnosis not present

## 2020-09-29 DIAGNOSIS — H25812 Combined forms of age-related cataract, left eye: Secondary | ICD-10-CM | POA: Diagnosis not present

## 2020-10-09 ENCOUNTER — Other Ambulatory Visit: Payer: Self-pay | Admitting: Family Medicine

## 2020-10-09 DIAGNOSIS — I10 Essential (primary) hypertension: Secondary | ICD-10-CM

## 2020-10-09 MED FILL — AMLODIPINE BESYLATE 5 MG TA: 5 | 30 days supply | Qty: 30 | Fill #0

## 2020-10-09 NOTE — Telephone Encounter (Signed)
Medication: amLODipine (NORVASC) 5 MG table  Has the patient contacted their pharmacy? No. (If no, request that the patient contact the pharmacy for the refill.) (If yes, when and what did the pharmacy advise?)  Preferred Pharmacy (with phone number or street name):  [facility] - [place], [place] - [address] Phone:  [phone]  Fax:  [phone]      Agent: Please be advised that RX refills may take up to 3 business days. We ask that you follow-up with your pharmacy.

## 2020-11-06 MED FILL — AMLODIPINE BESYLATE 5 MG TA: 5 | 30 days supply | Qty: 30 | Fill #1

## 2020-11-23 ENCOUNTER — Encounter: Payer: Self-pay | Admitting: Family Medicine

## 2020-11-23 ENCOUNTER — Other Ambulatory Visit: Payer: Self-pay | Admitting: Family Medicine

## 2020-11-23 DIAGNOSIS — I10 Essential (primary) hypertension: Secondary | ICD-10-CM

## 2020-11-23 MED ORDER — AMLODIPINE BESYLATE 5 MG PO TABS: 5.0000 mg | ORAL_TABLET | Freq: Every day | ORAL | 3 refills | Status: DC

## 2020-12-01 MED FILL — AMLODIPINE BESYLATE 5 MG TA: 5 | 90 days supply | Qty: 90 | Fill #0

## 2020-12-03 MED FILL — LOSARTAN POTASSIUM 50 MG TA: 50 | 90 days supply | Qty: 90 | Fill #2

## 2020-12-15 ENCOUNTER — Ambulatory Visit: Payer: Medicare PPO | Admitting: Sports Medicine

## 2020-12-15 ENCOUNTER — Other Ambulatory Visit: Payer: Self-pay

## 2020-12-15 DIAGNOSIS — G8929 Other chronic pain: Secondary | ICD-10-CM | POA: Diagnosis not present

## 2020-12-15 DIAGNOSIS — M25561 Pain in right knee: Secondary | ICD-10-CM | POA: Diagnosis not present

## 2020-12-15 NOTE — Assessment & Plan Note (Signed)
After discussion with patient and using shared decision making she will not pursue TKA at this time and I think this is a good decision. Her pain is not limiting her in any activity and responds well to medications and injections. Injections are needed maybe every 4-6 months. No instability at this time. - Cont current measures such as Tylenol, Voltaren Gel, injections as needed. - Recumbent bike x5 per week - Follow up in 3 months

## 2020-12-15 NOTE — Patient Instructions (Signed)
declined

## 2020-12-15 NOTE — Progress Notes (Signed)
    SUBJECTIVE:   CHIEF COMPLAINT / HPI:   Right Knee Pain Katie Johnson is a very pleasant 85y/o female who presents today to discuss her long standing right knee pain. She is seeking advice and guidance on if she should pursue surgical options or continue with her current regimen in dealing with her pain. She denies any instability and her pain still responds to conservative measures such as topical medications, oral medications, and injections. She states she does not feel like she is going to fall and the pain does not limit her in any of her ADLs or hobbies.  Pain level now is described as minimal most of the time - 1 to 2/10.  PERTINENT  PMH / PSH: Hx of metatarsalgia, CKD stage III  OBJECTIVE:   BP (!) 174/83   Ht 5\' 4"  (1.626 m)   Wt 164 lb (74.4 kg)   BMI 28.15 kg/m    [facility] 08/18/2020  Frequency of aerobic exercise (# of days/week) 7  Average time in minutes 30  Frequency of strengthening activities (# of days/week) 7    Knee, Right: Inspection was negative for erythema, ecchymosis, and positive for mild effusion. Moderate obvious bony abnormalities consistent with osteophyte development. Palpation yielded no asymmetric warmth; Mild medial and lateral joint line tenderness; Positive for patellar crepitus. ROM normal in flexion (135 degrees) and extension (0 degrees). Normal hamstring and quadriceps strength. Neurovascularly intact bilaterally.   ASSESSMENT/PLAN:   Right knee pain After discussion with patient and using shared decision making she will not pursue TKA at this time and I think this is a good decision. Her pain is not limiting her in any activity and responds well to medications and injections. Injections are needed maybe every 4-6 months. No instability at this time. - Cont current measures such as Tylenol, Voltaren Gel, injections as needed. - Recumbent bike x5 per week - Follow up in 3 months     10/18/2020, DO PGY-4,  Sports Medicine Fellow [facility]  I observed and examined the patient with the [facility] resident and agree with assessment and plan.  Note reviewed and modified by me.  I also advised that the patient does not need the routine CSI if her pain level is low.  [facility], MD

## 2021-01-18 ENCOUNTER — Ambulatory Visit: Payer: Medicare PPO | Admitting: Orthopedic Surgery

## 2021-03-09 ENCOUNTER — Other Ambulatory Visit (HOSPITAL_BASED_OUTPATIENT_CLINIC_OR_DEPARTMENT_OTHER): Payer: Self-pay

## 2021-03-09 MED FILL — Losartan Potassium Tab 50 MG: ORAL | 90 days supply | Qty: 90 | Fill #0 | Status: AC

## 2021-03-09 MED FILL — Amlodipine Besylate Tab 5 MG (Base Equivalent): ORAL | 90 days supply | Qty: 90 | Fill #0 | Status: AC

## 2021-04-26 ENCOUNTER — Ambulatory Visit: Payer: Medicare PPO | Admitting: Orthopedic Surgery

## 2021-04-28 ENCOUNTER — Ambulatory Visit (INDEPENDENT_AMBULATORY_CARE_PROVIDER_SITE_OTHER): Payer: Medicare PPO | Admitting: Orthopedic Surgery

## 2021-04-28 ENCOUNTER — Encounter: Payer: Self-pay | Admitting: Orthopedic Surgery

## 2021-04-28 ENCOUNTER — Ambulatory Visit (INDEPENDENT_AMBULATORY_CARE_PROVIDER_SITE_OTHER): Payer: Medicare PPO

## 2021-04-28 DIAGNOSIS — M25562 Pain in left knee: Secondary | ICD-10-CM

## 2021-04-28 NOTE — Progress Notes (Signed)
Office Visit Note   Patient: Katie Johnson           Date of Birth: [birth year]           MRN: [id number] Visit Date: 04/28/2021 Requested by: Pearline Cables, MD [address] [place],  [place] [address] PCP: Pearline Cables, MD  Subjective: Chief Complaint  Patient presents with   Left Knee - Pain    HPI: Jan is a 85 year old patient with bilateral knee pain left worse than right.  She would like to have the left knee evaluated.  Reports difficulty with stairs and some weakness.  Some exercises have helped her.  Takes over-the-counter medication for pain              ROS: All systems reviewed are negative as they relate to the chief complaint within the history of present illness.  Patient denies  fevers or chills.   Assessment & Plan: Visit Diagnoses:  1. Left knee pain, unspecified chronicity     Plan: Impression is bilateral knee primarily patellofemoral arthritis.  No real joint space narrowing on plain radiographs today particularly compared to radiographs from a year ago.  I would favor gel injection for her at this time.  I do not think her symptoms really warrant the level of intervention and risk that would accompany knee replacement for her.  Follow-up in 4 weeks for bilateral knee gel injections.  Follow-Up Instructions: Return in about 4 weeks (around 05/26/2021).   Orders:  Orders Placed This Encounter  Procedures   XR Knee 1-2 Views Left   No orders of the defined types were placed in this encounter.     Procedures: No procedures performed   Clinical Data: No additional findings.  Objective: Vital Signs: There were no vitals taken for this visit.  Physical Exam:   Constitutional: Patient appears well-developed HEENT:  Head: Normocephalic Eyes:EOM are normal Neck: Normal range of motion Cardiovascular: Normal rate Pulmonary/chest: Effort normal Neurologic: Patient is alert Skin: Skin is warm Psychiatric: Patient has normal  mood and affect   Ortho Exam: Ortho exam demonstrates patellofemoral crepitus with no effusion stable collateral cruciate ligaments.  Good range of motion 0-1 20 bilaterally.  No groin pain with internal or external Tatian of the leg.  Ankle dorsiflexion intact.  Pedal pulses palpable.  Specialty Comments:  No specialty comments available.  Imaging: XR Knee 1-2 Views Left  Result Date: 04/28/2021 AP lateral radiographs left knee reviewed.  Patellofemoral arthritis is present.  Joint spaces maintained in the medial lateral compartment.    PMFS History: Patient Active Problem List   Diagnosis Date Noted   Right knee pain 12/15/2020   Greater trochanteric bursitis of left hip 07/23/2019   Acute respiratory failure with hypoxia (HCC) 10/03/2018   CAP (community acquired pneumonia) 10/03/2018   Hyponatremia 10/03/2018   CKD (chronic kidney disease) stage 3, GFR 30-59 ml/min (HCC) 10/03/2018   Generalized weakness 10/03/2018   Hypophosphatemia 10/03/2018   Abnormal LFTs 10/03/2018   Macrocytic anemia 10/03/2018   Sepsis (HCC) 09/30/2018   AC (acromioclavicular) arthritis 12/21/2017   Essential hypertension 11/28/2017   Degenerative arthritis of knee, bilateral 07/15/2015   De Quervain's disease (tenosynovitis) 09/15/2011   METATARSALGIA 07/14/2010   BUNIONS, BILATERAL 07/14/2010   FOOT PAIN, BILATERAL 07/14/2010   BACK PAIN, THORACIC REGION 03/30/2010   Past Medical History:  Diagnosis Date   Arthritis    Chicken pox    GERD (gastroesophageal reflux disease)  High blood pressure    Hypertension     Family History  Problem Relation Age of Onset   COPD Mother    Heart disease Mother    Diabetes Father    Heart disease Father    Colon cancer Neg Hx    Esophageal cancer Neg Hx     Past Surgical History:  Procedure Laterality Date   COLONOSCOPY     Dr Jeani Hawking before 2010 per patient    ESOPHAGOGASTRODUODENOSCOPY     over 20 years ago   Social History    Occupational History   Occupation: Agricultural consultant at [employer]  Tobacco Use   Smoking status: Never   Smokeless tobacco: Never  Vaping Use   Vaping Use: Never used  Substance and Sexual Activity   Alcohol use: Not Currently   Drug use: Never   Sexual activity: Not on file

## 2021-05-15 NOTE — Progress Notes (Addendum)
[facility] [address] [place], [place] [address] [phone]  Date:  05/20/2021   Name:  Katie Johnson   DOB:  02-08-1936   MRN:  [id number]  PCP:  Pearline Cables, MD    Chief Complaint: Annual Exam (Concerns/ questions: none)   History of Present Illness:  Katie Johnson is a 85 y.o. very pleasant female patient who presents with the following:  Pt seen today for a CPE Last visit with myself last September- history of hypertension, chronic kidney disease  She lives at [facility] - she enjoys living there and is in some social groups  Last year she was treated for a skin cancer on her nose   Covid booster- she plans to do a bit later this year Bone density can be updated- she declines today  Mammo one year ago- will order for her  Labs one year ago  Cologuard negative 2021  She uses voltaren gel for he left sided knee OA- she notes that this keeps her going and controls her sx adequately combined with tylenol BID Pt confirms no sx of allergy with voltaren gel   Her blood pressure is well controlled with current medications  No chest pain or shortness of breath  Her mood is good, overall she is happy with how things are going  Patient Active Problem List   Diagnosis Date Noted   Right knee pain 12/15/2020   Greater trochanteric bursitis of left hip 07/23/2019   Acute respiratory failure with hypoxia (HCC) 10/03/2018   CAP (community acquired pneumonia) 10/03/2018   Hyponatremia 10/03/2018   CKD (chronic kidney disease) stage 3, GFR 30-59 ml/min (HCC) 10/03/2018   Generalized weakness 10/03/2018   Hypophosphatemia 10/03/2018   Abnormal LFTs 10/03/2018   Macrocytic anemia 10/03/2018   Sepsis (HCC) 09/30/2018   AC (acromioclavicular) arthritis 12/21/2017   Essential hypertension 11/28/2017   Degenerative arthritis of knee, bilateral 07/15/2015   De Quervain's disease (tenosynovitis) 09/15/2011    METATARSALGIA 07/14/2010   BUNIONS, BILATERAL 07/14/2010   FOOT PAIN, BILATERAL 07/14/2010   BACK PAIN, THORACIC REGION 03/30/2010    Past Medical History:  Diagnosis Date   Arthritis    Chicken pox    GERD (gastroesophageal reflux disease)    High blood pressure    Hypertension     Past Surgical History:  Procedure Laterality Date   COLONOSCOPY     Dr Jeani Hawking before 2010 per patient    ESOPHAGOGASTRODUODENOSCOPY     over 20 years ago    Social History   Tobacco Use   Smoking status: Never   Smokeless tobacco: Never  Vaping Use   Vaping Use: Never used  Substance Use Topics   Alcohol use: Not Currently   Drug use: Never    Family History  Problem Relation Age of Onset   COPD Mother    Heart disease Mother    Diabetes Father    Heart disease Father    Colon cancer Neg Hx    Esophageal cancer Neg Hx     Allergies  Allergen Reactions   Shellfish-Derived Products Anaphylaxis   Statins Diarrhea and Nausea And Vomiting   Benazepril     High Blood Pressure 209/93   Pravastatin Diarrhea   Ketoprofen Rash    Medication list has been reviewed and updated.  Current Outpatient Medications on File Prior to Visit  Medication Sig Dispense Refill   amLODipine (NORVASC) 5  MG tablet TAKE 1 TABLET (5 MG TOTAL) BY MOUTH DAILY. 90 tablet 3   cholecalciferol (VITAMIN D) 1000 units tablet Take 1,000 Units by mouth daily.      diclofenac Sodium (VOLTAREN) 1 % GEL Apply 4 g topically 2 (two) times daily. 700 g 1   losartan (COZAAR) 50 MG tablet TAKE 1 TABLET (50 MG TOTAL) BY MOUTH DAILY. 90 tablet 3   Multiple Vitamins-Minerals (CENTRUM ADULTS PO) Take 1 tablet by mouth daily.      No current facility-administered medications on file prior to visit.    Review of Systems:  As per HPI- otherwise negative. No CP or SOB   Physical Examination: Vitals:   05/20/21 1438  BP: 120/72  Pulse: 70  Resp: 18  Temp: 98.2 F (36.8 C)  SpO2: 97%   Vitals:   05/20/21  1438  Weight: 170 lb 9.6 oz (77.4 kg)  Height: 5\' 4"  (1.626 m)   Body mass index is 29.28 kg/m. Ideal Body Weight: Weight in (lb) to have BMI = 25: 145.3  GEN: no acute distress.  Mild overweight, looks well Wearing hearing aids Skin cancer surgery side of her nose is well-healed HEENT: Atraumatic, Normocephalic.  Ears and Nose: No external deformity. CV: RRR, No M/G/R. No JVD. No thrill. No extra heart sounds. PULM: CTA B, no wheezes, crackles, rhonchi. No retractions. No resp. distress. No accessory muscle use. ABD: S, NT, ND, +BS. No rebound. No HSM. EXTR: No c/c/e PSYCH: Normally interactive. Conversant.   Assessment and Plan: Physical exam  Essential hypertension - Plan: CBC, Comprehensive metabolic panel, amLODipine (NORVASC) 5 MG tablet, losartan (COZAAR) 50 MG tablet  Basal cell carcinoma (BCC) in situ of skin  Decreased GFR - Plan: Comprehensive metabolic panel  Screening for hyperlipidemia - Plan: Lipid panel  Screening mammogram, encounter for  Fatigue, unspecified type - Plan: TSH, VITAMIN D 25 Hydroxy (Vit-D Deficiency, Fractures)  Screening for diabetes mellitus - Plan: Hemoglobin A1c  Encounter for screening mammogram for malignant neoplasm of breast - Plan: MM 3D SCREEN BREAST BILATERAL  Primary osteoarthritis of left knee - Plan: diclofenac Sodium (VOLTAREN) 1 % GEL  Physical exam today- encouraged healthy diet and exercise routine Blood pressure is well controlled, continue current medications Voltaren gel and Tylenol as needed for knee arthritis Mammogram ordered, patient declines bone density for now Will plan further follow- up pending labs.   Will plan further follow- up pending labs. This visit occurred during the SARS-CoV-2 public health emergency.  Safety protocols were in place, including screening questions prior to the visit, additional usage of staff PPE, and extensive cleaning of exam room while observing appropriate contact time as  indicated for disinfecting solutions.   Signed , MD  Addendum 7/15, received her labs as below Message to patient  Results for orders placed or performed in visit on 05/20/21  CBC  Result Value Ref Range   WBC 6.8 4.0 - 10.5 K/uL   RBC 3.86 (L) 3.87 - 5.11 Mil/uL   Platelets 191.0 150.0 - 400.0 K/uL   Hemoglobin 12.9 12.0 - 15.0 g/dL   HCT 05/22/21 78.2 - 95.6 %   MCV 96.0 78.0 - 100.0 fl   MCHC 34.7 30.0 - 36.0 g/dL   RDW 21.3 08.6 - 57.8 %  Comprehensive metabolic panel  Result Value Ref Range   Sodium 137 135 - 145 mEq/L   Potassium 4.7 3.5 - 5.1 mEq/L   Chloride 103 96 - 112 mEq/L   CO2 22 19 -  32 mEq/L   Glucose, Bld 92 70 - 99 mg/dL   BUN 36 (H) 6 - 23 mg/dL   Creatinine, Ser 8.41 (H) 0.40 - 1.20 mg/dL   Total Bilirubin 0.4 0.2 - 1.2 mg/dL   Alkaline Phosphatase 73 39 - 117 U/L   AST 17 0 - 37 U/L   ALT 16 0 - 35 U/L   Total Protein 7.4 6.0 - 8.3 g/dL   Albumin 4.4 3.5 - 5.2 g/dL   GFR 66.06 (L) >30.16 mL/min   Calcium 9.4 8.4 - 10.5 mg/dL  Hemoglobin W1U  Result Value Ref Range   Hgb A1c MFr Bld 5.9 4.6 - 6.5 %  Lipid panel  Result Value Ref Range   Cholesterol 241 (H) 0 - 200 mg/dL   Triglycerides 932.3 (H) 0.0 - 149.0 mg/dL   HDL 55.73 >22.02 mg/dL   VLDL 54.2 0.0 - 70.6 mg/dL   LDL Cholesterol 237 (H) 0 - 99 mg/dL   Total CHOL/HDL Ratio 4    NonHDL 177.82   TSH  Result Value Ref Range   TSH 1.86 0.35 - 5.50 uIU/mL  VITAMIN D 25 Hydroxy (Vit-D Deficiency, Fractures)  Result Value Ref Range   VITD 30.41 30.00 - 100.00 ng/mL

## 2021-05-18 NOTE — Patient Instructions (Addendum)
It was great to see you today, I will be in touch with your labs as soon as possible  Assuming all is well, please see me in about 6 months  I ordered a mammogram for you to have done at your convenience Also consider getting a 2nd covid booster soon

## 2021-05-20 ENCOUNTER — Other Ambulatory Visit (HOSPITAL_BASED_OUTPATIENT_CLINIC_OR_DEPARTMENT_OTHER): Payer: Self-pay

## 2021-05-20 ENCOUNTER — Other Ambulatory Visit: Payer: Self-pay

## 2021-05-20 ENCOUNTER — Ambulatory Visit (INDEPENDENT_AMBULATORY_CARE_PROVIDER_SITE_OTHER): Payer: Medicare PPO | Admitting: Family Medicine

## 2021-05-20 VITALS — BP 120/72 | HR 70 | Temp 98.2°F | Resp 18 | Ht 64.0 in | Wt 170.6 lb

## 2021-05-20 DIAGNOSIS — Z0000 Encounter for general adult medical examination without abnormal findings: Secondary | ICD-10-CM | POA: Diagnosis not present

## 2021-05-20 DIAGNOSIS — Z13220 Encounter for screening for lipoid disorders: Secondary | ICD-10-CM

## 2021-05-20 DIAGNOSIS — M1712 Unilateral primary osteoarthritis, left knee: Secondary | ICD-10-CM | POA: Diagnosis not present

## 2021-05-20 DIAGNOSIS — Z131 Encounter for screening for diabetes mellitus: Secondary | ICD-10-CM

## 2021-05-20 DIAGNOSIS — Z1231 Encounter for screening mammogram for malignant neoplasm of breast: Secondary | ICD-10-CM | POA: Diagnosis not present

## 2021-05-20 DIAGNOSIS — R944 Abnormal results of kidney function studies: Secondary | ICD-10-CM

## 2021-05-20 DIAGNOSIS — I10 Essential (primary) hypertension: Secondary | ICD-10-CM

## 2021-05-20 DIAGNOSIS — R5383 Other fatigue: Secondary | ICD-10-CM

## 2021-05-20 DIAGNOSIS — D049 Carcinoma in situ of skin, unspecified: Secondary | ICD-10-CM | POA: Diagnosis not present

## 2021-05-20 MED ORDER — DICLOFENAC SODIUM 1 % EX GEL
4.0000 g | Freq: Two times a day (BID) | CUTANEOUS | 1 refills | Status: AC
Start: 2021-05-20 — End: ?

## 2021-05-20 MED ORDER — LOSARTAN POTASSIUM 50 MG PO TABS
ORAL_TABLET | Freq: Every day | ORAL | 3 refills | Status: DC
  Filled 2021-05-20 – 2021-05-24 (×2): qty 90, 90d supply, fill #0
  Filled 2021-09-02: qty 90, 90d supply, fill #1

## 2021-05-20 MED ORDER — AMLODIPINE BESYLATE 5 MG PO TABS
ORAL_TABLET | Freq: Every day | ORAL | 3 refills | Status: DC
  Filled 2021-05-20 – 2021-05-24 (×2): qty 90, 90d supply, fill #0
  Filled 2021-09-02: qty 90, 90d supply, fill #1

## 2021-05-21 ENCOUNTER — Encounter: Payer: Self-pay | Admitting: Family Medicine

## 2021-05-21 LAB — TSH: TSH: 1.86 u[IU]/mL (ref 0.35–5.50)

## 2021-05-21 LAB — LIPID PANEL
Cholesterol: 241 mg/dL — ABNORMAL HIGH (ref 0–200)
HDL: 63.5 mg/dL (ref >39.00)
LDL Cholesterol: 139 mg/dL — ABNORMAL HIGH (ref 0–99)
NonHDL: 177.82
Total CHOL/HDL Ratio: 4
Triglycerides: 194 mg/dL — ABNORMAL HIGH (ref 0.0–149.0)
VLDL: 38.8 mg/dL (ref 0.0–40.0)

## 2021-05-21 LAB — CBC
HCT: 37 % (ref 36.0–46.0)
Hemoglobin: 12.9 g/dL (ref 12.0–15.0)
MCHC: 34.7 g/dL (ref 30.0–36.0)
MCV: 96 fl (ref 78.0–100.0)
Platelets: 191 10*3/uL (ref 150.0–400.0)
RBC: 3.86 Mil/uL — ABNORMAL LOW (ref 3.87–5.11)
RDW: 13 % (ref 11.5–15.5)
WBC: 6.8 10*3/uL (ref 4.0–10.5)

## 2021-05-21 LAB — COMPREHENSIVE METABOLIC PANEL
ALT: 16 U/L (ref 0–35)
AST: 17 U/L (ref 0–37)
Albumin: 4.4 g/dL (ref 3.5–5.2)
Alkaline Phosphatase: 73 U/L (ref 39–117)
BUN: 36 mg/dL — ABNORMAL HIGH (ref 6–23)
CO2: 22 mEq/L (ref 19–32)
Calcium: 9.4 mg/dL (ref 8.4–10.5)
Chloride: 103 mEq/L (ref 96–112)
Creatinine, Ser: 1.25 mg/dL — ABNORMAL HIGH (ref 0.40–1.20)
GFR: 40.02 mL/min — ABNORMAL LOW (ref >60.00)
Glucose, Bld: 92 mg/dL (ref 70–99)
Potassium: 4.7 mEq/L (ref 3.5–5.1)
Sodium: 137 mEq/L (ref 135–145)
Total Bilirubin: 0.4 mg/dL (ref 0.2–1.2)
Total Protein: 7.4 g/dL (ref 6.0–8.3)

## 2021-05-21 LAB — VITAMIN D 25 HYDROXY (VIT D DEFICIENCY, FRACTURES): VITD: 30.41 ng/mL (ref 30.00–100.00)

## 2021-05-21 LAB — HEMOGLOBIN A1C: Hgb A1c MFr Bld: 5.9 % (ref 4.6–6.5)

## 2021-05-24 ENCOUNTER — Other Ambulatory Visit (HOSPITAL_BASED_OUTPATIENT_CLINIC_OR_DEPARTMENT_OTHER): Payer: Self-pay

## 2021-06-09 ENCOUNTER — Ambulatory Visit: Payer: Medicare PPO | Admitting: Orthopedic Surgery

## 2021-06-10 ENCOUNTER — Other Ambulatory Visit: Payer: Self-pay

## 2021-06-10 ENCOUNTER — Ambulatory Visit: Payer: Medicare PPO | Admitting: Orthopedic Surgery

## 2021-06-10 ENCOUNTER — Telehealth: Payer: Self-pay

## 2021-06-10 NOTE — Telephone Encounter (Signed)
Scheduled patient for tomorrow morning at 1045 for bilat knee gel injections

## 2021-06-10 NOTE — Telephone Encounter (Signed)
Approved for Monovisc -bilateral knee Buy and bill Dr. August Saucer $40 copay No OOP Prior auth completed with [employer] Auth # [id number] Dates: 06/10/21-07/10/21

## 2021-06-11 ENCOUNTER — Ambulatory Visit: Payer: Medicare PPO | Admitting: Orthopedic Surgery

## 2021-06-15 ENCOUNTER — Ambulatory Visit: Payer: Medicare PPO | Admitting: Sports Medicine

## 2021-06-21 ENCOUNTER — Other Ambulatory Visit: Payer: Self-pay

## 2021-06-21 ENCOUNTER — Encounter (HOSPITAL_BASED_OUTPATIENT_CLINIC_OR_DEPARTMENT_OTHER): Payer: Self-pay

## 2021-06-21 ENCOUNTER — Ambulatory Visit (HOSPITAL_BASED_OUTPATIENT_CLINIC_OR_DEPARTMENT_OTHER)
Admission: RE | Admit: 2021-06-21 | Discharge: 2021-06-21 | Disposition: A | Discharge status: Home / Self Care | Payer: Medicare PPO | Source: Ambulatory Visit | Attending: Family Medicine | Admitting: Family Medicine

## 2021-06-21 DIAGNOSIS — Z1231 Encounter for screening mammogram for malignant neoplasm of breast: Secondary | ICD-10-CM | POA: Insufficient documentation

## 2021-06-23 ENCOUNTER — Ambulatory Visit: Payer: Medicare PPO | Admitting: Orthopedic Surgery

## 2021-06-23 ENCOUNTER — Other Ambulatory Visit: Payer: Self-pay

## 2021-06-23 DIAGNOSIS — M170 Bilateral primary osteoarthritis of knee: Secondary | ICD-10-CM

## 2021-06-25 DIAGNOSIS — M170 Bilateral primary osteoarthritis of knee: Secondary | ICD-10-CM

## 2021-06-25 NOTE — Progress Notes (Signed)
   Procedure Note  Patient: Katie Johnson             Date of Birth: Apr 19, 1936           MRN: [id number]             Visit Date: 06/23/2021  Procedures: Visit Diagnoses:  1. Primary osteoarthritis of both knees     Large Joint Inj: bilateral knee on 06/25/2021 12:56 PM Indications: pain, joint swelling and diagnostic evaluation Details: 18 G 1.5 in needle, superolateral approach  Arthrogram: No  Medications (Right): 5 mL lidocaine 1 %; 88 mg Hyaluronan 88 MG/4ML Medications (Left): 5 mL lidocaine 1 %; 88 mg Hyaluronan 88 MG/4ML Outcome: tolerated well, no immediate complications Procedure, treatment alternatives, risks and benefits explained, specific risks discussed. Consent was given by the patient. Immediately prior to procedure a time out was called to verify the correct patient, procedure, equipment, support staff and site/side marked as required. Patient was prepped and draped in the usual sterile fashion.

## 2021-06-30 ENCOUNTER — Encounter: Payer: Self-pay | Admitting: Orthopedic Surgery

## 2021-06-30 MED ORDER — HYALURONAN 88 MG/4ML IX SOSY
88.0000 mg | PREFILLED_SYRINGE | INTRA_ARTICULAR | Status: AC | PRN
  Administered 2021-06-25: 88 mg via INTRA_ARTICULAR

## 2021-06-30 MED ORDER — LIDOCAINE HCL 1 % IJ SOLN
5.0000 mL | INTRAMUSCULAR | Status: AC | PRN
  Administered 2021-06-25: 5 mL

## 2021-07-22 ENCOUNTER — Ambulatory Visit: Payer: Medicare PPO

## 2021-07-28 ENCOUNTER — Other Ambulatory Visit (HOSPITAL_BASED_OUTPATIENT_CLINIC_OR_DEPARTMENT_OTHER): Payer: Self-pay

## 2021-07-28 DIAGNOSIS — Z85828 Personal history of other malignant neoplasm of skin: Secondary | ICD-10-CM | POA: Diagnosis not present

## 2021-07-28 DIAGNOSIS — H01002 Unspecified blepharitis right lower eyelid: Secondary | ICD-10-CM | POA: Diagnosis not present

## 2021-07-28 MED ORDER — DOXYCYCLINE HYCLATE 100 MG PO TABS
100.0000 mg | ORAL_TABLET | Freq: Two times a day (BID) | ORAL | 0 refills | Status: DC
  Filled 2021-07-28: qty 60, 30d supply, fill #0

## 2021-09-02 ENCOUNTER — Other Ambulatory Visit (HOSPITAL_BASED_OUTPATIENT_CLINIC_OR_DEPARTMENT_OTHER): Payer: Self-pay

## 2021-10-13 DIAGNOSIS — H903 Sensorineural hearing loss, bilateral: Secondary | ICD-10-CM | POA: Diagnosis not present

## 2021-11-28 NOTE — Progress Notes (Addendum)
[place] at [facility] [place], [place], [place] [address] [place] [id number] [phone]  Date:  12/01/2021   Name:  Katie Johnson   DOB:  07/25/1936   MRN:  [id number]  PCP:  Darreld Mclean, MD    Chief Complaint: 6 month follow up (Concerns/ questions: none)   History of Present Illness:  Katie Johnson is a 86 y.o. very pleasant female patient who presents with the following:  Patient seen today for routine follow-up; history of hypertension, chronic kidney disease, skin cancer, arthritis Most recent visit with myself was in July for physical exam She lives at [facility] and enjoys the group activities She also sees Dr. Marlou Sa with orthopedics-she had bilateral knee injections in August.   Unfortunately did not help very much  He does NOT recommend knee replacement She uses tylenol as needed - taking about 2g a day   She had cataract surgery and is pleased with her results   She is still driving, no concerns   COVID-19 booster- recommended  Flu vaccine- give today  Can offer bone density-mammogram will be due in August, could do at that time Blood work done in July, GFR had decreased from baseline to 40; follow-up today Patient Active Problem List   Diagnosis Date Noted   Right knee pain 12/15/2020   Greater trochanteric bursitis of left hip 07/23/2019   Acute respiratory failure with hypoxia ([place]) 10/03/2018   CAP (community acquired pneumonia) 10/03/2018   Hyponatremia 10/03/2018   CKD (chronic kidney disease) stage 3, GFR 30-59 ml/min ([place]) 10/03/2018   Generalized weakness 10/03/2018   Hypophosphatemia 10/03/2018   Abnormal LFTs 10/03/2018   Macrocytic anemia 10/03/2018   Sepsis ([place]) 09/30/2018   AC (acromioclavicular) arthritis 12/21/2017   Essential hypertension 11/28/2017   Degenerative arthritis of knee, bilateral 07/15/2015   De Quervain's disease (tenosynovitis) 09/15/2011   METATARSALGIA 07/14/2010   BUNIONS,  BILATERAL 07/14/2010   FOOT PAIN, BILATERAL 07/14/2010   BACK PAIN, THORACIC REGION 03/30/2010    Past Medical History:  Diagnosis Date   Arthritis    Chicken pox    GERD (gastroesophageal reflux disease)    High blood pressure    Hypertension     Past Surgical History:  Procedure Laterality Date   COLONOSCOPY     Dr Carol Ada before 2010 per patient    ESOPHAGOGASTRODUODENOSCOPY     over 20 years ago    Social History   Tobacco Use   Smoking status: Never   Smokeless tobacco: Never  Vaping Use   Vaping Use: Never used  Substance Use Topics   Alcohol use: Not Currently   Drug use: Never    Family History  Problem Relation Age of Onset   COPD Mother    Heart disease Mother    Diabetes Father    Heart disease Father    Colon cancer Neg Hx    Esophageal cancer Neg Hx     Allergies  Allergen Reactions   Shellfish-Derived Products Anaphylaxis   Statins Diarrhea and Nausea And Vomiting   Benazepril     High Blood Pressure 209/93   Pravastatin Diarrhea   Ketoprofen Rash    Medication list has been reviewed and updated.  Current Outpatient Medications on File Prior to Visit  Medication Sig Dispense Refill   cholecalciferol (VITAMIN D) 1000 units tablet Take 1,000 Units by mouth daily.      diclofenac Sodium (VOLTAREN) 1 % GEL Apply  4 g topically 2 (two) times daily. 700 g 1   doxycycline (VIBRA-TABS) 100 MG tablet Take 1 tablet by mouth twice a day with meals 60 tablet 0   Multiple Vitamins-Minerals (CENTRUM ADULTS PO) Take 1 tablet by mouth daily.      No current facility-administered medications on file prior to visit.    Review of Systems:  As per HPI- otherwise negative.   Physical Examination: Vitals:   12/01/21 1310  BP: 134/76  Pulse: 66  Resp: 18  Temp: 98.4 F (36.9 C)  SpO2: 98%   Vitals:   12/01/21 1310  Weight: 173 lb (78.5 kg)  Height: 5\' 4"  (1.626 m)   Body mass index is 29.7 kg/m. Ideal Body Weight: Weight in (lb) to  have BMI = 25: 145.3  GEN: no acute distress. Overweight, looks well  HEENT: Atraumatic, Normocephalic.  Ears and Nose: No external deformity. CV: RRR, No M/G/R. No JVD. No thrill. No extra heart sounds. PULM: CTA B, no wheezes, crackles, rhonchi. No retractions. No resp. distress. No accessory muscle use. EXTR: No c/c/e PSYCH: Normally interactive. Conversant.   Assessment and Plan: Essential hypertension - Plan: Comprehensive metabolic panel, amLODipine (NORVASC) 5 MG tablet, losartan (COZAAR) 50 MG tablet, CANCELED: Basic metabolic panel  Chronic pain of both knees  Medication monitoring encounter - Plan: Comprehensive metabolic panel  Need for influenza vaccination - Plan: Flu Vaccine QUAD 6+ mos PF IM (Fluarix Quad PF)  Renal insufficiency Pt seen today for routine visit BP under good control, continue current meds Discussed her knee pain Tylenol use sounds appropriate Check LFTS Follow-up on renal insuf seen last summer   Flu shot given today  Will plan further follow- up pending labs.   Signed Lamar Blinks, MD  Received her labs, message to patient  Results for orders placed or performed in visit on 12/01/21  Comprehensive metabolic panel  Result Value Ref Range   Sodium 137 135 - 145 mEq/L   Potassium 4.9 3.5 - 5.1 mEq/L   Chloride 102 96 - 112 mEq/L   CO2 25 19 - 32 mEq/L   Glucose, Bld 89 70 - 99 mg/dL   BUN 29 (H) 6 - 23 mg/dL   Creatinine, Ser 1.29 (H) 0.40 - 1.20 mg/dL   Total Bilirubin 0.4 0.2 - 1.2 mg/dL   Alkaline Phosphatase 77 39 - 117 U/L   AST 14 0 - 37 U/L   ALT 12 0 - 35 U/L   Total Protein 7.4 6.0 - 8.3 g/dL   Albumin 4.4 3.5 - 5.2 g/dL   GFR 38.39 (L) >60.00 mL/min   Calcium 9.6 8.4 - 10.5 mg/dL

## 2021-11-29 DIAGNOSIS — Z961 Presence of intraocular lens: Secondary | ICD-10-CM | POA: Diagnosis not present

## 2021-11-29 DIAGNOSIS — H43813 Vitreous degeneration, bilateral: Secondary | ICD-10-CM | POA: Diagnosis not present

## 2021-11-29 DIAGNOSIS — H524 Presbyopia: Secondary | ICD-10-CM | POA: Diagnosis not present

## 2021-11-29 DIAGNOSIS — H5201 Hypermetropia, right eye: Secondary | ICD-10-CM | POA: Diagnosis not present

## 2021-11-29 DIAGNOSIS — H11153 Pinguecula, bilateral: Secondary | ICD-10-CM | POA: Diagnosis not present

## 2021-11-29 DIAGNOSIS — D23112 Other benign neoplasm of skin of right lower eyelid, including canthus: Secondary | ICD-10-CM | POA: Diagnosis not present

## 2021-11-29 DIAGNOSIS — H52203 Unspecified astigmatism, bilateral: Secondary | ICD-10-CM | POA: Diagnosis not present

## 2021-12-01 ENCOUNTER — Ambulatory Visit: Payer: Medicare PPO | Admitting: Family Medicine

## 2021-12-01 ENCOUNTER — Other Ambulatory Visit (HOSPITAL_BASED_OUTPATIENT_CLINIC_OR_DEPARTMENT_OTHER): Payer: Self-pay

## 2021-12-01 ENCOUNTER — Encounter: Payer: Self-pay | Admitting: Family Medicine

## 2021-12-01 VITALS — BP 134/76 | HR 66 | Temp 98.4°F | Resp 18 | Ht 64.0 in | Wt 173.0 lb

## 2021-12-01 DIAGNOSIS — Z5181 Encounter for therapeutic drug level monitoring: Secondary | ICD-10-CM

## 2021-12-01 DIAGNOSIS — M25561 Pain in right knee: Secondary | ICD-10-CM

## 2021-12-01 DIAGNOSIS — N289 Disorder of kidney and ureter, unspecified: Secondary | ICD-10-CM | POA: Diagnosis not present

## 2021-12-01 DIAGNOSIS — G8929 Other chronic pain: Secondary | ICD-10-CM | POA: Diagnosis not present

## 2021-12-01 DIAGNOSIS — M25562 Pain in left knee: Secondary | ICD-10-CM | POA: Diagnosis not present

## 2021-12-01 DIAGNOSIS — I10 Essential (primary) hypertension: Secondary | ICD-10-CM | POA: Diagnosis not present

## 2021-12-01 DIAGNOSIS — Z23 Encounter for immunization: Secondary | ICD-10-CM

## 2021-12-01 MED ORDER — AMLODIPINE BESYLATE 5 MG PO TABS
ORAL_TABLET | Freq: Every day | ORAL | 3 refills | Status: DC
  Filled 2021-12-01: qty 90, 90d supply, fill #0

## 2021-12-01 MED ORDER — LOSARTAN POTASSIUM 50 MG PO TABS
ORAL_TABLET | Freq: Every day | ORAL | 3 refills | Status: DC
  Filled 2021-12-01: qty 90, 90d supply, fill #0

## 2021-12-01 NOTE — Patient Instructions (Addendum)
It was good to see you again today  Flu shot given I will be in touch with your labs asap  I would recommend getting the latest covid booster if not done already Assuming all is well please see me in 6 months

## 2021-12-02 ENCOUNTER — Encounter: Payer: Self-pay | Admitting: Family Medicine

## 2021-12-02 LAB — COMPREHENSIVE METABOLIC PANEL
ALT: 12 U/L (ref 0–35)
AST: 14 U/L (ref 0–37)
Albumin: 4.4 g/dL (ref 3.5–5.2)
Alkaline Phosphatase: 77 U/L (ref 39–117)
BUN: 29 mg/dL — ABNORMAL HIGH (ref 6–23)
CO2: 25 mEq/L (ref 19–32)
Calcium: 9.6 mg/dL (ref 8.4–10.5)
Chloride: 102 mEq/L (ref 96–112)
Creatinine, Ser: 1.29 mg/dL — ABNORMAL HIGH (ref 0.40–1.20)
GFR: 38.39 mL/min — ABNORMAL LOW (ref >60.00)
Glucose, Bld: 89 mg/dL (ref 70–99)
Potassium: 4.9 mEq/L (ref 3.5–5.1)
Sodium: 137 mEq/L (ref 135–145)
Total Bilirubin: 0.4 mg/dL (ref 0.2–1.2)
Total Protein: 7.4 g/dL (ref 6.0–8.3)

## 2021-12-13 ENCOUNTER — Telehealth: Payer: Self-pay | Admitting: Family Medicine

## 2021-12-13 ENCOUNTER — Other Ambulatory Visit: Payer: Self-pay

## 2021-12-13 ENCOUNTER — Other Ambulatory Visit (HOSPITAL_BASED_OUTPATIENT_CLINIC_OR_DEPARTMENT_OTHER): Payer: Self-pay

## 2021-12-13 DIAGNOSIS — I10 Essential (primary) hypertension: Secondary | ICD-10-CM

## 2021-12-13 MED ORDER — AMLODIPINE BESYLATE 5 MG PO TABS
5.0000 mg | ORAL_TABLET | Freq: Every day | ORAL | 0 refills | Status: DC
  Filled 2021-12-13 (×2): qty 90, 90d supply, fill #0

## 2021-12-13 MED ORDER — LOSARTAN POTASSIUM 50 MG PO TABS
ORAL_TABLET | Freq: Every day | ORAL | 0 refills | Status: DC
  Filled 2021-12-13: qty 90, 90d supply, fill #0
  Filled 2021-12-13: qty 90, fill #0

## 2021-12-13 NOTE — Telephone Encounter (Signed)
Refill sent.

## 2021-12-13 NOTE — Telephone Encounter (Signed)
Pt has misplaced medications. She would like a 90 day supply if possible.   Medication:  amLODipine (NORVASC) 5 MG tablet   losartan (COZAAR) 50 MG tablet  Has the patient contacted their pharmacy? Yes.     Preferred Pharmacy: [facility]  [address], [place] [address]  Phone:  [phone]  Fax:  [phone]

## 2022-01-18 ENCOUNTER — Other Ambulatory Visit (HOSPITAL_BASED_OUTPATIENT_CLINIC_OR_DEPARTMENT_OTHER): Payer: Self-pay

## 2022-01-18 ENCOUNTER — Encounter: Payer: Self-pay | Admitting: Family Medicine

## 2022-01-18 ENCOUNTER — Ambulatory Visit: Payer: Medicare PPO | Admitting: Family Medicine

## 2022-01-18 VITALS — BP 184/88 | HR 65 | Temp 98.3°F | Resp 16 | Wt 171.0 lb

## 2022-01-18 DIAGNOSIS — H00015 Hordeolum externum left lower eyelid: Secondary | ICD-10-CM

## 2022-01-18 DIAGNOSIS — I10 Essential (primary) hypertension: Secondary | ICD-10-CM

## 2022-01-18 MED ORDER — LOSARTAN POTASSIUM 100 MG PO TABS
100.0000 mg | ORAL_TABLET | Freq: Every day | ORAL | 0 refills | Status: DC
  Filled 2022-01-18: qty 90, 90d supply, fill #0

## 2022-01-18 MED ORDER — ERYTHROMYCIN 5 MG/GM OP OINT
1.0000 "application " | TOPICAL_OINTMENT | Freq: Three times a day (TID) | OPHTHALMIC | 1 refills | Status: AC
  Filled 2022-01-18: qty 3.5, 30d supply, fill #0

## 2022-01-18 NOTE — Patient Instructions (Signed)
Start eye ointment erythromycin (antibiotic). Use a thin layer to the lower lid and allow it to melt.  ?Continue applying warm compresses.  ?If your eye is worsening over the next few days, please call the office.  ? ?Regarding your blood pressure. I am increasing your losartan to 100 mg daily.  ?Check your blood pressure at home daily and call Dr. Arlyn Dunning nurse in 2 weeks to give them your readings.  ?You should be seen for a 4-5 week follow up for your blood pressure.  ? ?Watch sodium intake.  ?

## 2022-01-18 NOTE — Progress Notes (Signed)
? ?  Subjective:  ? ? Patient ID: Katie Johnson, female    DOB: September 06, 1936, 86 y.o.   MRN: [id number] ? ?HPI ?Chief Complaint  ?Patient presents with  ? Eye Problem  ?  Complains of redness and swelling on left eye  ? ?She is here with complaints of a pimple on her left lower eyelid that appeared 2 days ago. States the area is tender and swollen. Denies fever, chills, headache, dizziness, eye pain, vision changes, foreign body sensation. She has noticed drainage in her left eye, thick and green/yellow at times.  ?Using warm compresses. ? ?Dr. Amalia Hailey is her ophthalmologist.  ? ?BP elevated today and reports good compliance with losartan 50 mg and amlodipine 5 mg ?States BP at home was in the 120-130/70-80 range for months but she has not checked BP in several weeks.  ?Denies chest pain, palpitations, shortness of breath, LE edema.  ? ? ? ?Review of Systems ?Pertinent positives and negatives in the history of present illness. ? ?   ?Objective:  ? Physical Exam ?BP (!) 184/88 (BP Location: Left Arm, Patient Position: Sitting, Cuff Size: Small)   Pulse 65   Temp 98.3 ?F (36.8 ?C) (Oral)   Resp 16   Wt 171 lb (77.6 kg)   SpO2 99%   BMI 29.35 kg/m?  ? ?Alert and in no distress. Left lower eye lid with a pea size raised red bump at the edge of her lower lid. Cardiac exam shows a regular sinus rhythm without murmurs or gallops. Lungs are clear to auscultation. LEs without edema.  ? ? ?   ?Assessment & Plan:  ?Hordeolum externum of left lower eyelid - Plan: erythromycin ophthalmic ointment ? ?Uncontrolled hypertension - Plan: losartan (COZAAR) 100 MG tablet ? ?No red flag symptoms. Antibiotic ointment prescribed and counseling done on how to properly use the medication. Continue warm compresses.  ?Follow up if the area is worsening or improving in the next week.  ?BP elevated today and she is reportedly taking daily medications and eating a low sodium diet. I will increase her losartan to 100 mg and she will continue  amlodipine at her current dose. Advised to check BP daily and call her PCP in 1-2 weeks if no improvement in BP. She will follow up with her PCP in 4-5 weeks or sooner if needed.  ?

## 2022-01-23 ENCOUNTER — Emergency Department (HOSPITAL_BASED_OUTPATIENT_CLINIC_OR_DEPARTMENT_OTHER): Payer: Medicare PPO

## 2022-01-23 ENCOUNTER — Emergency Department (HOSPITAL_BASED_OUTPATIENT_CLINIC_OR_DEPARTMENT_OTHER)
Admission: EM | Admit: 2022-01-23 | Discharge: 2022-01-23 | Disposition: A | Discharge status: Home / Self Care | Payer: Medicare PPO | Attending: Emergency Medicine | Admitting: Emergency Medicine

## 2022-01-23 ENCOUNTER — Other Ambulatory Visit: Payer: Self-pay

## 2022-01-23 DIAGNOSIS — N183 Chronic kidney disease, stage 3 unspecified: Secondary | ICD-10-CM | POA: Diagnosis not present

## 2022-01-23 DIAGNOSIS — I10 Essential (primary) hypertension: Secondary | ICD-10-CM | POA: Diagnosis not present

## 2022-01-23 DIAGNOSIS — R519 Headache, unspecified: Secondary | ICD-10-CM | POA: Diagnosis not present

## 2022-01-23 DIAGNOSIS — I129 Hypertensive chronic kidney disease with stage 1 through stage 4 chronic kidney disease, or unspecified chronic kidney disease: Secondary | ICD-10-CM | POA: Diagnosis not present

## 2022-01-23 DIAGNOSIS — R42 Dizziness and giddiness: Secondary | ICD-10-CM | POA: Diagnosis not present

## 2022-01-23 LAB — TROPONIN I (HIGH SENSITIVITY): Troponin I (High Sensitivity): 8 ng/L (ref <18)

## 2022-01-23 LAB — CBC WITH DIFFERENTIAL/PLATELET
Abs Immature Granulocytes: 0.02 10*3/uL (ref 0.00–0.07)
Basophils Absolute: 0 10*3/uL (ref 0.0–0.1)
Basophils Relative: 1 %
Eosinophils Absolute: 0.1 10*3/uL (ref 0.0–0.5)
Eosinophils Relative: 2 %
HCT: 36.2 % (ref 36.0–46.0)
Hemoglobin: 12.3 g/dL (ref 12.0–15.0)
Immature Granulocytes: 0 %
Lymphocytes Relative: 33 %
Lymphs Abs: 1.9 10*3/uL (ref 0.7–4.0)
MCH: 33.1 pg (ref 26.0–34.0)
MCHC: 34 g/dL (ref 30.0–36.0)
MCV: 97.3 fL (ref 80.0–100.0)
Monocytes Absolute: 0.6 10*3/uL (ref 0.1–1.0)
Monocytes Relative: 11 %
Neutro Abs: 3 10*3/uL (ref 1.7–7.7)
Neutrophils Relative %: 53 %
Platelets: 189 10*3/uL (ref 150–400)
RBC: 3.72 MIL/uL — ABNORMAL LOW (ref 3.87–5.11)
RDW: 12.5 % (ref 11.5–15.5)
WBC: 5.7 10*3/uL (ref 4.0–10.5)
nRBC: 0 % (ref 0.0–0.2)

## 2022-01-23 LAB — BASIC METABOLIC PANEL
Anion gap: 8 (ref 5–15)
BUN: 30 mg/dL — ABNORMAL HIGH (ref 8–23)
CO2: 24 mmol/L (ref 22–32)
Calcium: 9.7 mg/dL (ref 8.9–10.3)
Chloride: 106 mmol/L (ref 98–111)
Creatinine, Ser: 1.04 mg/dL — ABNORMAL HIGH (ref 0.44–1.00)
GFR, Estimated: 53 mL/min — ABNORMAL LOW (ref >60)
Glucose, Bld: 102 mg/dL — ABNORMAL HIGH (ref 70–99)
Potassium: 4.3 mmol/L (ref 3.5–5.1)
Sodium: 138 mmol/L (ref 135–145)

## 2022-01-23 LAB — URINALYSIS, ROUTINE W REFLEX MICROSCOPIC
Bilirubin Urine: NEGATIVE
Glucose, UA: NEGATIVE mg/dL
Hgb urine dipstick: NEGATIVE
Ketones, ur: NEGATIVE mg/dL
Leukocytes,Ua: NEGATIVE
Nitrite: NEGATIVE
Protein, ur: NEGATIVE mg/dL
Specific Gravity, Urine: 1.01 (ref 1.005–1.030)
pH: 5.5 (ref 5.0–8.0)

## 2022-01-23 MED ORDER — ACETAMINOPHEN 500 MG PO TABS
1000.0000 mg | ORAL_TABLET | Freq: Once | ORAL | Status: AC
  Administered 2022-01-23: 1000 mg via ORAL
  Filled 2022-01-23: qty 2

## 2022-01-23 NOTE — ED Triage Notes (Signed)
Pt arrives pov, slow gait to triage, c/o hypertension. Pt states "I feel drunk", c/o HA, denies CP ?

## 2022-01-23 NOTE — Discharge Instructions (Signed)
We saw in the ER for elevated blood pressure and " unsteady feeling". ? ?Your blood pressure is steadily declined on its own in the ER.  Your unsteady feeling has resolved. ? ?At this time, it is unclear why you had the symptoms.  We have reviewed your work-up and it appears that you have had similar symptoms and blood pressure issues in the past which did not reveal any concerning findings ? ?We recommend that he follow-up with your primary care doctor at this time.  Discussed with them if you need studies to look at the blood circulation to your brain and also if you need " as needed" blood pressure medication that can be taken only if your blood pressure is significantly high and you are not having any symptoms associated with it. ?

## 2022-01-23 NOTE — ED Notes (Signed)
Pt ambulated to the BR, states "feel a little unsteady on my feet" denies dizziness at this time  ?

## 2022-01-23 NOTE — ED Provider Notes (Signed)
?[facility] EMERGENCY DEPARTMENT ?Provider Note ? ? ?CSN: [id number] ?Arrival date & time: 01/23/22  1800 ? ?  ? ?History ? ?Chief Complaint  ?Patient presents with  ? Hypertension  ? ? ?Katie Johnson is a 86 y.o. female.  With past medical history of hypertension, CKD stage III who presents emergency department with hypertension. ? ?Patient states until Tuesday her blood pressure has been controlled on her home losartan and amlodipine.  She states that on Tuesday her blood pressure had increased acutely.  She states that she was seen at her outpatient primary care provider.  It appears that she had her losartan increased to 100 mg daily and she was instructed to take her blood pressure daily at home.  She states that since then her blood pressure has been labile and increasing up to 190s over 100 today.  She endorses headache and "feeling like I am drunk."  She states that she feels off balance with walking.  Endorses lower extremity swelling however she states this is chronic due to her amlodipine use.  She denies chest pain, palpitations, shortness of breath, abdominal pain or back pain.  Denies numbness or tingling to her face, arms or legs.  Denies recent illnesses. ? ? ?Hypertension ?Associated symptoms include headaches. Pertinent negatives include no chest pain, no abdominal pain and no shortness of breath.  ? ?  ? ?Home Medications ?Prior to Admission medications   ?Medication Sig Start Date End Date Taking? Authorizing Provider  ?amLODipine (NORVASC) 5 MG tablet Take 1 tablet (5 mg total) by mouth daily. 12/13/21 12/13/22  Copland, Gwenlyn Found, MD  ?cholecalciferol (VITAMIN D) 1000 units tablet Take 1,000 Units by mouth daily.     [provider]  ?diclofenac Sodium (VOLTAREN) 1 % GEL Apply 4 g topically 2 (two) times daily. 05/20/21   Copland, Gwenlyn Found, MD  ?doxycycline (VIBRA-TABS) 100 MG tablet Take 1 tablet by mouth twice a day with meals 07/28/21     ?erythromycin ophthalmic ointment Place 1  application. into the left eye 3 (three) times daily for 5 days. 01/18/22 02/17/22  Henson, Vickie L, NP-C  ?losartan (COZAAR) 100 MG tablet Take 1 tablet (100 mg total) by mouth daily. 01/18/22   Avanell Shackleton, NP-C  ?Multiple Vitamins-Minerals (CENTRUM ADULTS PO) Take 1 tablet by mouth daily.     [provider]  ?   ? ?Allergies    ?Shellfish-derived products, Statins, Benazepril, Pravastatin, and Ketoprofen   ? ?Review of Systems   ?Review of Systems  ?Constitutional:  Negative for fever.  ?Eyes:  Negative for visual disturbance.  ?Respiratory:  Negative for shortness of breath.   ?Cardiovascular:  Negative for chest pain and palpitations.  ?Gastrointestinal:  Negative for abdominal pain and vomiting.  ?Neurological:  Positive for dizziness and headaches. Negative for syncope, weakness and numbness.  ?All other systems reviewed and are negative. ? ?Physical Exam ?Updated Vital Signs ?BP (!) 163/78   Pulse 70   Temp 98.5 ?F (36.9 ?C) (Oral)   Resp 16   Ht 5\' 4"  (1.626 m)   Wt 76.7 kg   SpO2 99%   BMI 29.01 kg/m?  ?Physical Exam ?Vitals and nursing note reviewed.  ?Constitutional:   ?   General: She is not in acute distress. ?   Appearance: Normal appearance. She is normal weight. She is not ill-appearing or toxic-appearing.  ?HENT:  ?   Head: Normocephalic and atraumatic.  ?   Nose: Nose normal.  ?  Mouth/Throat:  ?   Mouth: Mucous membranes are moist.  ?   Pharynx: Oropharynx is clear.  ?Eyes:  ?   General: No scleral icterus. ?   Extraocular Movements: Extraocular movements intact.  ?   Conjunctiva/sclera: Conjunctivae normal.  ?   Pupils: Pupils are equal, round, and reactive to light.  ?Cardiovascular:  ?   Rate and Rhythm: Normal rate and regular rhythm.  ?   Pulses:     ?     Radial pulses are 2+ on the right side and 2+ on the left side.  ?     Dorsalis pedis pulses are 1+ on the right side and 1+ on the left side.  ?   Heart sounds: Normal heart sounds. No murmur heard. ?   Comments:  Trace bilateral lower extremity swelling ?Pulmonary:  ?   Effort: Pulmonary effort is normal. No respiratory distress.  ?   Breath sounds: Normal breath sounds.  ?Abdominal:  ?   General: Bowel sounds are normal. There is no distension.  ?   Palpations: Abdomen is soft.  ?   Tenderness: There is no abdominal tenderness.  ?Musculoskeletal:     ?   General: Normal range of motion.  ?   Cervical back: Neck supple.  ?   Right lower leg: Edema present.  ?   Left lower leg: Edema present.  ?Skin: ?   General: Skin is warm and dry.  ?   Capillary Refill: Capillary refill takes less than 2 seconds.  ?Neurological:  ?   General: No focal deficit present.  ?   Mental Status: She is alert and oriented to person, place, and time. Mental status is at baseline.  ?   Cranial Nerves: No cranial nerve deficit.  ?   Sensory: No sensory deficit.  ?   Motor: No weakness.  ?Psychiatric:     ?   Mood and Affect: Mood normal.     ?   Behavior: Behavior normal.     ?   Thought Content: Thought content normal.     ?   Judgment: Judgment normal.  ? ? ?ED Results / Procedures / Treatments   ?Labs ?(all labs ordered are listed, but only abnormal results are displayed) ?Labs Reviewed  ?BASIC METABOLIC PANEL  ?URINALYSIS, ROUTINE W REFLEX MICROSCOPIC  ?CBC WITH DIFFERENTIAL/PLATELET  ?TROPONIN I (HIGH SENSITIVITY)  ? ?EKG ?None ? ?Radiology ?No results found. ? ?Procedures ?Procedures  ? ? ?Medications Ordered in ED ?Medications - No data to display ? ?ED Course/ Medical Decision Making/ A&P ?  ?                        ?Medical Decision Making ?Amount and/or Complexity of Data Reviewed ?Labs: ordered. ?Radiology: ordered. ? ?86 year old female who presents to the emergency department with symptomatic hypertension.  ?She has no focal neurological deficits on exam.  She does have headache.  Initiated a hypertensive urgency work-up with basic labs, troponin, UA, chest x-ray and EKG.  Additionally I am scanning her head due to her headache as well  as complaint of dizziness. ? ?Care of patient being handed off to [place], [place] at this time.  Disposition pending completed work-up.  Please see her subsequent note for the remainder of the patient's care. ?Final Clinical Impression(s) / ED Diagnoses ?Final diagnoses:  ?None  ? ? ?Rx / DC Orders ?ED Discharge Orders   ? ? None  ? ?  ? ? ?  ?  Mickie Hillier, PA-C ?01/23/22 1851 ? ?  ?Varney Biles, MD ?01/23/22 2058 ? ?

## 2022-02-04 NOTE — Progress Notes (Signed)
Therapist, music at [facility] ?[address], [address] ?[place], [place] [address] ?336 [phone] ?Fax [phone] ? ?Date:  02/09/2022  ? ?Name:  Katie Johnson   DOB:  September 04, 1936   MRN:  [id number] ? ?PCP:  Darreld Mclean, MD  ? ? ?Chief Complaint: HYN follow up (Losartan was increased to 100 mg at last OV, still taking Amlodipine 5 mg. Was seen at ED on 01/23/22./Concerns/ questions: /) ? ? ?History of Present Illness: ? ?Katie Johnson is a 86 y.o. very pleasant female patient who presents with the following: ? ?Pt seen today for periodic follow-up  ?Last seen by  myself in January ?She was seen in the ER on 3/19 with dizziness and HTN - she was evaluated and released to home  ?A few days prior she was seen at another location with a stye, and BP was noted to be high ?They had her increase her losartan from 50 to 100 mg; however pt felt like taking 100 mg actually made her BP worse.  She notes she did not fill the 100 mg prescription, she just tried taking 2 of her 50 mg pills for a couple of days.  She has since cut back to 50 mg of losartan daily, 5 mg amlodipine ?She is checking her blood pressure a few times a day at home.  Systolic blood pressure is generally still elevated in the 1 40-1 50 range, occasionally higher ? ?She otherwise feels back to normal ? ?They did check her labs at the ER - renal function stable to improved  ? ?BP Readings from Last 3 Encounters:  ?02/09/22 (!) 160/74  ?01/23/22 (!) 151/82  ?01/18/22 (!) 184/88  ? ?Covid booster  ? ?Patient Active Problem List  ? Diagnosis Date Noted  ? Right knee pain 12/15/2020  ? Greater trochanteric bursitis of left hip 07/23/2019  ? Acute respiratory failure with hypoxia ([place]) 10/03/2018  ? CAP (community acquired pneumonia) 10/03/2018  ? Hyponatremia 10/03/2018  ? CKD (chronic kidney disease) stage 3, GFR 30-59 ml/min ([facility]) 10/03/2018  ? Generalized weakness 10/03/2018  ? Hypophosphatemia 10/03/2018  ? Abnormal LFTs 10/03/2018  ? Macrocytic  anemia 10/03/2018  ? Sepsis ([place]) 09/30/2018  ? AC (acromioclavicular) arthritis 12/21/2017  ? Essential hypertension 11/28/2017  ? Degenerative arthritis of knee, bilateral 07/15/2015  ? De Quervain's disease (tenosynovitis) 09/15/2011  ? METATARSALGIA 07/14/2010  ? BUNIONS, BILATERAL 07/14/2010  ? FOOT PAIN, BILATERAL 07/14/2010  ? BACK PAIN, THORACIC REGION 03/30/2010  ? ? ?Past Medical History:  ?Diagnosis Date  ? Arthritis   ? Chicken pox   ? GERD (gastroesophageal reflux disease)   ? High blood pressure   ? Hypertension   ? ? ?Past Surgical History:  ?Procedure Laterality Date  ? COLONOSCOPY    ? Dr Carol Ada before 2010 per patient   ? ESOPHAGOGASTRODUODENOSCOPY    ? over 20 years ago  ? ? ?Social History  ? ?Tobacco Use  ? Smoking status: Never  ? Smokeless tobacco: Never  ?Vaping Use  ? Vaping Use: Never used  ?Substance Use Topics  ? Alcohol use: Not Currently  ? Drug use: Never  ? ? ?Family History  ?Problem Relation Age of Onset  ? COPD Mother   ? Heart disease Mother   ? Diabetes Father   ? Heart disease Father   ? Colon cancer Neg Hx   ? Esophageal cancer Neg Hx   ? ? ?Allergies  ?Allergen Reactions  ?  Shellfish-Derived Products Anaphylaxis  ? Statins Diarrhea and Nausea And Vomiting  ? Benazepril   ?  High Blood Pressure 209/93  ? Pravastatin Diarrhea  ? Ketoprofen Rash  ? ? ?Medication list has been reviewed and updated. ? ?Current Outpatient Medications on File Prior to Visit  ?Medication Sig Dispense Refill  ? amLODipine (NORVASC) 5 MG tablet Take 1 tablet (5 mg total) by mouth daily. 90 tablet 0  ? cholecalciferol (VITAMIN D) 1000 units tablet Take 1,000 Units by mouth daily.     ? diclofenac Sodium (VOLTAREN) 1 % GEL Apply 4 g topically 2 (two) times daily. 700 g 1  ? doxycycline (VIBRA-TABS) 100 MG tablet Take 1 tablet by mouth twice a day with meals 60 tablet 0  ? erythromycin ophthalmic ointment Place 1 application. into the left eye 3 (three) times daily for 5 days. 3.5 g 1  ? losartan  (COZAAR) 100 MG tablet Take 1 tablet (100 mg total) by mouth daily. 90 tablet 0  ? Multiple Vitamins-Minerals (CENTRUM ADULTS PO) Take 1 tablet by mouth daily.     ? ?No current facility-administered medications on file prior to visit.  ? ? ?Review of Systems: ? ?As per HPI- otherwise negative. ? ? ?Physical Examination: ?Vitals:  ? 02/09/22 1545  ?BP: (!) 160/74  ?Pulse: 71  ?Resp: 18  ?Temp: 98.1 ?F (36.7 ?C)  ?SpO2: 96%  ? ?Vitals:  ? 02/09/22 1545  ?Weight: 170 lb (77.1 kg)  ?Height: 5\' 4"  (1.626 m)  ? ?Body mass index is 29.18 kg/m?. ?Ideal Body Weight: Weight in (lb) to have BMI = 25: 145.3 ? ?GEN: no acute distress.  Overweight, looks well  ?HEENT: Atraumatic, Normocephalic.  ?Ears and Nose: No external deformity. ?CV: RRR, No M/G/R. No JVD. No thrill. No extra heart sounds. ?PULM: CTA B, no wheezes, crackles, rhonchi. No retractions. No resp. distress. No accessory muscle use. ?EXTR: No c/c/e ?PSYCH: Normally interactive. Conversant.  ? ?Wt Readings from Last 3 Encounters:  ?02/09/22 170 lb (77.1 kg)  ?01/23/22 169 lb (76.7 kg)  ?01/18/22 171 lb (77.6 kg)  ? ? ?Assessment and Plan: ?Uncontrolled hypertension - Plan: hydrochlorothiazide (MICROZIDE) 12.5 MG capsule, losartan (COZAAR) 50 MG tablet ? ?Patient seen today for follow-up from the ER, she was seen with symptoms of dizziness and hypertension.  Dizziness is resolved ?Patient is quite convinced that going up on losartan will cause her blood pressure to go higher.  She cannot tolerate a higher dose of amlodipine due to lower extremity swelling.  She has used hydrochlorothiazide in the past and did fine, we stopped it due to her kidney function worsening from baseline.  However currently her kidneys look quite good with a GFR of 53.  We will try using hydrochlorothiazide 12.5 on an as-needed basis for elevated blood pressure ? ?I have asked her to see me back in 3 months for follow-up, recheck BMP at that time ? ?Signed ?Lamar Blinks, MD ? ?

## 2022-02-09 ENCOUNTER — Ambulatory Visit: Payer: Medicare PPO | Admitting: Family Medicine

## 2022-02-09 ENCOUNTER — Other Ambulatory Visit (HOSPITAL_BASED_OUTPATIENT_CLINIC_OR_DEPARTMENT_OTHER): Payer: Self-pay

## 2022-02-09 DIAGNOSIS — I10 Essential (primary) hypertension: Secondary | ICD-10-CM

## 2022-02-09 MED ORDER — HYDROCHLOROTHIAZIDE 12.5 MG PO CAPS
12.5000 mg | ORAL_CAPSULE | Freq: Every day | ORAL | 6 refills | Status: AC
  Filled 2022-02-09: qty 30, 30d supply, fill #0

## 2022-02-09 NOTE — Patient Instructions (Signed)
Good to see you again today ?Let's try adding 12.5 mg of hydrochlorothiazide daily as needed for BP higher than 140/90; let me know how this works out for you ?Please see me in 3 months to follow-up and check labs  ?

## 2022-03-31 ENCOUNTER — Ambulatory Visit: Payer: Medicare PPO | Admitting: Sports Medicine

## 2022-03-31 VITALS — BP 144/82 | Ht 64.0 in | Wt 170.0 lb

## 2022-03-31 DIAGNOSIS — G8929 Other chronic pain: Secondary | ICD-10-CM | POA: Diagnosis not present

## 2022-03-31 DIAGNOSIS — M545 Low back pain, unspecified: Secondary | ICD-10-CM | POA: Diagnosis not present

## 2022-03-31 DIAGNOSIS — M25562 Pain in left knee: Secondary | ICD-10-CM

## 2022-03-31 DIAGNOSIS — M25561 Pain in right knee: Secondary | ICD-10-CM

## 2022-03-31 NOTE — Progress Notes (Signed)
Katie Johnson is a 86 y.o. 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 X-rays of right knee from 2021 show mild osteoarthritis with spurring in the lateral compartment and moderate to severe patellofemoral arthritis Had gel injections in June 2022 that didn't improve her pain Overall can do what she would like, does strengthening and stretching classes 2 times a week and also uses the recumbent bike She gardens without difficulty She does find stairs to be the most bothersome Reports that she has had prior cortisone injections in the past have done very well for her, none in the last year Overall she would like opinions on what she can do about her knees, as she does not want to have a knee replacement  Low Back Pain This is a chronic issue for many years No radicular symptoms Denies any saddle anesthesias, change in bowel or bladder habits, leg weakness Hurts more when she turns to rotate It seems that she has arthritis but has never seen anyone about her back   PMH reviewed.  ROS as above. Medications reviewed.  Exam:  BP (!) 144/82   Ht 5\' 4"  (1.626 m)   Wt 170 lb (77.1 kg)   BMI 29.18 kg/m  Gen: Well NAD MSK:  Bilateral Knees: - Inspection: no gross deformity b/l. No swelling/effusion, erythema or bruising b/l. Skin intact - Palpation: Boggy synovitis bilaterally with tenderness palpation in medial and lateral joint lines bilaterally. - ROM: full active ROM with flexion and extension in knee and hip b/l.   - Strength: 5/5 strength b/l aside from 4-5 hip abduction bilaterally. - Neuro/vasc: NV intact distally b/l - Special Tests: - LIGAMENTS: negative anterior and posterior drawer, negative Lachman's, no MCL or LCL laxity  -- MENISCUS: Positive McMurray's bilaterally -- PF JOINT: Limited patellar mobility  with significant severe patellar crepitus  Hips: normal ROM  Lumbar spine: - Inspection: no gross deformity or asymmetry, swelling or ecchymosis - Palpation: No TTP over the spinous processes, paraspinal muscles, or SI joints b/l - ROM: full active ROM of the lumbar spine in flexion and extension without pain, slight decrease in rotation to the left with some pain, otherwise no significant decrease in range of motion on rotation or side bending - Strength: 5/5 strength of lower extremity in L4-S1 nerve root distributions b/l; normal gait - Neuro: sensation intact in the L4-S1 nerve root distribution b/l - Special testing: Negative seated slump bilaterally    No results found.   Assessment and Plan: 1) Bilateral chronic knee pain Severe patellofemoral compartment arthritis, medial and lateral compartments are preserved.  We will have her work on hip abduction and isometric quad exercises.  Discussed with her that we could perform cortisone injections in the future if she would like.  She is not open to surgery at this time and given that her arthritis is primarily patellofemoral, this would likely not be the best option for her.  She will continue with conservative management.  Continue with Tylenol and Voltaren gel as needed.  Follow-up as needed.  Chronic bilateral low back pain without sciatica No red flag symptoms.  Suspect that this may be related to arthropathy.  Overall she has good range of motion and functionally does very well.  We will add knee-to-chest and pelvic tilt exercises to her current regimen, but continue to remain active  and continue her stretching and strengthening classes.  Follow-up as needed.   Luis Abed, D.O.  PGY-4 [facility]  03/31/2022 3:50 PM  I observed and examined the patient with the [facility] resident and agree with assessment and plan.  Note reviewed and modified by me. Sterling Big, MD

## 2022-03-31 NOTE — Assessment & Plan Note (Signed)
No red flag symptoms.  Suspect that this may be related to arthropathy.  Overall she has good range of motion and functionally does very well.  We will add knee-to-chest and pelvic tilt exercises to her current regimen, but continue to remain active and continue her stretching and strengthening classes.  Follow-up as needed.

## 2022-03-31 NOTE — Patient Instructions (Signed)
Thank you for coming to see me today. It was a pleasure. Today we talked about:   You can see [place] for an injection as needed as this may be helpful.  We would do this above your kneecap so that it would drain underneath of it.  Continue with your exercises, you have been doing a great job.  We will give you some exercises for your back, hip strengthening, knees.  Please do these 3 times a week, on the days that you are not doing your exercise class.  Please follow-up with [place] as needed.  If you have any questions or concerns, please do not hesitate to call the office at [phone].  Best,   Luis Abed, DO [facility]

## 2022-03-31 NOTE — Assessment & Plan Note (Signed)
Severe patellofemoral compartment arthritis, medial and lateral compartments are preserved.  We will have her work on hip abduction and isometric quad exercises.  Discussed with her that we could perform cortisone injections in the future if she would like.  She is not open to surgery at this time and given that her arthritis is primarily patellofemoral, this would likely not be the best option for her.  She will continue with conservative management.  Continue with Tylenol and Voltaren gel as needed.  Follow-up as needed.

## 2022-05-16 ENCOUNTER — Ambulatory Visit: Payer: Medicare PPO | Admitting: Family Medicine

## 2022-05-27 NOTE — Progress Notes (Addendum)
[facility] at [facility] [address] [place], [place] [address] [id number] [phone]  Date:  06/01/2022   Name:  Katie Johnson   DOB:  [birth year]   MRN:  [id number]  PCP:  Darreld Mclean, MD    Chief Complaint: 3 month follow up (Concerns/ questions: 1. Questions about BP meds. - Amlodipine- fluid retention, Losartan - dizziness, fatigue, the active ingredients in the medication. Pt says she has had 3 trips to the ER for elevated BP and this makes her feel bad. /)   History of Present Illness:  Katie Johnson is a 86 y.o. very pleasant female patient who presents with the following:  Patient is seen today for periodic follow-up Most recent visit with myself was in April for follow-up from the [place] pressure concern:  Patient seen today for follow-up from the ER, she was seen with symptoms of dizziness and hypertension.  Dizziness is resolved Patient is quite convinced that going up on losartan will cause her blood pressure to go higher.  She cannot tolerate a higher dose of amlodipine due to lower extremity swelling.  She has used hydrochlorothiazide in the past and did fine, we stopped it due to her kidney function worsening from baseline.  However currently her kidneys look quite good with a GFR of 53.  We will try using hydrochlorothiazide 12.5 on an as-needed basis for elevated blood pressure I have asked her to see me back in 3 months for follow-up, recheck BMP at that time  She did some traveling since our last visit- did [place]  Current BP meds  Amlodipine 5 mg Hctz 12.5 Losartan 50  She feels like there is "fluid in my feet and ankles, when I lay down at night it goes to my kidneys and I am going to the bathroom 5-6x a night" which is making her tired She feels like this is due to amlodipine  She feels like she is tired, lightheaded and dizzy from using losartan.  She read online this could be a side  effect  She decided that her electrolytes "must be all out of balance" (of note always normal at each blood draw we have done)-she notes she saw a chart online somewhere which made her think that her electrolytes must be "out of balance"  She read that losartan is "no longer being used", she would like to switch to irbesartan if possible BP Readings from Last 3 Encounters:  06/01/22 132/80  03/31/22 (!) 144/82  02/09/22 (!) 160/74     Patient Active Problem List   Diagnosis Date Noted   Chronic bilateral low back pain without sciatica 03/31/2022   Bilateral chronic knee pain 12/15/2020   Greater trochanteric bursitis of left hip 07/23/2019   Acute respiratory failure with hypoxia ([place]) 10/03/2018   CAP (community acquired pneumonia) 10/03/2018   Hyponatremia 10/03/2018   CKD (chronic kidney disease) stage 3, GFR 30-59 ml/min (HCC) 10/03/2018   Generalized weakness 10/03/2018   Hypophosphatemia 10/03/2018   Abnormal LFTs 10/03/2018   Macrocytic anemia 10/03/2018   Sepsis ([place]) 09/30/2018   AC (acromioclavicular) arthritis 12/21/2017   Essential hypertension 11/28/2017   Degenerative arthritis of knee, bilateral 07/15/2015   De Quervain's disease (tenosynovitis) 09/15/2011   METATARSALGIA 07/14/2010   BUNIONS, BILATERAL 07/14/2010   FOOT PAIN, BILATERAL 07/14/2010   BACK PAIN, THORACIC REGION 03/30/2010    Past Medical History:  Diagnosis Date   Arthritis  Chicken pox    GERD (gastroesophageal reflux disease)    High blood pressure    Hypertension     Past Surgical History:  Procedure Laterality Date   COLONOSCOPY     Dr Jeani Hawking before 2010 per patient    ESOPHAGOGASTRODUODENOSCOPY     over 20 years ago    Social History   Tobacco Use   Smoking status: Never   Smokeless tobacco: Never  Vaping Use   Vaping Use: Never used  Substance Use Topics   Alcohol use: Not Currently   Drug use: Never    Family History  Problem Relation Age of Onset   COPD  Mother    Heart disease Mother    Diabetes Father    Heart disease Father    Colon cancer Neg Hx    Esophageal cancer Neg Hx     Allergies  Allergen Reactions   Shellfish-Derived Products Anaphylaxis   Statins Diarrhea and Nausea And Vomiting   Benazepril     High Blood Pressure 209/93   Pravastatin Diarrhea   Ketoprofen Rash    Medication list has been reviewed and updated.  Current Outpatient Medications on File Prior to Visit  Medication Sig Dispense Refill   amLODipine (NORVASC) 5 MG tablet Take 1 tablet (5 mg total) by mouth daily. 90 tablet 0   cholecalciferol (VITAMIN D) 1000 units tablet Take 1,000 Units by mouth daily.      diclofenac Sodium (VOLTAREN) 1 % GEL Apply 4 g topically 2 (two) times daily. 700 g 1   hydrochlorothiazide (MICROZIDE) 12.5 MG capsule Take 1 capsule (12.5 mg total) by mouth daily. Take daily as needed for BP higher than 140/90 30 capsule 6   losartan (COZAAR) 50 MG tablet Take 1 tablet (50 mg total) by mouth daily. 90 tablet 3   Multiple Vitamins-Minerals (CENTRUM ADULTS PO) Take 1 tablet by mouth daily.      No current facility-administered medications on file prior to visit.    Review of Systems:  As per HPI- otherwise negative.   Physical Examination: Vitals:   06/01/22 1539  BP: 132/80  Pulse: 78  Resp: 18  Temp: 97.8 F (36.6 C)  SpO2: 98%   Vitals:   06/01/22 1539  Weight: 175 lb 3.2 oz (79.5 kg)  Height: 5\' 4"  (1.626 m)   Body mass index is 30.07 kg/m. Ideal Body Weight: Weight in (lb) to have BMI = 25: 145.3  GEN: no acute distress.  Mild obesity, looks well HEENT: Atraumatic, Normocephalic.  Ears and Nose: No external deformity. CV: RRR, No M/G/R. No JVD. No thrill. No extra heart sounds. PULM: CTA B, no wheezes, crackles, rhonchi. No retractions. No resp. distress. No accessory muscle use. ABD: S, NT, ND, +BS. No rebound. No HSM. EXTR: No c/c/e PSYCH: Normally interactive. Conversant.  At this time she has no  significant ankle swelling, see around 4 PM  Assessment and Plan: Essential hypertension - Plan: Comprehensive metabolic panel, irbesartan (AVAPRO) 150 MG tablet  Medication monitoring encounter - Plan: Comprehensive metabolic panel  Patient seen today with a few concerns.  She tends to have concerns about her blood pressure medications.  She would feel more comfortable changing to a different ARB, this is fine.  I changed her to ibersartan 150 which is about equivalent to losartan 50  Advised her we can stop amlodipine if she wishes, on further reflection she would like to continue taking this.  She is not currently taking hydrochlorothiazide on any regular  basis  We will follow-up on her renal function electrolytes today  Discussed her urinary frequency, I suspect this is a bladder issue as opposed to being due to amlodipine.  Offered to have her try antispasmodic or see urology, she declines at this time  Plan to recheck in 6 months assuming all is well  Signed Abbe Amsterdam, MD  Addendum 7/27, received labs as below.  Message to patient Results for orders placed or performed in visit on 06/01/22  Comprehensive metabolic panel  Result Value Ref Range   Sodium 140 135 - 145 mEq/L   Potassium 5.2 (H) 3.5 - 5.1 mEq/L   Chloride 103 96 - 112 mEq/L   CO2 27 19 - 32 mEq/L   Glucose, Bld 144 (H) 70 - 99 mg/dL   BUN 29 (H) 6 - 23 mg/dL   Creatinine, Ser 5.42 0.40 - 1.20 mg/dL   Total Bilirubin 0.4 0.2 - 1.2 mg/dL   Alkaline Phosphatase 73 39 - 117 U/L   AST 14 0 - 37 U/L   ALT 13 0 - 35 U/L   Total Protein 7.4 6.0 - 8.3 g/dL   Albumin 4.5 3.5 - 5.2 g/dL   GFR 70.62 (L) >37.62 mL/min   Calcium 9.4 8.4 - 10.5 mg/dL   Renal function stable going back to 2019

## 2022-06-01 ENCOUNTER — Other Ambulatory Visit (HOSPITAL_BASED_OUTPATIENT_CLINIC_OR_DEPARTMENT_OTHER): Payer: Self-pay

## 2022-06-01 ENCOUNTER — Ambulatory Visit (INDEPENDENT_AMBULATORY_CARE_PROVIDER_SITE_OTHER): Payer: Medicare PPO | Admitting: Family Medicine

## 2022-06-01 ENCOUNTER — Other Ambulatory Visit: Payer: Self-pay | Admitting: Family Medicine

## 2022-06-01 VITALS — BP 132/80 | HR 78 | Temp 97.8°F | Resp 18 | Ht 64.0 in | Wt 175.2 lb

## 2022-06-01 DIAGNOSIS — I10 Essential (primary) hypertension: Secondary | ICD-10-CM

## 2022-06-01 DIAGNOSIS — Z5181 Encounter for therapeutic drug level monitoring: Secondary | ICD-10-CM

## 2022-06-01 MED ORDER — AMLODIPINE BESYLATE 5 MG PO TABS
5.0000 mg | ORAL_TABLET | Freq: Every day | ORAL | 1 refills | Status: AC
  Filled 2022-06-01: qty 90, 90d supply, fill #0
  Filled 2022-08-29 – 2022-09-01 (×2): qty 90, 90d supply, fill #1

## 2022-06-01 MED ORDER — IRBESARTAN 150 MG PO TABS
150.0000 mg | ORAL_TABLET | Freq: Every day | ORAL | 6 refills | Status: AC
  Filled 2022-06-01: qty 30, 30d supply, fill #0
  Filled 2022-07-01: qty 30, 30d supply, fill #1
  Filled 2022-08-09: qty 30, 30d supply, fill #2
  Filled 2022-09-19: qty 30, 30d supply, fill #3
  Filled 2022-10-18: qty 30, 30d supply, fill #4
  Filled 2022-11-23: qty 30, 30d supply, fill #5
  Filled 2023-01-11: qty 30, 30d supply, fill #6

## 2022-06-01 NOTE — Patient Instructions (Signed)
Good to see you today- I will be in touch with your labs We will have you stop losartan and take irbesartan instead (50 mg losartan is equal in strength to 150 mg of irbesartan  Assuming all is ok please see me in about 6 months

## 2022-06-02 ENCOUNTER — Encounter: Payer: Self-pay | Admitting: Family Medicine

## 2022-06-02 LAB — COMPREHENSIVE METABOLIC PANEL
ALT: 13 U/L (ref 0–35)
AST: 14 U/L (ref 0–37)
Albumin: 4.5 g/dL (ref 3.5–5.2)
Alkaline Phosphatase: 73 U/L (ref 39–117)
BUN: 29 mg/dL — ABNORMAL HIGH (ref 6–23)
CO2: 27 mEq/L (ref 19–32)
Calcium: 9.4 mg/dL (ref 8.4–10.5)
Chloride: 103 mEq/L (ref 96–112)
Creatinine, Ser: 1.13 mg/dL (ref 0.40–1.20)
GFR: 44.84 mL/min — ABNORMAL LOW (ref >60.00)
Glucose, Bld: 144 mg/dL — ABNORMAL HIGH (ref 70–99)
Potassium: 5.2 mEq/L — ABNORMAL HIGH (ref 3.5–5.1)
Sodium: 140 mEq/L (ref 135–145)
Total Bilirubin: 0.4 mg/dL (ref 0.2–1.2)
Total Protein: 7.4 g/dL (ref 6.0–8.3)

## 2022-06-14 DIAGNOSIS — M1712 Unilateral primary osteoarthritis, left knee: Secondary | ICD-10-CM | POA: Diagnosis not present

## 2022-06-15 ENCOUNTER — Telehealth: Payer: Self-pay | Admitting: Family Medicine

## 2022-06-15 ENCOUNTER — Encounter: Payer: Self-pay | Admitting: Family Medicine

## 2022-06-15 DIAGNOSIS — Z01810 Encounter for preprocedural cardiovascular examination: Secondary | ICD-10-CM

## 2022-06-15 NOTE — Telephone Encounter (Signed)
Pt is needing knee surgery, and is needing forms filled out by pcp. Forms placed in provider's box up front.

## 2022-06-15 NOTE — Telephone Encounter (Signed)
Forms in folder

## 2022-06-16 ENCOUNTER — Encounter (HOSPITAL_COMMUNITY): Payer: Self-pay | Admitting: Family Medicine

## 2022-06-17 ENCOUNTER — Encounter: Payer: Self-pay | Admitting: Family Medicine

## 2022-07-01 ENCOUNTER — Other Ambulatory Visit (HOSPITAL_BASED_OUTPATIENT_CLINIC_OR_DEPARTMENT_OTHER): Payer: Self-pay

## 2022-07-12 DIAGNOSIS — I10 Essential (primary) hypertension: Secondary | ICD-10-CM | POA: Diagnosis not present

## 2022-07-12 DIAGNOSIS — E7800 Pure hypercholesterolemia, unspecified: Secondary | ICD-10-CM | POA: Diagnosis not present

## 2022-07-12 DIAGNOSIS — M170 Bilateral primary osteoarthritis of knee: Secondary | ICD-10-CM | POA: Diagnosis not present

## 2022-07-12 DIAGNOSIS — Z0000 Encounter for general adult medical examination without abnormal findings: Secondary | ICD-10-CM | POA: Diagnosis not present

## 2022-07-12 DIAGNOSIS — Z23 Encounter for immunization: Secondary | ICD-10-CM | POA: Diagnosis not present

## 2022-07-14 ENCOUNTER — Telehealth (HOSPITAL_COMMUNITY): Payer: Self-pay | Admitting: Family Medicine

## 2022-07-14 NOTE — Telephone Encounter (Signed)
Patient called and cancelled the scheduled Myoview you ordered. Patient did not wish to reschedule. Order will be removed from the active NUC WQ. Thank you

## 2022-07-18 ENCOUNTER — Telehealth: Payer: Self-pay | Admitting: Family Medicine

## 2022-07-18 NOTE — Telephone Encounter (Signed)
I called patient to scheduled AWV.  Patient returned my call and said she has a new PCP.  Patient said she has transferred to [facility].  Patient said her husband goes to them and it was easier for her to go there. Please removed PCP.

## 2022-07-21 ENCOUNTER — Encounter (HOSPITAL_COMMUNITY): Payer: Medicare PPO

## 2022-07-22 ENCOUNTER — Encounter (HOSPITAL_COMMUNITY): Payer: Medicare PPO

## 2022-08-09 ENCOUNTER — Other Ambulatory Visit (HOSPITAL_BASED_OUTPATIENT_CLINIC_OR_DEPARTMENT_OTHER): Payer: Self-pay

## 2022-08-29 ENCOUNTER — Other Ambulatory Visit (HOSPITAL_BASED_OUTPATIENT_CLINIC_OR_DEPARTMENT_OTHER): Payer: Self-pay

## 2022-09-01 ENCOUNTER — Other Ambulatory Visit (HOSPITAL_BASED_OUTPATIENT_CLINIC_OR_DEPARTMENT_OTHER): Payer: Self-pay

## 2022-09-01 DIAGNOSIS — J069 Acute upper respiratory infection, unspecified: Secondary | ICD-10-CM | POA: Diagnosis not present

## 2022-09-15 DIAGNOSIS — M25561 Pain in right knee: Secondary | ICD-10-CM | POA: Diagnosis not present

## 2022-09-15 DIAGNOSIS — M170 Bilateral primary osteoarthritis of knee: Secondary | ICD-10-CM | POA: Diagnosis not present

## 2022-09-15 DIAGNOSIS — G8929 Other chronic pain: Secondary | ICD-10-CM | POA: Diagnosis not present

## 2022-09-19 ENCOUNTER — Other Ambulatory Visit (HOSPITAL_BASED_OUTPATIENT_CLINIC_OR_DEPARTMENT_OTHER): Payer: Self-pay

## 2022-10-05 DIAGNOSIS — M2240 Chondromalacia patellae, unspecified knee: Secondary | ICD-10-CM | POA: Diagnosis not present

## 2022-10-11 DIAGNOSIS — Z01810 Encounter for preprocedural cardiovascular examination: Secondary | ICD-10-CM | POA: Diagnosis not present

## 2022-10-11 DIAGNOSIS — I10 Essential (primary) hypertension: Secondary | ICD-10-CM | POA: Diagnosis not present

## 2022-10-18 ENCOUNTER — Other Ambulatory Visit (HOSPITAL_BASED_OUTPATIENT_CLINIC_OR_DEPARTMENT_OTHER): Payer: Self-pay

## 2022-10-20 DIAGNOSIS — M6281 Muscle weakness (generalized): Secondary | ICD-10-CM | POA: Diagnosis not present

## 2022-10-20 DIAGNOSIS — M2241 Chondromalacia patellae, right knee: Secondary | ICD-10-CM | POA: Diagnosis not present

## 2022-10-20 DIAGNOSIS — M2242 Chondromalacia patellae, left knee: Secondary | ICD-10-CM | POA: Diagnosis not present

## 2022-10-24 DIAGNOSIS — E875 Hyperkalemia: Secondary | ICD-10-CM | POA: Diagnosis not present

## 2022-10-25 DIAGNOSIS — M6281 Muscle weakness (generalized): Secondary | ICD-10-CM | POA: Diagnosis not present

## 2022-10-25 DIAGNOSIS — M2242 Chondromalacia patellae, left knee: Secondary | ICD-10-CM | POA: Diagnosis not present

## 2022-10-25 DIAGNOSIS — M2241 Chondromalacia patellae, right knee: Secondary | ICD-10-CM | POA: Diagnosis not present

## 2022-10-27 DIAGNOSIS — M6281 Muscle weakness (generalized): Secondary | ICD-10-CM | POA: Diagnosis not present

## 2022-10-27 DIAGNOSIS — M2242 Chondromalacia patellae, left knee: Secondary | ICD-10-CM | POA: Diagnosis not present

## 2022-10-27 DIAGNOSIS — M2241 Chondromalacia patellae, right knee: Secondary | ICD-10-CM | POA: Diagnosis not present

## 2022-11-01 DIAGNOSIS — M2242 Chondromalacia patellae, left knee: Secondary | ICD-10-CM | POA: Diagnosis not present

## 2022-11-01 DIAGNOSIS — M6281 Muscle weakness (generalized): Secondary | ICD-10-CM | POA: Diagnosis not present

## 2022-11-01 DIAGNOSIS — M2241 Chondromalacia patellae, right knee: Secondary | ICD-10-CM | POA: Diagnosis not present

## 2022-11-08 DIAGNOSIS — M6281 Muscle weakness (generalized): Secondary | ICD-10-CM | POA: Diagnosis not present

## 2022-11-08 DIAGNOSIS — M2242 Chondromalacia patellae, left knee: Secondary | ICD-10-CM | POA: Diagnosis not present

## 2022-11-08 DIAGNOSIS — M2241 Chondromalacia patellae, right knee: Secondary | ICD-10-CM | POA: Diagnosis not present

## 2022-11-10 DIAGNOSIS — M6281 Muscle weakness (generalized): Secondary | ICD-10-CM | POA: Diagnosis not present

## 2022-11-10 DIAGNOSIS — M2241 Chondromalacia patellae, right knee: Secondary | ICD-10-CM | POA: Diagnosis not present

## 2022-11-10 DIAGNOSIS — M2242 Chondromalacia patellae, left knee: Secondary | ICD-10-CM | POA: Diagnosis not present

## 2022-11-16 DIAGNOSIS — M2241 Chondromalacia patellae, right knee: Secondary | ICD-10-CM | POA: Diagnosis not present

## 2022-11-16 DIAGNOSIS — M6281 Muscle weakness (generalized): Secondary | ICD-10-CM | POA: Diagnosis not present

## 2022-11-16 DIAGNOSIS — M2242 Chondromalacia patellae, left knee: Secondary | ICD-10-CM | POA: Diagnosis not present

## 2022-11-22 DIAGNOSIS — M2242 Chondromalacia patellae, left knee: Secondary | ICD-10-CM | POA: Diagnosis not present

## 2022-11-22 DIAGNOSIS — M2241 Chondromalacia patellae, right knee: Secondary | ICD-10-CM | POA: Diagnosis not present

## 2022-11-22 DIAGNOSIS — M6281 Muscle weakness (generalized): Secondary | ICD-10-CM | POA: Diagnosis not present

## 2022-11-23 ENCOUNTER — Other Ambulatory Visit (HOSPITAL_BASED_OUTPATIENT_CLINIC_OR_DEPARTMENT_OTHER): Payer: Self-pay

## 2022-11-23 ENCOUNTER — Other Ambulatory Visit: Payer: Self-pay | Admitting: Family Medicine

## 2022-11-23 DIAGNOSIS — I10 Essential (primary) hypertension: Secondary | ICD-10-CM

## 2022-11-24 ENCOUNTER — Other Ambulatory Visit (HOSPITAL_BASED_OUTPATIENT_CLINIC_OR_DEPARTMENT_OTHER): Payer: Self-pay

## 2022-11-24 DIAGNOSIS — M2241 Chondromalacia patellae, right knee: Secondary | ICD-10-CM | POA: Diagnosis not present

## 2022-11-24 DIAGNOSIS — M6281 Muscle weakness (generalized): Secondary | ICD-10-CM | POA: Diagnosis not present

## 2022-11-24 DIAGNOSIS — M2242 Chondromalacia patellae, left knee: Secondary | ICD-10-CM | POA: Diagnosis not present

## 2022-11-29 DIAGNOSIS — M2242 Chondromalacia patellae, left knee: Secondary | ICD-10-CM | POA: Diagnosis not present

## 2022-11-29 DIAGNOSIS — M6281 Muscle weakness (generalized): Secondary | ICD-10-CM | POA: Diagnosis not present

## 2022-11-29 DIAGNOSIS — M2241 Chondromalacia patellae, right knee: Secondary | ICD-10-CM | POA: Diagnosis not present

## 2022-12-01 DIAGNOSIS — M2242 Chondromalacia patellae, left knee: Secondary | ICD-10-CM | POA: Diagnosis not present

## 2022-12-01 DIAGNOSIS — M6281 Muscle weakness (generalized): Secondary | ICD-10-CM | POA: Diagnosis not present

## 2022-12-01 DIAGNOSIS — M2241 Chondromalacia patellae, right knee: Secondary | ICD-10-CM | POA: Diagnosis not present

## 2022-12-06 DIAGNOSIS — M2242 Chondromalacia patellae, left knee: Secondary | ICD-10-CM | POA: Diagnosis not present

## 2022-12-06 DIAGNOSIS — M2241 Chondromalacia patellae, right knee: Secondary | ICD-10-CM | POA: Diagnosis not present

## 2022-12-06 DIAGNOSIS — M6281 Muscle weakness (generalized): Secondary | ICD-10-CM | POA: Diagnosis not present

## 2022-12-08 ENCOUNTER — Other Ambulatory Visit (HOSPITAL_BASED_OUTPATIENT_CLINIC_OR_DEPARTMENT_OTHER): Payer: Self-pay

## 2022-12-08 MED ORDER — AMLODIPINE BESYLATE 5 MG PO TABS
5.0000 mg | ORAL_TABLET | Freq: Every day | ORAL | 3 refills | Status: DC
  Filled 2022-12-08 – 2022-12-19 (×2): qty 30, 30d supply, fill #0
  Filled 2023-01-11: qty 30, 30d supply, fill #1
  Filled 2023-05-08: qty 30, 30d supply, fill #2
  Filled 2023-06-08 – 2023-09-18 (×3): qty 30, 30d supply, fill #3

## 2022-12-08 MED ORDER — IRBESARTAN 150 MG PO TABS
150.0000 mg | ORAL_TABLET | Freq: Every day | ORAL | 3 refills | Status: DC
  Filled 2022-12-08 – 2022-12-19 (×2): qty 30, 30d supply, fill #0
  Filled 2023-05-08: qty 30, 30d supply, fill #1
  Filled 2023-06-08 – 2023-09-18 (×3): qty 30, 30d supply, fill #2
  Filled ????-??-??: fill #0

## 2022-12-14 ENCOUNTER — Other Ambulatory Visit (HOSPITAL_BASED_OUTPATIENT_CLINIC_OR_DEPARTMENT_OTHER): Payer: Self-pay

## 2022-12-19 ENCOUNTER — Other Ambulatory Visit (HOSPITAL_BASED_OUTPATIENT_CLINIC_OR_DEPARTMENT_OTHER): Payer: Self-pay

## 2022-12-26 ENCOUNTER — Other Ambulatory Visit (HOSPITAL_BASED_OUTPATIENT_CLINIC_OR_DEPARTMENT_OTHER): Payer: Self-pay

## 2023-01-02 ENCOUNTER — Other Ambulatory Visit (HOSPITAL_BASED_OUTPATIENT_CLINIC_OR_DEPARTMENT_OTHER): Payer: Self-pay

## 2023-01-02 MED ORDER — ASPIRIN 81 MG PO TBEC
81.0000 mg | DELAYED_RELEASE_TABLET | Freq: Two times a day (BID) | ORAL | 0 refills | Status: AC
  Filled 2023-01-02: qty 120, 60d supply, fill #0

## 2023-01-02 MED ORDER — DOCUSATE SODIUM 100 MG PO CAPS
100.0000 mg | ORAL_CAPSULE | Freq: Two times a day (BID) | ORAL | 0 refills | Status: AC | PRN
  Filled 2023-01-02: qty 100, 50d supply, fill #0

## 2023-01-02 MED ORDER — ONDANSETRON 4 MG PO TBDP
4.0000 mg | ORAL_TABLET | Freq: Three times a day (TID) | ORAL | 0 refills | Status: AC | PRN
  Filled 2023-01-02: qty 20, 7d supply, fill #0

## 2023-01-02 MED ORDER — OXYCODONE HCL 5 MG PO TABS
5.0000 mg | ORAL_TABLET | Freq: Four times a day (QID) | ORAL | 0 refills | Status: AC | PRN
  Filled 2023-01-02: qty 40, 5d supply, fill #0

## 2023-01-02 MED ORDER — METHOCARBAMOL 500 MG PO TABS
500.0000 mg | ORAL_TABLET | Freq: Four times a day (QID) | ORAL | 0 refills | Status: AC | PRN
  Filled 2023-01-02: qty 60, 8d supply, fill #0

## 2023-01-23 IMAGING — MG MM DIGITAL SCREENING BILAT W/ TOMO AND CAD
8 series · 8 of 24 positions shown · non-contrast
Comparison: Previous exam(s).

CLINICAL DATA: Screening.

EXAM:
DIGITAL SCREENING BILATERAL MAMMOGRAM WITH TOMOSYNTHESIS AND CAD
TECHNIQUE: Bilateral screening digital craniocaudal and mediolateral oblique
mammograms were obtained. Bilateral screening digital breast
tomosynthesis was performed. The images were evaluated with
computer-aided detection.

[L CC synth-2D]
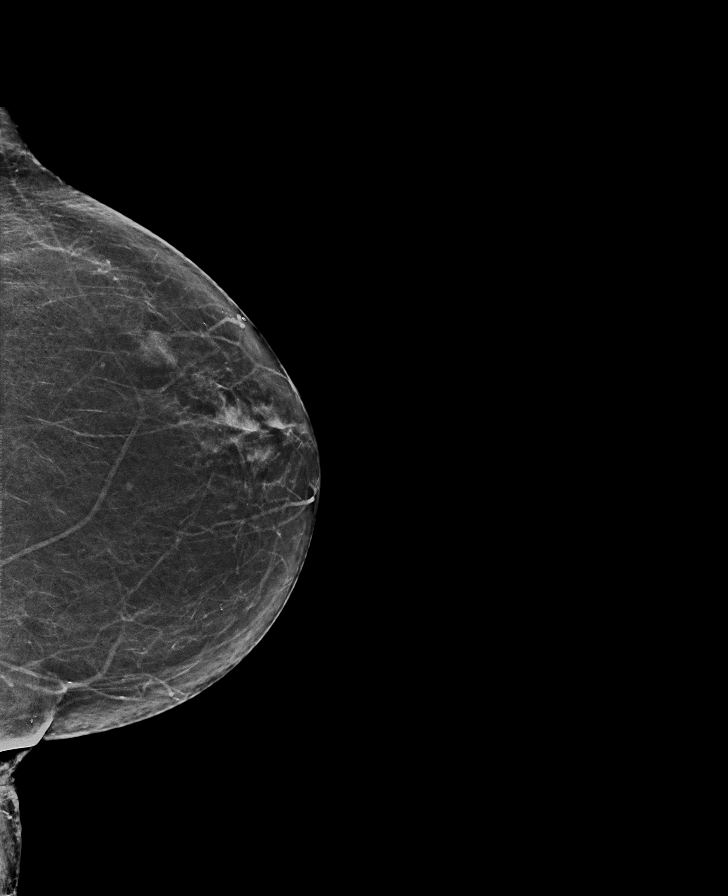

[R CC synth-2D]
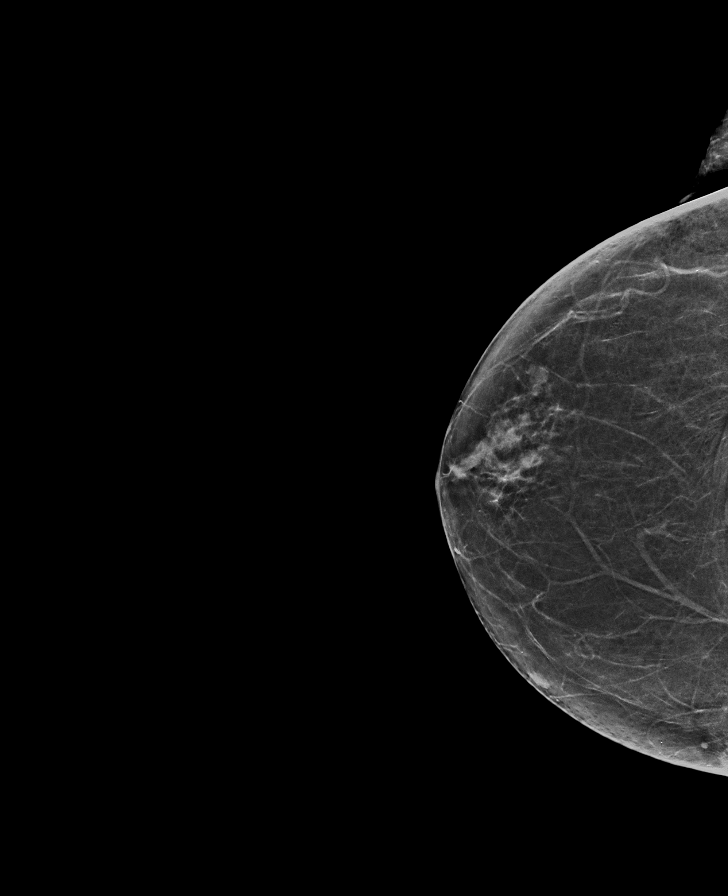

[R MLO synth-2D]
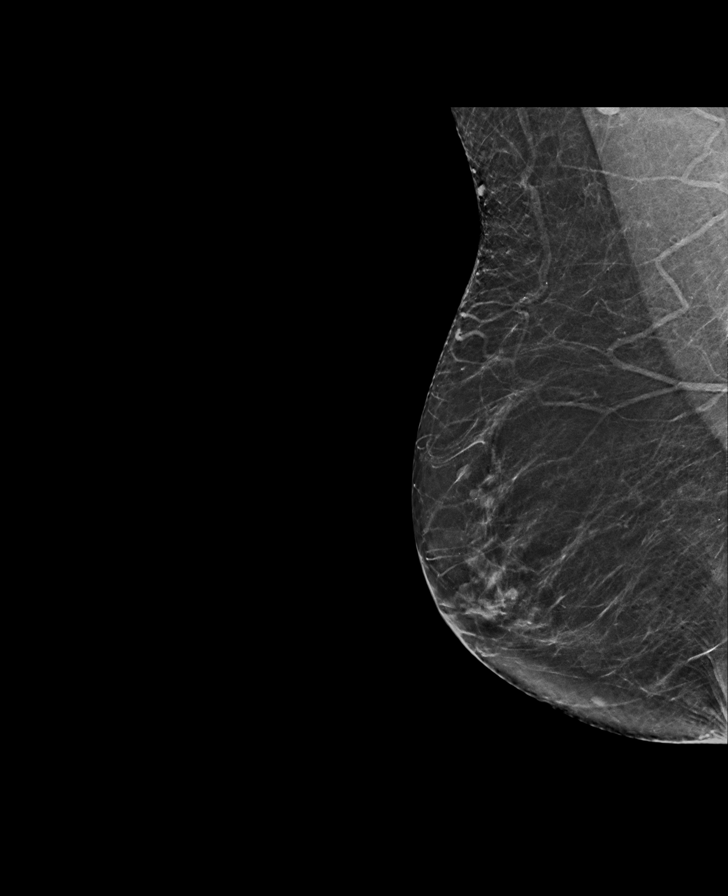

[L MLO synth-2D]
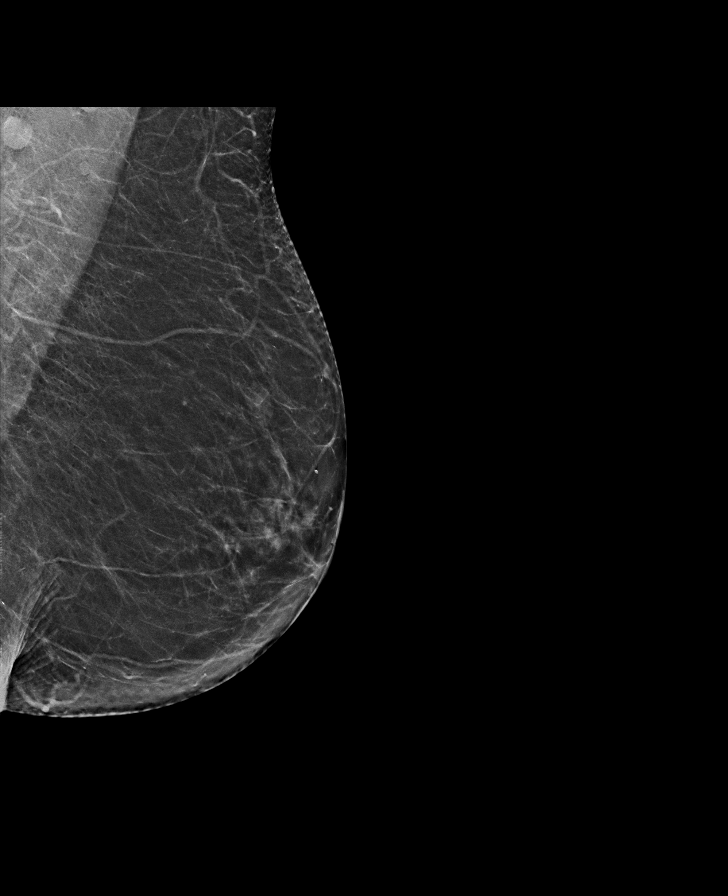

[L MLO tomo · tomo slice 31/62.0]
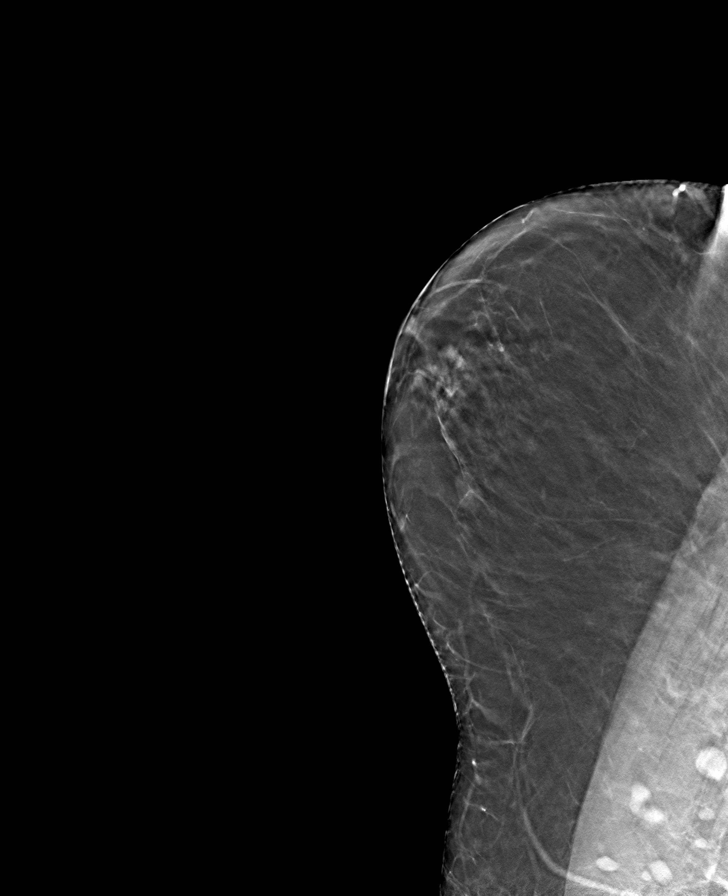

[R MLO tomo · tomo slice 33/64.0]
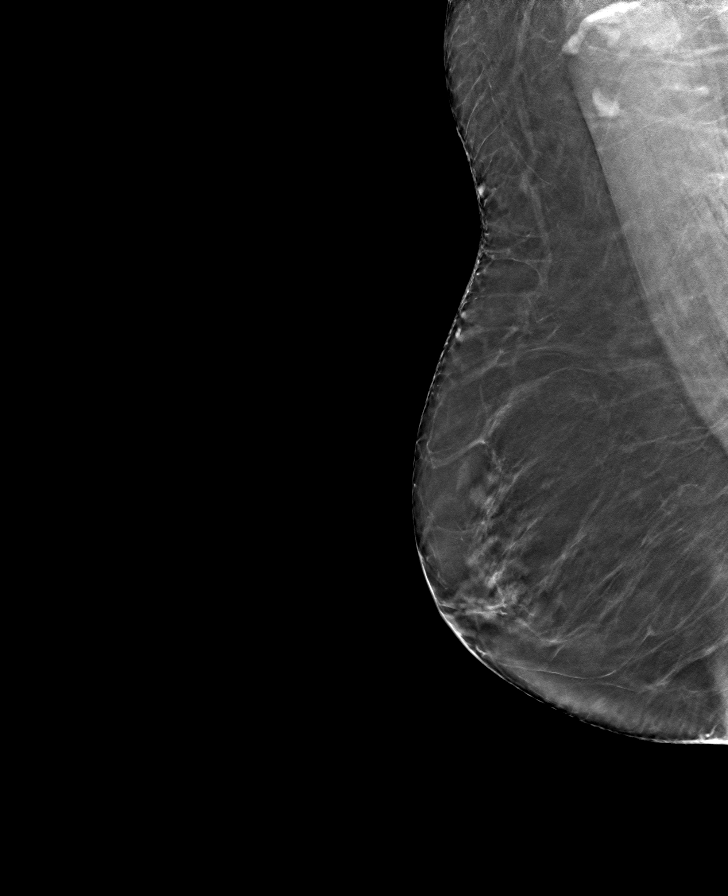

[R CC tomo · tomo slice 31/61.0]
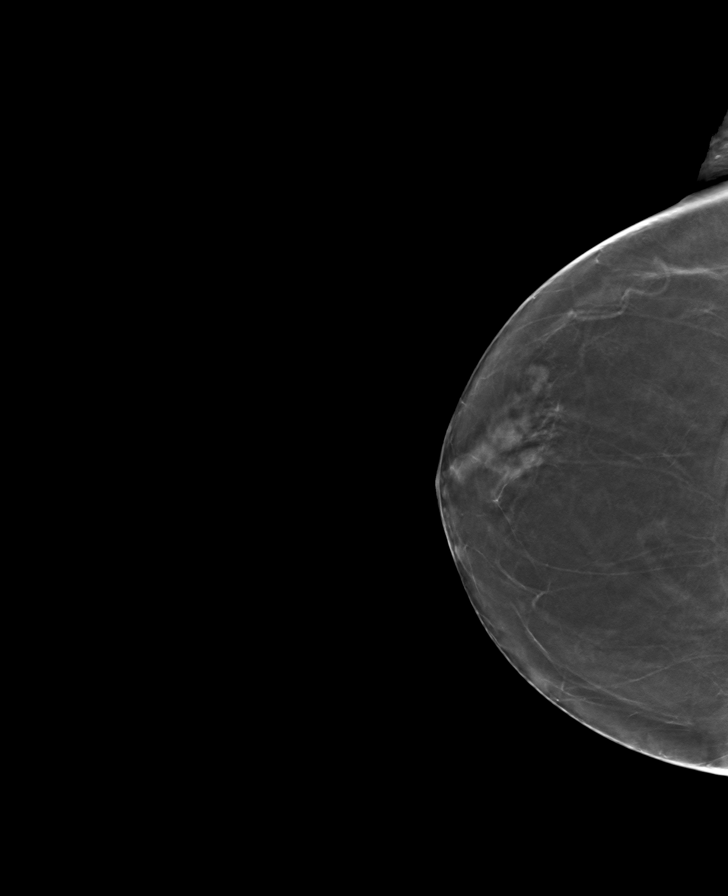

[L CC tomo · tomo slice 33/64.0]
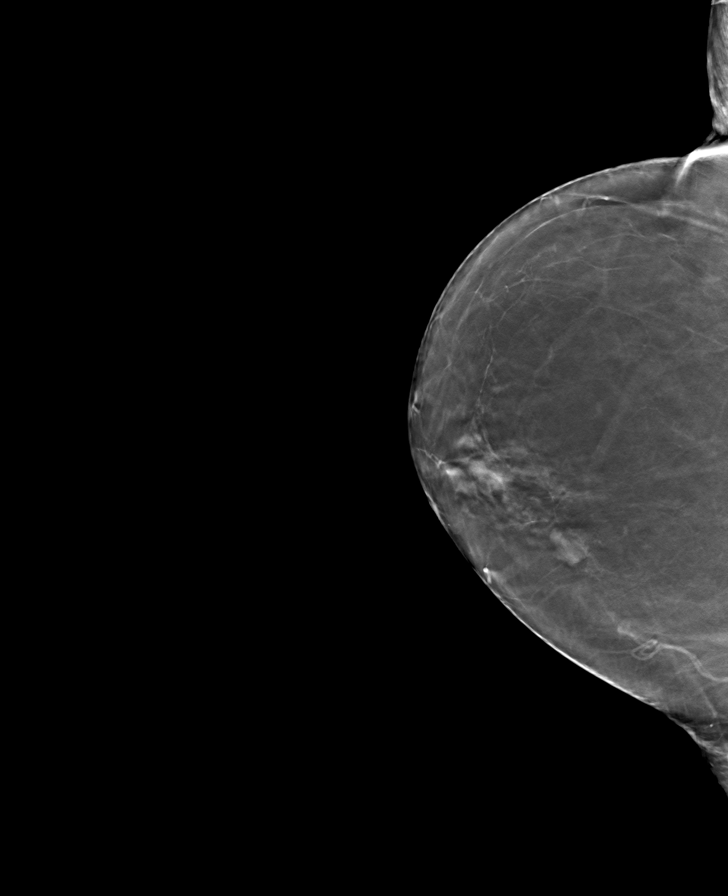

[8 of 24 positions shown; findings below may reference images not displayed]

ACR Breast Density Category b: There are scattered areas of
fibroglandular density.
FINDINGS: There are no findings suspicious for malignancy.
IMPRESSION: No mammographic evidence of malignancy. A result letter of this
screening mammogram will be mailed directly to the patient.

RECOMMENDATION:
Screening mammogram in one year. (Code:51-O-LD2)

BI-RADS CATEGORY  1: Negative.

## 2023-02-07 ENCOUNTER — Other Ambulatory Visit (HOSPITAL_BASED_OUTPATIENT_CLINIC_OR_DEPARTMENT_OTHER): Payer: Self-pay

## 2023-02-07 MED ORDER — AMLODIPINE BESYLATE 5 MG PO TABS
5.0000 mg | ORAL_TABLET | Freq: Every day | ORAL | 3 refills | Status: DC
  Filled 2023-02-07: qty 90, 90d supply, fill #0

## 2023-02-07 MED ORDER — IRBESARTAN 150 MG PO TABS
150.0000 mg | ORAL_TABLET | Freq: Every day | ORAL | 3 refills | Status: DC
  Filled 2023-02-07: qty 90, 90d supply, fill #0

## 2023-05-09 ENCOUNTER — Other Ambulatory Visit (HOSPITAL_BASED_OUTPATIENT_CLINIC_OR_DEPARTMENT_OTHER): Payer: Self-pay

## 2023-05-31 ENCOUNTER — Other Ambulatory Visit (HOSPITAL_BASED_OUTPATIENT_CLINIC_OR_DEPARTMENT_OTHER): Payer: Self-pay

## 2023-05-31 MED ORDER — OXYCODONE HCL 5 MG PO TABS
5.0000 mg | ORAL_TABLET | Freq: Four times a day (QID) | ORAL | 0 refills | Status: AC | PRN
  Filled 2023-05-31: qty 40, 5d supply, fill #0

## 2023-05-31 MED ORDER — ASPIRIN 81 MG PO TBEC
81.0000 mg | DELAYED_RELEASE_TABLET | Freq: Two times a day (BID) | ORAL | 0 refills | Status: AC
  Filled 2023-05-31: qty 120, 60d supply, fill #0

## 2023-05-31 MED ORDER — METHOCARBAMOL 500 MG PO TABS
500.0000 mg | ORAL_TABLET | Freq: Four times a day (QID) | ORAL | 0 refills | Status: AC
  Filled 2023-05-31: qty 60, 8d supply, fill #0

## 2023-05-31 MED ORDER — DOCUSATE SODIUM 100 MG PO CAPS
100.0000 mg | ORAL_CAPSULE | Freq: Two times a day (BID) | ORAL | 0 refills | Status: AC
  Filled 2023-05-31: qty 100, 50d supply, fill #0

## 2023-05-31 MED ORDER — ONDANSETRON 4 MG PO TBDP
4.0000 mg | ORAL_TABLET | Freq: Three times a day (TID) | ORAL | 0 refills | Status: AC | PRN
  Filled 2023-05-31: qty 20, 7d supply, fill #0

## 2023-06-14 ENCOUNTER — Other Ambulatory Visit (HOSPITAL_BASED_OUTPATIENT_CLINIC_OR_DEPARTMENT_OTHER): Payer: Self-pay

## 2023-06-14 MED ORDER — IRBESARTAN 150 MG PO TABS
150.0000 mg | ORAL_TABLET | Freq: Every day | ORAL | 3 refills | Status: DC
  Filled 2023-06-14 – 2023-06-27 (×2): qty 90, 90d supply, fill #0
  Filled 2023-10-18 – 2023-10-31 (×2): qty 90, 90d supply, fill #1
  Filled 2024-01-22 – 2024-02-07 (×2): qty 90, 90d supply, fill #2
  Filled 2024-05-06: qty 90, 90d supply, fill #3

## 2023-06-14 MED ORDER — AMLODIPINE BESYLATE 5 MG PO TABS
5.0000 mg | ORAL_TABLET | Freq: Every day | ORAL | 3 refills | Status: DC
  Filled 2023-06-14 – 2023-06-27 (×2): qty 90, 90d supply, fill #0
  Filled 2023-10-18 – 2023-10-31 (×2): qty 90, 90d supply, fill #1
  Filled 2024-01-22 – 2024-02-07 (×2): qty 90, 90d supply, fill #2
  Filled 2024-05-06: qty 90, 90d supply, fill #3

## 2023-06-21 ENCOUNTER — Other Ambulatory Visit (HOSPITAL_BASED_OUTPATIENT_CLINIC_OR_DEPARTMENT_OTHER): Payer: Self-pay

## 2023-06-27 ENCOUNTER — Other Ambulatory Visit (HOSPITAL_BASED_OUTPATIENT_CLINIC_OR_DEPARTMENT_OTHER): Payer: Self-pay

## 2023-08-24 ENCOUNTER — Other Ambulatory Visit (HOSPITAL_BASED_OUTPATIENT_CLINIC_OR_DEPARTMENT_OTHER): Payer: Self-pay

## 2023-08-24 MED ORDER — AMOXICILLIN 500 MG PO CAPS
2000.0000 mg | ORAL_CAPSULE | Freq: Every day | ORAL | 0 refills | Status: AC
  Filled 2023-08-24: qty 8, 2d supply, fill #0

## 2023-09-25 ENCOUNTER — Other Ambulatory Visit (HOSPITAL_BASED_OUTPATIENT_CLINIC_OR_DEPARTMENT_OTHER): Payer: Self-pay

## 2023-09-26 ENCOUNTER — Other Ambulatory Visit (HOSPITAL_BASED_OUTPATIENT_CLINIC_OR_DEPARTMENT_OTHER): Payer: Self-pay

## 2023-10-30 ENCOUNTER — Other Ambulatory Visit (HOSPITAL_BASED_OUTPATIENT_CLINIC_OR_DEPARTMENT_OTHER): Payer: Self-pay

## 2024-01-26 ENCOUNTER — Other Ambulatory Visit (HOSPITAL_BASED_OUTPATIENT_CLINIC_OR_DEPARTMENT_OTHER): Payer: Self-pay

## 2024-02-06 ENCOUNTER — Other Ambulatory Visit (HOSPITAL_BASED_OUTPATIENT_CLINIC_OR_DEPARTMENT_OTHER): Payer: Self-pay

## 2024-02-06 MED ORDER — CLOTRIMAZOLE-BETAMETHASONE 1-0.05 % EX CREA
TOPICAL_CREAM | Freq: Two times a day (BID) | CUTANEOUS | 1 refills | Status: AC
  Filled 2024-02-06: qty 45, 30d supply, fill #0
  Filled 2024-03-01: qty 45, 30d supply, fill #1

## 2024-02-07 ENCOUNTER — Other Ambulatory Visit (HOSPITAL_BASED_OUTPATIENT_CLINIC_OR_DEPARTMENT_OTHER): Payer: Self-pay

## 2024-03-04 ENCOUNTER — Other Ambulatory Visit (HOSPITAL_BASED_OUTPATIENT_CLINIC_OR_DEPARTMENT_OTHER): Payer: Self-pay

## 2024-05-06 ENCOUNTER — Other Ambulatory Visit (HOSPITAL_BASED_OUTPATIENT_CLINIC_OR_DEPARTMENT_OTHER): Payer: Self-pay

## 2024-07-29 ENCOUNTER — Other Ambulatory Visit (HOSPITAL_BASED_OUTPATIENT_CLINIC_OR_DEPARTMENT_OTHER): Payer: Self-pay

## 2024-07-29 MED ORDER — OMEPRAZOLE 40 MG PO CPDR
40.0000 mg | DELAYED_RELEASE_CAPSULE | Freq: Every day | ORAL | 0 refills | Status: DC
  Filled 2024-07-29: qty 30, 30d supply, fill #0

## 2024-07-29 MED ORDER — AMOXICILLIN-POT CLAVULANATE 875-125 MG PO TABS
1.0000 | ORAL_TABLET | Freq: Two times a day (BID) | ORAL | 0 refills | Status: AC
  Filled 2024-07-29: qty 20, 10d supply, fill #0

## 2024-07-30 ENCOUNTER — Other Ambulatory Visit (HOSPITAL_BASED_OUTPATIENT_CLINIC_OR_DEPARTMENT_OTHER): Payer: Self-pay

## 2024-07-30 MED ORDER — AMLODIPINE BESYLATE 5 MG PO TABS: 5.0000 mg | ORAL_TABLET | Freq: Every day | ORAL | 0 refills | Status: AC

## 2024-07-30 MED ORDER — IRBESARTAN 150 MG PO TABS: 150.0000 mg | ORAL_TABLET | Freq: Every day | ORAL | 0 refills | Status: AC

## 2024-08-19 ENCOUNTER — Other Ambulatory Visit (HOSPITAL_BASED_OUTPATIENT_CLINIC_OR_DEPARTMENT_OTHER): Payer: Self-pay

## 2024-08-19 ENCOUNTER — Other Ambulatory Visit: Payer: Self-pay

## 2024-08-19 MED ORDER — IRBESARTAN 150 MG PO TABS
150.0000 mg | ORAL_TABLET | Freq: Every day | ORAL | 3 refills | Status: AC
  Filled 2024-08-19: qty 90, 90d supply, fill #0
  Filled 2024-11-27: qty 90, 90d supply, fill #1

## 2024-08-19 MED ORDER — OMEPRAZOLE 40 MG PO CPDR
40.0000 mg | DELAYED_RELEASE_CAPSULE | Freq: Every day | ORAL | 11 refills | Status: AC
  Filled 2024-08-19 – 2024-08-21 (×2): qty 30, 30d supply, fill #0
  Filled 2024-09-20: qty 30, 30d supply, fill #1
  Filled 2024-10-21: qty 30, 30d supply, fill #2

## 2024-08-19 MED ORDER — AMLODIPINE BESYLATE 5 MG PO TABS
5.0000 mg | ORAL_TABLET | Freq: Every day | ORAL | 3 refills | Status: AC
  Filled 2024-08-19: qty 90, 90d supply, fill #0
  Filled 2024-11-27: qty 90, 90d supply, fill #1

## 2024-08-21 ENCOUNTER — Other Ambulatory Visit: Payer: Self-pay

## 2024-08-21 ENCOUNTER — Other Ambulatory Visit (HOSPITAL_BASED_OUTPATIENT_CLINIC_OR_DEPARTMENT_OTHER): Payer: Self-pay

## 2024-09-04 ENCOUNTER — Other Ambulatory Visit (HOSPITAL_BASED_OUTPATIENT_CLINIC_OR_DEPARTMENT_OTHER): Payer: Self-pay

## 2024-09-04 ENCOUNTER — Encounter: Payer: Self-pay | Admitting: Family Medicine

## 2024-09-04 DIAGNOSIS — C4490 Unspecified malignant neoplasm of skin, unspecified: Secondary | ICD-10-CM | POA: Insufficient documentation

## 2024-09-04 MED ORDER — CEPHALEXIN 500 MG PO CAPS
ORAL_CAPSULE | ORAL | 0 refills | Status: AC
  Filled 2024-09-04: qty 19, 6d supply, fill #0

## 2024-09-24 ENCOUNTER — Other Ambulatory Visit (HOSPITAL_BASED_OUTPATIENT_CLINIC_OR_DEPARTMENT_OTHER): Payer: Self-pay

## 2024-10-28 ENCOUNTER — Other Ambulatory Visit (HOSPITAL_BASED_OUTPATIENT_CLINIC_OR_DEPARTMENT_OTHER): Payer: Self-pay

## 2024-11-27 ENCOUNTER — Other Ambulatory Visit (HOSPITAL_BASED_OUTPATIENT_CLINIC_OR_DEPARTMENT_OTHER): Payer: Self-pay
